# Patient Record
Sex: Female | Born: 1940 | Race: White | Hispanic: No | Smoking: Never smoker
Health system: Southern US, Community
[De-identification: ages and names within clinical notes are randomized; demographics above are authoritative.]

## PROBLEM LIST (undated history)

## (undated) DIAGNOSIS — E039 Hypothyroidism, unspecified: Secondary | ICD-10-CM

## (undated) DIAGNOSIS — J189 Pneumonia, unspecified organism: Secondary | ICD-10-CM

## (undated) DIAGNOSIS — F329 Major depressive disorder, single episode, unspecified: Secondary | ICD-10-CM

## (undated) DIAGNOSIS — F32A Depression, unspecified: Secondary | ICD-10-CM

## (undated) DIAGNOSIS — E7800 Pure hypercholesterolemia, unspecified: Secondary | ICD-10-CM

## (undated) DIAGNOSIS — Z8719 Personal history of other diseases of the digestive system: Secondary | ICD-10-CM

## (undated) DIAGNOSIS — R011 Cardiac murmur, unspecified: Secondary | ICD-10-CM

## (undated) DIAGNOSIS — R0600 Dyspnea, unspecified: Secondary | ICD-10-CM

## (undated) DIAGNOSIS — M797 Fibromyalgia: Secondary | ICD-10-CM

## (undated) DIAGNOSIS — K589 Irritable bowel syndrome without diarrhea: Secondary | ICD-10-CM

## (undated) DIAGNOSIS — H9190 Unspecified hearing loss, unspecified ear: Secondary | ICD-10-CM

## (undated) DIAGNOSIS — K219 Gastro-esophageal reflux disease without esophagitis: Secondary | ICD-10-CM

## (undated) DIAGNOSIS — I10 Essential (primary) hypertension: Secondary | ICD-10-CM

## (undated) DIAGNOSIS — M810 Age-related osteoporosis without current pathological fracture: Secondary | ICD-10-CM

## (undated) DIAGNOSIS — M1990 Unspecified osteoarthritis, unspecified site: Secondary | ICD-10-CM

## (undated) DIAGNOSIS — D649 Anemia, unspecified: Secondary | ICD-10-CM

## (undated) HISTORY — PX: CHOLECYSTECTOMY: SHX55

## (undated) HISTORY — PX: ABDOMINAL HYSTERECTOMY: SHX81

---

## 2005-02-15 ENCOUNTER — Ambulatory Visit: Payer: Self-pay | Admitting: Nurse Practitioner

## 2005-11-29 ENCOUNTER — Ambulatory Visit: Payer: Self-pay

## 2006-03-30 ENCOUNTER — Ambulatory Visit: Payer: Self-pay | Admitting: Nurse Practitioner

## 2006-07-01 ENCOUNTER — Emergency Department: Payer: Self-pay | Admitting: Emergency Medicine

## 2006-07-02 ENCOUNTER — Inpatient Hospital Stay: Payer: Self-pay

## 2006-07-02 ENCOUNTER — Other Ambulatory Visit: Payer: Self-pay

## 2006-07-20 ENCOUNTER — Ambulatory Visit: Payer: Self-pay

## 2006-08-23 ENCOUNTER — Ambulatory Visit: Payer: Self-pay | Admitting: Internal Medicine

## 2006-08-28 ENCOUNTER — Ambulatory Visit: Payer: Self-pay | Admitting: Internal Medicine

## 2006-10-03 ENCOUNTER — Ambulatory Visit: Payer: Self-pay | Admitting: Gastroenterology

## 2007-01-10 ENCOUNTER — Ambulatory Visit: Payer: Self-pay | Admitting: Nurse Practitioner

## 2008-01-17 ENCOUNTER — Ambulatory Visit: Payer: Self-pay | Admitting: Unknown Physician Specialty

## 2008-03-13 ENCOUNTER — Other Ambulatory Visit: Payer: Self-pay

## 2008-03-13 ENCOUNTER — Ambulatory Visit: Payer: Self-pay | Admitting: Surgery

## 2008-03-23 ENCOUNTER — Ambulatory Visit: Payer: Self-pay | Admitting: Surgery

## 2008-07-07 ENCOUNTER — Ambulatory Visit: Payer: Self-pay | Admitting: Internal Medicine

## 2008-07-13 ENCOUNTER — Ambulatory Visit: Payer: Self-pay | Admitting: Internal Medicine

## 2008-08-10 ENCOUNTER — Inpatient Hospital Stay: Payer: Self-pay | Admitting: Internal Medicine

## 2008-09-06 ENCOUNTER — Inpatient Hospital Stay: Payer: Self-pay | Admitting: Internal Medicine

## 2008-09-07 ENCOUNTER — Ambulatory Visit: Payer: Self-pay | Admitting: Cardiology

## 2009-01-18 ENCOUNTER — Ambulatory Visit: Payer: Self-pay | Admitting: Internal Medicine

## 2009-02-01 ENCOUNTER — Inpatient Hospital Stay: Payer: Self-pay | Admitting: Internal Medicine

## 2009-02-24 ENCOUNTER — Ambulatory Visit: Payer: Self-pay | Admitting: Oncology

## 2009-03-18 LAB — CMP (CANCER CENTER ONLY)
ALT(SGPT): 17 U/L (ref 10–47)
Albumin: 3.8 g/dL (ref 3.3–5.5)
Alkaline Phosphatase: 110 U/L — ABNORMAL HIGH (ref 26–84)
CO2: 27 mEq/L (ref 18–33)
Potassium: 4.3 mEq/L (ref 3.3–4.7)
Sodium: 139 mEq/L (ref 128–145)
Total Bilirubin: 0.5 mg/dl (ref 0.20–1.60)
Total Protein: 7.1 g/dL (ref 6.4–8.1)

## 2009-03-18 LAB — CBC WITH DIFFERENTIAL (CANCER CENTER ONLY)
BASO%: 0.6 % (ref 0.0–2.0)
Eosinophils Absolute: 0 10*3/uL (ref 0.0–0.5)
HCT: 35.2 % (ref 34.8–46.6)
HGB: 12.1 g/dL (ref 11.6–15.9)
LYMPH#: 2.2 10*3/uL (ref 0.9–3.3)
MONO#: 0.4 10*3/uL (ref 0.1–0.9)
NEUT%: 51.1 % (ref 39.6–80.0)
RBC: 4.61 10*6/uL (ref 3.70–5.32)
RDW: 22.1 % — ABNORMAL HIGH (ref 10.5–14.6)
WBC: 5.4 10*3/uL (ref 3.9–10.0)

## 2009-03-18 LAB — MORPHOLOGY - CHCC SATELLITE: Platelet Morphology: NORMAL

## 2009-03-19 LAB — RETICULOCYTES (CHCC)
ABS Retic: 39.2 10*3/uL (ref 19.0–186.0)
RBC.: 4.36 MIL/uL (ref 3.87–5.11)
Retic Ct Pct: 0.9 % (ref 0.4–3.1)

## 2009-03-19 LAB — IRON AND TIBC: %SAT: 15 % — ABNORMAL LOW (ref 20–55)

## 2009-04-26 ENCOUNTER — Ambulatory Visit: Payer: Self-pay | Admitting: Oncology

## 2009-04-28 LAB — CBC WITH DIFFERENTIAL (CANCER CENTER ONLY)
BASO%: 0.6 % (ref 0.0–2.0)
LYMPH#: 2.3 10*3/uL (ref 0.9–3.3)
LYMPH%: 36.9 % (ref 14.0–48.0)
MONO#: 0.3 10*3/uL (ref 0.1–0.9)
NEUT#: 3.6 10*3/uL (ref 1.5–6.5)
Platelets: 254 10*3/uL (ref 145–400)
RBC: 4.08 10*6/uL (ref 3.70–5.32)
RDW: 15.8 % — ABNORMAL HIGH (ref 10.5–14.6)
WBC: 6.3 10*3/uL (ref 3.9–10.0)

## 2009-04-30 LAB — PROTEIN ELECTROPHORESIS, SERUM
Alpha-2-Globulin: 13.7 % — ABNORMAL HIGH (ref 7.1–11.8)
Beta 2: 3.4 % (ref 3.2–6.5)
Beta Globulin: 6.6 % (ref 4.7–7.2)
Total Protein, Serum Electrophoresis: 7.3 g/dL (ref 6.0–8.3)

## 2009-06-21 ENCOUNTER — Ambulatory Visit: Payer: Self-pay | Admitting: Gastroenterology

## 2009-08-30 ENCOUNTER — Ambulatory Visit: Payer: Self-pay | Admitting: Gastroenterology

## 2009-08-31 ENCOUNTER — Ambulatory Visit: Payer: Self-pay | Admitting: Gastroenterology

## 2009-09-02 ENCOUNTER — Ambulatory Visit: Payer: Self-pay | Admitting: Gastroenterology

## 2009-09-07 ENCOUNTER — Ambulatory Visit: Payer: Self-pay | Admitting: Gastroenterology

## 2010-01-08 IMAGING — CR DG CHEST 1V PORT
1 series · 1 of 1 positions shown · non-contrast
Comparison: none

REASON FOR EXAM: Chest Pain
COMMENTS:

[view not recorded]
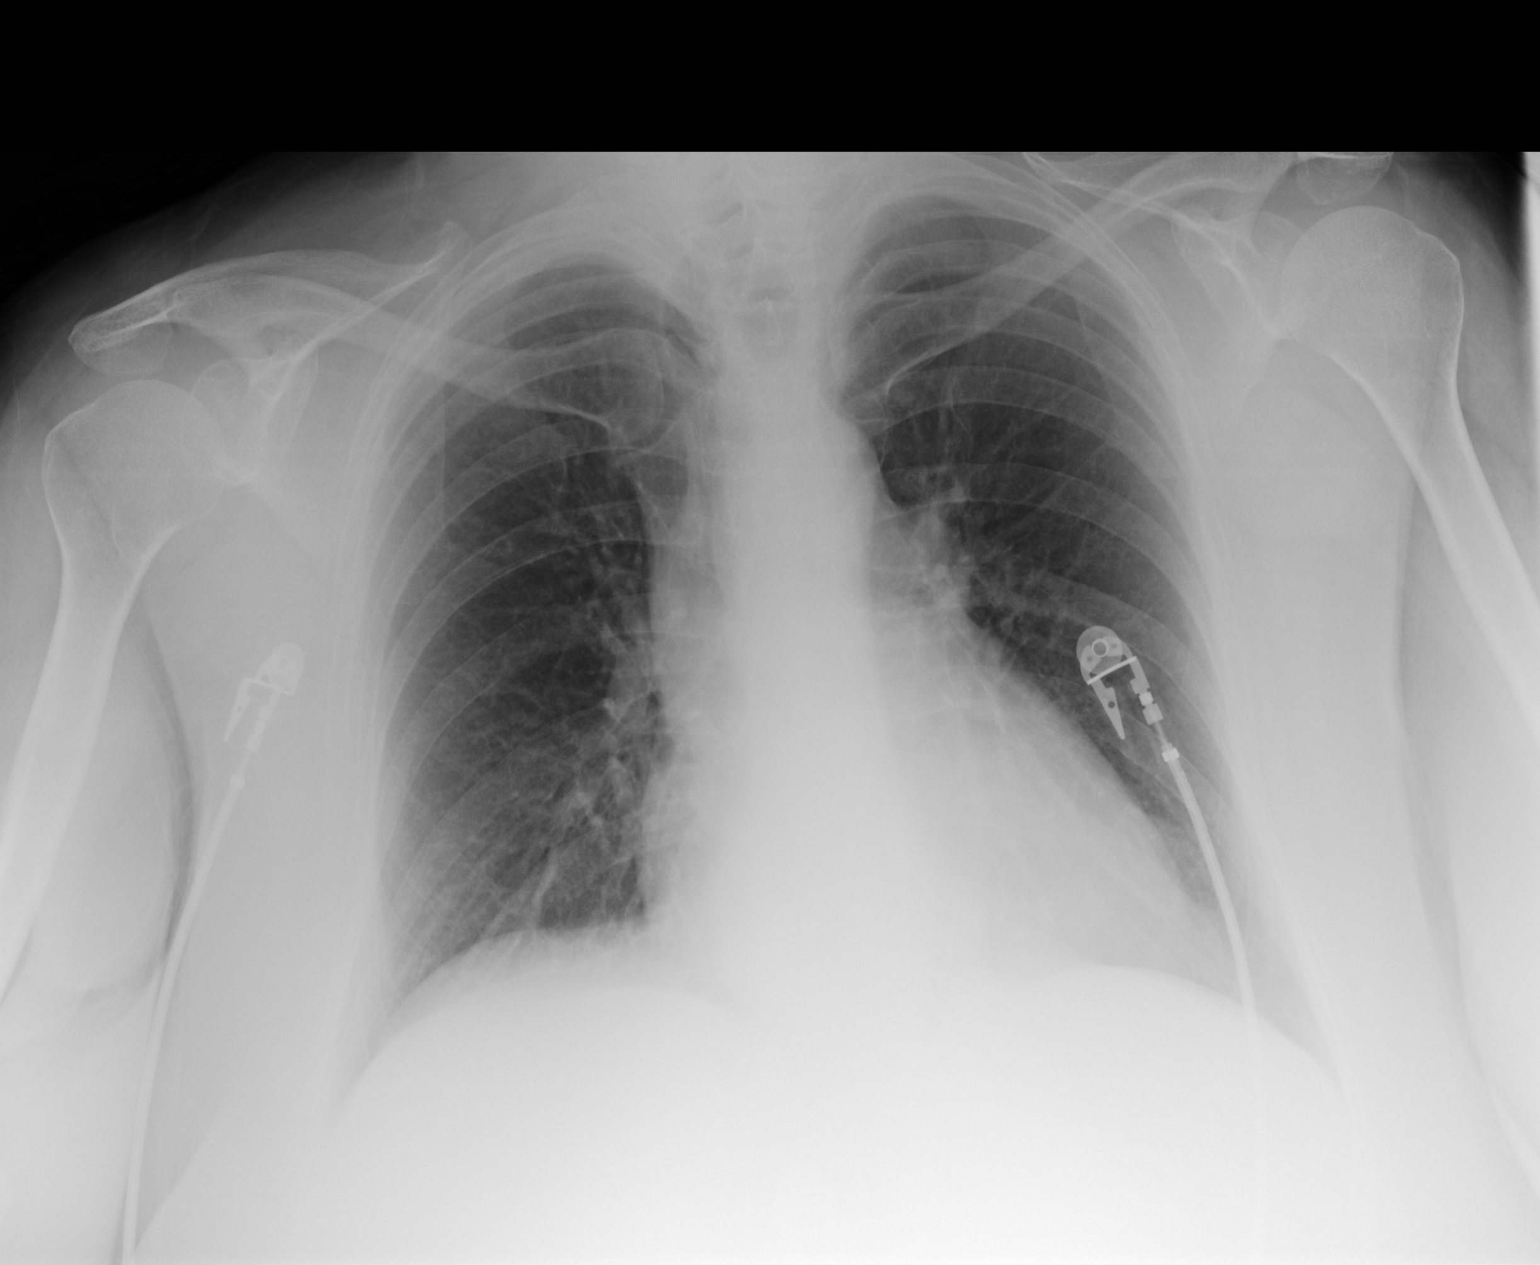

[1 of 1 positions shown; findings below may reference images not displayed]

PROCEDURE:     DXR - DXR PORTABLE CHEST SINGLE VIEW  - January 31, 2009  [DATE]

RESULT:     Comparison is made to the exam of the [DATE].

Cardiac monitoring electrodes are present. The lungs are clear. The heart
and pulmonary vessels are normal. The bony and mediastinal structures are
unremarkable. There is no effusion. There is no pneumothorax or evidence of
congestive failure.
IMPRESSION: No acute cardiopulmonary disease.

## 2010-02-04 ENCOUNTER — Ambulatory Visit: Payer: Self-pay | Admitting: Internal Medicine

## 2010-04-18 ENCOUNTER — Ambulatory Visit: Payer: Self-pay | Admitting: Oncology

## 2010-07-20 ENCOUNTER — Ambulatory Visit: Payer: Self-pay | Admitting: Ophthalmology

## 2010-07-29 ENCOUNTER — Ambulatory Visit: Payer: Self-pay | Admitting: Cardiovascular Disease

## 2010-08-01 ENCOUNTER — Ambulatory Visit: Payer: Self-pay | Admitting: Ophthalmology

## 2011-02-07 ENCOUNTER — Ambulatory Visit: Payer: Self-pay | Admitting: Internal Medicine

## 2012-01-01 ENCOUNTER — Emergency Department: Payer: Self-pay | Admitting: *Deleted

## 2012-01-01 LAB — COMPREHENSIVE METABOLIC PANEL
Anion Gap: 13 (ref 7–16)
Calcium, Total: 8.8 mg/dL (ref 8.5–10.1)
Co2: 18 mmol/L — ABNORMAL LOW (ref 21–32)
EGFR (African American): 55 — ABNORMAL LOW
EGFR (Non-African Amer.): 45 — ABNORMAL LOW
Glucose: 96 mg/dL (ref 65–99)
Osmolality: 293 (ref 275–301)
Potassium: 4.5 mmol/L (ref 3.5–5.1)
SGOT(AST): 17 U/L (ref 15–37)
Sodium: 146 mmol/L — ABNORMAL HIGH (ref 136–145)

## 2012-01-01 LAB — URINALYSIS, COMPLETE
Blood: NEGATIVE
Ketone: NEGATIVE
Ph: 5 (ref 4.5–8.0)
Protein: 30
RBC,UR: 3 /HPF (ref 0–5)

## 2012-01-01 LAB — CBC
MCHC: 33.1 g/dL (ref 32.0–36.0)
RDW: 12.7 % (ref 11.5–14.5)
WBC: 6.3 10*3/uL (ref 3.6–11.0)

## 2012-01-18 ENCOUNTER — Inpatient Hospital Stay: Payer: Self-pay | Admitting: Internal Medicine

## 2012-01-18 LAB — CBC
HCT: 28.7 % — ABNORMAL LOW (ref 35.0–47.0)
HGB: 9.7 g/dL — ABNORMAL LOW (ref 12.0–16.0)
MCH: 31.5 pg (ref 26.0–34.0)
MCHC: 33.8 g/dL (ref 32.0–36.0)
RBC: 3.07 10*6/uL — ABNORMAL LOW (ref 3.80–5.20)
WBC: 7.6 10*3/uL (ref 3.6–11.0)

## 2012-01-18 LAB — COMPREHENSIVE METABOLIC PANEL
Albumin: 3 g/dL — ABNORMAL LOW (ref 3.4–5.0)
Alkaline Phosphatase: 86 U/L (ref 50–136)
Bilirubin,Total: 0.4 mg/dL (ref 0.2–1.0)
Calcium, Total: 7.5 mg/dL — ABNORMAL LOW (ref 8.5–10.1)
Creatinine: 4.65 mg/dL — ABNORMAL HIGH (ref 0.60–1.30)
EGFR (African American): 12 — ABNORMAL LOW
SGOT(AST): 11 U/L — ABNORMAL LOW (ref 15–37)
SGPT (ALT): 10 U/L — ABNORMAL LOW
Sodium: 138 mmol/L (ref 136–145)
Total Protein: 6.2 g/dL — ABNORMAL LOW (ref 6.4–8.2)

## 2012-01-18 LAB — TROPONIN I: Troponin-I: <0.02 ng/mL

## 2012-01-18 LAB — URINALYSIS, COMPLETE
Bilirubin,UR: NEGATIVE
Blood: NEGATIVE
Hyaline Cast: 11
Ketone: NEGATIVE
Nitrite: NEGATIVE
Ph: 5 (ref 4.5–8.0)
Protein: NEGATIVE
RBC,UR: 1 /HPF (ref 0–5)
Specific Gravity: 1.012 (ref 1.003–1.030)
Squamous Epithelial: NONE SEEN

## 2012-01-18 LAB — CK TOTAL AND CKMB (NOT AT ARMC)
CK, Total: 25 U/L (ref 21–215)
CK-MB: 0.7 ng/mL (ref 0.5–3.6)

## 2012-01-18 LAB — LIPASE, BLOOD: Lipase: 51 U/L — ABNORMAL LOW (ref 73–393)

## 2012-01-19 LAB — CBC WITH DIFFERENTIAL/PLATELET
Basophil #: 0 10*3/uL (ref 0.0–0.1)
Basophil %: 0.5 %
Eosinophil #: 0 10*3/uL (ref 0.0–0.7)
HGB: 8.9 g/dL — ABNORMAL LOW (ref 12.0–16.0)
Lymphocyte %: 18.2 %
MCH: 31.2 pg (ref 26.0–34.0)
MCHC: 33.1 g/dL (ref 32.0–36.0)
Monocyte #: 0.4 10*3/uL (ref 0.0–0.7)
Neutrophil %: 76.8 %
Platelet: 153 10*3/uL (ref 150–440)
RBC: 2.86 10*6/uL — ABNORMAL LOW (ref 3.80–5.20)
WBC: 9.7 10*3/uL (ref 3.6–11.0)

## 2012-01-19 LAB — BASIC METABOLIC PANEL
Anion Gap: 19 — ABNORMAL HIGH (ref 7–16)
Anion Gap: 10 (ref 7–16)
Calcium, Total: 7 mg/dL — CL (ref 8.5–10.1)
Calcium, Total: 6.9 mg/dL — CL (ref 8.5–10.1)
Chloride: 118 mmol/L — ABNORMAL HIGH (ref 98–107)
Chloride: 113 mmol/L — ABNORMAL HIGH (ref 98–107)
Co2: 11 mmol/L — ABNORMAL LOW (ref 21–32)
Co2: 20 mmol/L — ABNORMAL LOW (ref 21–32)
EGFR (African American): 25 — ABNORMAL LOW
EGFR (Non-African Amer.): 21 — ABNORMAL LOW
Glucose: 90 mg/dL (ref 65–99)
Osmolality: 301 (ref 275–301)
Osmolality: 292 (ref 275–301)
Potassium: 3.6 mmol/L (ref 3.5–5.1)

## 2012-01-19 LAB — CLOSTRIDIUM DIFFICILE BY PCR

## 2012-01-20 LAB — CBC WITH DIFFERENTIAL/PLATELET
Basophil #: 0 10*3/uL (ref 0.0–0.1)
Basophil %: 0.4 %
Eosinophil #: 0 10*3/uL (ref 0.0–0.7)
Eosinophil %: 0 %
HCT: 24.8 % — ABNORMAL LOW (ref 35.0–47.0)
HGB: 8.4 g/dL — ABNORMAL LOW (ref 12.0–16.0)
Lymphocyte #: 1.7 10*3/uL (ref 1.0–3.6)
Lymphocyte %: 19.6 %
MCH: 31 pg (ref 26.0–34.0)
MCHC: 33.7 g/dL (ref 32.0–36.0)
MCV: 92 fL (ref 80–100)
Monocyte #: 0.5 10*3/uL (ref 0.0–0.7)
Monocyte %: 5.1 %
Neutrophil #: 6.7 10*3/uL — ABNORMAL HIGH (ref 1.4–6.5)
Neutrophil %: 74.9 %
Platelet: 123 10*3/uL — ABNORMAL LOW (ref 150–440)
RBC: 2.7 10*6/uL — ABNORMAL LOW (ref 3.80–5.20)
RDW: 11.7 % (ref 11.5–14.5)
WBC: 8.9 10*3/uL (ref 3.6–11.0)

## 2012-01-20 LAB — MAGNESIUM
Magnesium: 0.7 mg/dL — ABNORMAL LOW
Magnesium: 1.4 mg/dL — ABNORMAL LOW

## 2012-01-20 LAB — BASIC METABOLIC PANEL
Anion Gap: 11 (ref 7–16)
BUN: 12 mg/dL (ref 7–18)
Calcium, Total: 6.6 mg/dL — CL (ref 8.5–10.1)
Chloride: 108 mmol/L — ABNORMAL HIGH (ref 98–107)
Co2: 26 mmol/L (ref 21–32)
Creatinine: 1.03 mg/dL (ref 0.60–1.30)
EGFR (African American): >60
EGFR (Non-African Amer.): 56 — ABNORMAL LOW
Glucose: 117 mg/dL — ABNORMAL HIGH (ref 65–99)
Osmolality: 289 (ref 275–301)
Potassium: 3.4 mmol/L — ABNORMAL LOW (ref 3.5–5.1)
Sodium: 145 mmol/L (ref 136–145)

## 2012-01-20 LAB — PROTIME-INR
INR: 1.4
Prothrombin Time: 17.9 secs — ABNORMAL HIGH (ref 11.5–14.7)

## 2012-01-20 LAB — APTT: Activated PTT: 43.3 secs — ABNORMAL HIGH (ref 23.6–35.9)

## 2012-01-20 LAB — PHOSPHORUS
Phosphorus: 1 mg/dL — CL (ref 2.5–4.9)
Phosphorus: 2.6 mg/dL (ref 2.5–4.9)

## 2012-01-20 LAB — POTASSIUM: Potassium: 2.9 mmol/L — ABNORMAL LOW (ref 3.5–5.1)

## 2012-01-20 LAB — CALCIUM: Calcium, Total: 6.8 mg/dL — CL (ref 8.5–10.1)

## 2012-01-20 LAB — WBCS, STOOL

## 2012-01-21 LAB — CBC WITH DIFFERENTIAL/PLATELET
Basophil #: 0 10*3/uL (ref 0.0–0.1)
Basophil %: 0.3 %
Eosinophil #: 0 10*3/uL (ref 0.0–0.7)
Eosinophil %: 0.1 %
HCT: 25.5 % — ABNORMAL LOW (ref 35.0–47.0)
HGB: 8.7 g/dL — ABNORMAL LOW (ref 12.0–16.0)
Lymphocyte #: 1.2 10*3/uL (ref 1.0–3.6)
Lymphocyte %: 14.7 %
MCH: 31.6 pg (ref 26.0–34.0)
MCHC: 34 g/dL (ref 32.0–36.0)
MCV: 93 fL (ref 80–100)
Monocyte #: 0.4 10*3/uL (ref 0.0–0.7)
Monocyte %: 5.2 %
Neutrophil #: 6.8 10*3/uL — ABNORMAL HIGH (ref 1.4–6.5)
Neutrophil %: 79.7 %
Platelet: 119 10*3/uL — ABNORMAL LOW (ref 150–440)
RBC: 2.74 10*6/uL — ABNORMAL LOW (ref 3.80–5.20)
RDW: 12.5 % (ref 11.5–14.5)
WBC: 8.5 10*3/uL (ref 3.6–11.0)

## 2012-01-21 LAB — COMPREHENSIVE METABOLIC PANEL
Albumin: 2.3 g/dL — ABNORMAL LOW (ref 3.4–5.0)
Alkaline Phosphatase: 76 U/L (ref 50–136)
Anion Gap: 10 (ref 7–16)
BUN: 9 mg/dL (ref 7–18)
Bilirubin,Total: 0.3 mg/dL (ref 0.2–1.0)
Calcium, Total: 6.8 mg/dL — CL (ref 8.5–10.1)
Chloride: 106 mmol/L (ref 98–107)
Co2: 28 mmol/L (ref 21–32)
Creatinine: 0.98 mg/dL (ref 0.60–1.30)
EGFR (African American): >60
EGFR (Non-African Amer.): 60 — ABNORMAL LOW
Glucose: 122 mg/dL — ABNORMAL HIGH (ref 65–99)
Osmolality: 287 (ref 275–301)
Potassium: 3.4 mmol/L — ABNORMAL LOW (ref 3.5–5.1)
SGOT(AST): 17 U/L (ref 15–37)
SGPT (ALT): 7 U/L — ABNORMAL LOW
Sodium: 144 mmol/L (ref 136–145)
Total Protein: 5.6 g/dL — ABNORMAL LOW (ref 6.4–8.2)

## 2012-01-21 LAB — MAGNESIUM: Magnesium: 1.7 mg/dL — ABNORMAL LOW

## 2012-01-21 LAB — PHOSPHORUS: Phosphorus: 1.5 mg/dL — ABNORMAL LOW (ref 2.5–4.9)

## 2012-01-23 LAB — CBC WITH DIFFERENTIAL/PLATELET
Basophil %: 0.4 %
Eosinophil #: 0 10*3/uL (ref 0.0–0.7)
Eosinophil %: 0.1 %
HCT: 25.6 % — ABNORMAL LOW (ref 35.0–47.0)
Lymphocyte #: 1.5 10*3/uL (ref 1.0–3.6)
MCH: 31.3 pg (ref 26.0–34.0)
MCHC: 33.3 g/dL (ref 32.0–36.0)
Monocyte #: 0.6 10*3/uL (ref 0.0–0.7)
Monocyte %: 7.1 %
Neutrophil #: 6.2 10*3/uL (ref 1.4–6.5)
Platelet: 131 10*3/uL — ABNORMAL LOW (ref 150–440)
RBC: 2.72 10*6/uL — ABNORMAL LOW (ref 3.80–5.20)

## 2012-01-23 LAB — PATHOLOGY REPORT

## 2012-01-23 LAB — BASIC METABOLIC PANEL
Anion Gap: 9 (ref 7–16)
BUN: 9 mg/dL (ref 7–18)
Calcium, Total: 7.8 mg/dL — ABNORMAL LOW (ref 8.5–10.1)
EGFR (African American): >60
Osmolality: 282 (ref 275–301)
Potassium: 4 mmol/L (ref 3.5–5.1)

## 2012-01-23 LAB — MAGNESIUM: Magnesium: 1.4 mg/dL — ABNORMAL LOW

## 2012-01-24 LAB — BASIC METABOLIC PANEL
Anion Gap: 8 (ref 7–16)
Calcium, Total: 8.1 mg/dL — ABNORMAL LOW (ref 8.5–10.1)
Chloride: 102 mmol/L (ref 98–107)
Creatinine: 0.84 mg/dL (ref 0.60–1.30)
EGFR (Non-African Amer.): >60
Glucose: 121 mg/dL — ABNORMAL HIGH (ref 65–99)
Osmolality: 284 (ref 275–301)

## 2012-01-24 LAB — CULTURE, BLOOD (SINGLE)

## 2012-01-25 LAB — BASIC METABOLIC PANEL
Anion Gap: 10 (ref 7–16)
Co2: 33 mmol/L — ABNORMAL HIGH (ref 21–32)
EGFR (Non-African Amer.): >60
Osmolality: 286 (ref 275–301)
Potassium: 3.7 mmol/L (ref 3.5–5.1)
Sodium: 143 mmol/L (ref 136–145)

## 2012-04-12 ENCOUNTER — Ambulatory Visit: Payer: Self-pay | Admitting: Internal Medicine

## 2012-12-25 ENCOUNTER — Emergency Department: Payer: Self-pay | Admitting: Emergency Medicine

## 2013-02-23 ENCOUNTER — Emergency Department: Payer: Self-pay | Admitting: Emergency Medicine

## 2013-02-23 LAB — URINALYSIS, COMPLETE
Bacteria: NONE SEEN
Blood: NEGATIVE
Glucose,UR: NEGATIVE mg/dL (ref 0–75)
Hyaline Cast: 5
Ketone: NEGATIVE
Leukocyte Esterase: NEGATIVE
Nitrite: NEGATIVE
Ph: 5 (ref 4.5–8.0)
Protein: NEGATIVE
RBC,UR: 1 /HPF (ref 0–5)
Squamous Epithelial: NONE SEEN

## 2013-02-23 LAB — BASIC METABOLIC PANEL
Anion Gap: 7 (ref 7–16)
Calcium, Total: 8.8 mg/dL (ref 8.5–10.1)
Co2: 25 mmol/L (ref 21–32)
Creatinine: 1.55 mg/dL — ABNORMAL HIGH (ref 0.60–1.30)
Glucose: 94 mg/dL (ref 65–99)
Osmolality: 272 (ref 275–301)
Potassium: 4.4 mmol/L (ref 3.5–5.1)
Sodium: 132 mmol/L — ABNORMAL LOW (ref 136–145)

## 2013-02-23 LAB — CBC
HCT: 37.3 % (ref 35.0–47.0)
HGB: 12.7 g/dL (ref 12.0–16.0)
MCH: 31.2 pg (ref 26.0–34.0)
MCHC: 34.1 g/dL (ref 32.0–36.0)
MCV: 92 fL (ref 80–100)
Platelet: 216 10*3/uL (ref 150–440)
RBC: 4.07 10*6/uL (ref 3.80–5.20)
WBC: 16 10*3/uL — ABNORMAL HIGH (ref 3.6–11.0)

## 2013-02-23 LAB — PRO B NATRIURETIC PEPTIDE: B-Type Natriuretic Peptide: 338 pg/mL — ABNORMAL HIGH (ref 0–125)

## 2013-04-24 ENCOUNTER — Ambulatory Visit: Payer: Self-pay | Admitting: Physical Medicine and Rehabilitation

## 2013-04-30 ENCOUNTER — Inpatient Hospital Stay: Payer: Self-pay | Admitting: Internal Medicine

## 2013-04-30 LAB — COMPREHENSIVE METABOLIC PANEL
Albumin: 3 g/dL — ABNORMAL LOW (ref 3.4–5.0)
Alkaline Phosphatase: 91 U/L (ref 50–136)
Anion Gap: 8 (ref 7–16)
Calcium, Total: 8.8 mg/dL (ref 8.5–10.1)
Chloride: 114 mmol/L — ABNORMAL HIGH (ref 98–107)
Creatinine: 1.21 mg/dL (ref 0.60–1.30)
EGFR (Non-African Amer.): 45 — ABNORMAL LOW
Glucose: 87 mg/dL (ref 65–99)
Osmolality: 302 (ref 275–301)
Potassium: 4.7 mmol/L (ref 3.5–5.1)
SGOT(AST): 18 U/L (ref 15–37)
SGPT (ALT): 16 U/L (ref 12–78)
Sodium: 148 mmol/L — ABNORMAL HIGH (ref 136–145)

## 2013-04-30 LAB — CBC WITH DIFFERENTIAL/PLATELET
Basophil #: 0.1 10*3/uL (ref 0.0–0.1)
Basophil %: 1.2 %
Eosinophil #: 0.4 10*3/uL (ref 0.0–0.7)
HGB: 11.1 g/dL — ABNORMAL LOW (ref 12.0–16.0)
Lymphocyte #: 3.3 10*3/uL (ref 1.0–3.6)
Lymphocyte %: 28.2 %
MCHC: 32 g/dL (ref 32.0–36.0)
MCV: 93 fL (ref 80–100)
Monocyte #: 0.8 x10 3/mm (ref 0.2–0.9)
Monocyte %: 6.9 %
Neutrophil %: 60 %

## 2013-04-30 LAB — TROPONIN I: Troponin-I: <0.02 ng/mL

## 2013-04-30 LAB — URINALYSIS, COMPLETE
Bilirubin,UR: NEGATIVE
Blood: NEGATIVE
Leukocyte Esterase: NEGATIVE
Nitrite: NEGATIVE
Ph: 5 (ref 4.5–8.0)
Protein: NEGATIVE
Specific Gravity: 1.019 (ref 1.003–1.030)
Squamous Epithelial: <1

## 2013-04-30 LAB — CK TOTAL AND CKMB (NOT AT ARMC)
CK, Total: 45 U/L (ref 21–215)
CK-MB: 1.7 ng/mL (ref 0.5–3.6)

## 2013-05-02 LAB — CBC WITH DIFFERENTIAL/PLATELET
Basophil #: 0.1 10*3/uL (ref 0.0–0.1)
Basophil %: 1.1 %
Eosinophil #: 0.6 10*3/uL (ref 0.0–0.7)
Eosinophil %: 7.6 %
HCT: 26.6 % — ABNORMAL LOW (ref 35.0–47.0)
Lymphocyte #: 2 10*3/uL (ref 1.0–3.6)
Lymphocyte %: 24.5 %
MCH: 32.1 pg (ref 26.0–34.0)
MCV: 93 fL (ref 80–100)
Monocyte #: 0.5 x10 3/mm (ref 0.2–0.9)
Monocyte %: 5.7 %
Neutrophil #: 5 10*3/uL (ref 1.4–6.5)
Platelet: 157 10*3/uL (ref 150–440)

## 2013-05-02 LAB — BASIC METABOLIC PANEL
Anion Gap: 7 (ref 7–16)
Calcium, Total: 8.3 mg/dL — ABNORMAL LOW (ref 8.5–10.1)
Chloride: 113 mmol/L — ABNORMAL HIGH (ref 98–107)
Co2: 25 mmol/L (ref 21–32)
Potassium: 4 mmol/L (ref 3.5–5.1)
Sodium: 145 mmol/L (ref 136–145)

## 2013-05-03 LAB — CBC WITH DIFFERENTIAL/PLATELET
Basophil #: 0.1 10*3/uL (ref 0.0–0.1)
Eosinophil #: 0.7 10*3/uL (ref 0.0–0.7)
Eosinophil %: 7 %
HGB: 9.2 g/dL — ABNORMAL LOW (ref 12.0–16.0)
Lymphocyte #: 2.3 10*3/uL (ref 1.0–3.6)
Lymphocyte %: 24.8 %
MCHC: 34.5 g/dL (ref 32.0–36.0)
MCV: 91 fL (ref 80–100)
Monocyte #: 0.6 x10 3/mm (ref 0.2–0.9)
Monocyte %: 6.5 %
Neutrophil #: 5.7 10*3/uL (ref 1.4–6.5)
RBC: 2.93 10*6/uL — ABNORMAL LOW (ref 3.80–5.20)
RDW: 13.5 % (ref 11.5–14.5)
WBC: 9.4 10*3/uL (ref 3.6–11.0)

## 2013-05-03 LAB — BASIC METABOLIC PANEL
Anion Gap: 10 (ref 7–16)
BUN: 7 mg/dL (ref 7–18)
Creatinine: 1.03 mg/dL (ref 0.60–1.30)
Glucose: 111 mg/dL — ABNORMAL HIGH (ref 65–99)
Osmolality: 272 (ref 275–301)
Sodium: 137 mmol/L (ref 136–145)

## 2013-05-03 LAB — OCCULT BLOOD X 1 CARD TO LAB, STOOL: Occult Blood, Feces: NEGATIVE

## 2013-05-04 LAB — BASIC METABOLIC PANEL
Anion Gap: 7 (ref 7–16)
Calcium, Total: 8.4 mg/dL — ABNORMAL LOW (ref 8.5–10.1)
Chloride: 105 mmol/L (ref 98–107)
Co2: 27 mmol/L (ref 21–32)
Creatinine: 1.12 mg/dL (ref 0.60–1.30)
EGFR (Non-African Amer.): 49 — ABNORMAL LOW
Glucose: 92 mg/dL (ref 65–99)
Osmolality: 274 (ref 275–301)
Potassium: 3.4 mmol/L — ABNORMAL LOW (ref 3.5–5.1)
Sodium: 139 mmol/L (ref 136–145)

## 2013-05-04 LAB — CBC WITH DIFFERENTIAL/PLATELET
Basophil %: 0.9 %
Lymphocyte #: 2.6 10*3/uL (ref 1.0–3.6)
Lymphocyte %: 29 %
MCH: 32 pg (ref 26.0–34.0)
Monocyte #: 0.6 x10 3/mm (ref 0.2–0.9)
Monocyte %: 6.7 %
Neutrophil %: 55.1 %
RBC: 3.11 10*6/uL — ABNORMAL LOW (ref 3.80–5.20)
WBC: 8.8 10*3/uL (ref 3.6–11.0)

## 2013-05-05 LAB — PATHOLOGY REPORT

## 2013-05-27 ENCOUNTER — Inpatient Hospital Stay: Payer: Self-pay | Admitting: Internal Medicine

## 2013-05-27 LAB — COMPREHENSIVE METABOLIC PANEL
Albumin: 3.7 g/dL (ref 3.4–5.0)
Alkaline Phosphatase: 110 U/L (ref 50–136)
BUN: 15 mg/dL (ref 7–18)
Bilirubin,Total: 0.5 mg/dL (ref 0.2–1.0)
Chloride: 110 mmol/L — ABNORMAL HIGH (ref 98–107)
Co2: 19 mmol/L — ABNORMAL LOW (ref 21–32)
Creatinine: 1.21 mg/dL (ref 0.60–1.30)
Glucose: 92 mg/dL (ref 65–99)
Osmolality: 284 (ref 275–301)
SGOT(AST): 26 U/L (ref 15–37)
SGPT (ALT): 17 U/L (ref 12–78)
Sodium: 142 mmol/L (ref 136–145)

## 2013-05-27 LAB — CBC
HCT: 39.5 % (ref 35.0–47.0)
HGB: 12.7 g/dL (ref 12.0–16.0)
MCH: 28.9 pg (ref 26.0–34.0)
MCV: 90 fL (ref 80–100)
RBC: 4.38 10*6/uL (ref 3.80–5.20)
RDW: 13.3 % (ref 11.5–14.5)

## 2013-05-27 LAB — URINALYSIS, COMPLETE
Bacteria: NONE SEEN
Glucose,UR: NEGATIVE mg/dL (ref 0–75)
Leukocyte Esterase: NEGATIVE
Nitrite: NEGATIVE
Ph: 5 (ref 4.5–8.0)
Squamous Epithelial: <1
WBC UR: NONE SEEN /HPF (ref 0–5)

## 2013-05-28 LAB — LIPID PANEL
Cholesterol: 182 mg/dL (ref 0–200)
HDL Cholesterol: 37 mg/dL — ABNORMAL LOW (ref 40–60)
Ldl Cholesterol, Calc: 120 mg/dL — ABNORMAL HIGH (ref 0–100)
Triglycerides: 124 mg/dL (ref 0–200)

## 2013-05-28 LAB — HEMOGLOBIN A1C: Hemoglobin A1C: 4.5 % (ref 4.2–6.3)

## 2013-05-29 LAB — MAGNESIUM: Magnesium: 1.9 mg/dL

## 2013-05-29 LAB — AMMONIA: Ammonia, Plasma: <25 mcmol/L (ref 11–32)

## 2013-05-29 LAB — FOLATE: Folic Acid: 12.4 ng/mL (ref 3.1–100.0)

## 2013-05-29 LAB — BASIC METABOLIC PANEL
BUN: 13 mg/dL (ref 7–18)
Co2: 18 mmol/L — ABNORMAL LOW (ref 21–32)
Creatinine: 1.04 mg/dL (ref 0.60–1.30)
EGFR (African American): >60
EGFR (Non-African Amer.): 54 — ABNORMAL LOW
Potassium: 3.9 mmol/L (ref 3.5–5.1)

## 2013-05-29 LAB — TSH: Thyroid Stimulating Horm: 2.18 u[IU]/mL

## 2013-05-31 LAB — BASIC METABOLIC PANEL
BUN: 6 mg/dL — ABNORMAL LOW (ref 7–18)
Calcium, Total: 8.5 mg/dL (ref 8.5–10.1)
Chloride: 116 mmol/L — ABNORMAL HIGH (ref 98–107)
Co2: 20 mmol/L — ABNORMAL LOW (ref 21–32)
Creatinine: 0.86 mg/dL (ref 0.60–1.30)
Osmolality: 286 (ref 275–301)
Potassium: 3.3 mmol/L — ABNORMAL LOW (ref 3.5–5.1)
Sodium: 145 mmol/L (ref 136–145)

## 2013-06-01 LAB — CBC WITH DIFFERENTIAL/PLATELET
Basophil %: 1 %
Eosinophil #: 0.3 10*3/uL (ref 0.0–0.7)
Eosinophil %: 4.3 %
HGB: 10.2 g/dL — ABNORMAL LOW (ref 12.0–16.0)
MCHC: 34.5 g/dL (ref 32.0–36.0)
MCV: 89 fL (ref 80–100)
Monocyte %: 5.6 %
Neutrophil %: 56.9 %
Platelet: 212 10*3/uL (ref 150–440)
RBC: 3.32 10*6/uL — ABNORMAL LOW (ref 3.80–5.20)
WBC: 6.2 10*3/uL (ref 3.6–11.0)

## 2013-06-01 LAB — BASIC METABOLIC PANEL
Anion Gap: 8 (ref 7–16)
BUN: 4 mg/dL — ABNORMAL LOW (ref 7–18)
Chloride: 116 mmol/L — ABNORMAL HIGH (ref 98–107)
Co2: 23 mmol/L (ref 21–32)
Creatinine: 0.8 mg/dL (ref 0.60–1.30)
Glucose: 112 mg/dL — ABNORMAL HIGH (ref 65–99)
Potassium: 3.4 mmol/L — ABNORMAL LOW (ref 3.5–5.1)
Sodium: 147 mmol/L — ABNORMAL HIGH (ref 136–145)

## 2013-06-01 LAB — MAGNESIUM: Magnesium: 1.3 mg/dL — ABNORMAL LOW

## 2013-06-02 LAB — CBC WITH DIFFERENTIAL/PLATELET
Basophil #: 0.1 10*3/uL (ref 0.0–0.1)
Basophil %: 1.3 %
HGB: 10.8 g/dL — ABNORMAL LOW (ref 12.0–16.0)
Lymphocyte #: 1.6 10*3/uL (ref 1.0–3.6)
MCH: 31.4 pg (ref 26.0–34.0)
MCV: 90 fL (ref 80–100)
Monocyte #: 0.4 x10 3/mm (ref 0.2–0.9)
Monocyte %: 6.5 %
Neutrophil #: 4.3 10*3/uL (ref 1.4–6.5)
Neutrophil %: 63.5 %
Platelet: 220 10*3/uL (ref 150–440)
RBC: 3.44 10*6/uL — ABNORMAL LOW (ref 3.80–5.20)
RDW: 13.2 % (ref 11.5–14.5)
WBC: 6.8 10*3/uL (ref 3.6–11.0)

## 2013-06-02 LAB — BASIC METABOLIC PANEL
Anion Gap: 6 — ABNORMAL LOW (ref 7–16)
BUN: 4 mg/dL — ABNORMAL LOW (ref 7–18)
Glucose: 113 mg/dL — ABNORMAL HIGH (ref 65–99)
Osmolality: 283 (ref 275–301)
Potassium: 3.2 mmol/L — ABNORMAL LOW (ref 3.5–5.1)
Sodium: 143 mmol/L (ref 136–145)

## 2013-06-03 LAB — BASIC METABOLIC PANEL
Chloride: 112 mmol/L — ABNORMAL HIGH (ref 98–107)
EGFR (African American): >60
EGFR (Non-African Amer.): >60
Osmolality: 279 (ref 275–301)
Potassium: 3.5 mmol/L (ref 3.5–5.1)

## 2013-06-05 ENCOUNTER — Telehealth: Payer: Self-pay

## 2013-06-05 NOTE — Telephone Encounter (Signed)
[facility] nurse [facility]  Said question if pt has shingles; red raise area on pt rt knee, nurse thinks shingles. Pt has had shingles before. Advised if pt in pain have evaluated at [facility] tonight otherwise [facility] will have day shift call early AM for appt.

## 2013-06-06 NOTE — Telephone Encounter (Signed)
I called [facility] at [facility] this AM, spoke with Gregor Hams and she had already spoken with Dr Alphonsus Sias this morning and he prescribed Valtrex.

## 2013-06-06 NOTE — Telephone Encounter (Signed)
I do recommend in office evaluation if able.

## 2013-06-10 DIAGNOSIS — G9340 Encephalopathy, unspecified: Secondary | ICD-10-CM

## 2013-06-10 DIAGNOSIS — I10 Essential (primary) hypertension: Secondary | ICD-10-CM

## 2013-06-10 DIAGNOSIS — F329 Major depressive disorder, single episode, unspecified: Secondary | ICD-10-CM

## 2013-06-25 LAB — COMPREHENSIVE METABOLIC PANEL
Alkaline Phosphatase: 117 U/L (ref 50–136)
Anion Gap: 10 (ref 7–16)
Calcium, Total: 8.5 mg/dL (ref 8.5–10.1)
Chloride: 108 mmol/L — ABNORMAL HIGH (ref 98–107)
Co2: 26 mmol/L (ref 21–32)
EGFR (African American): 54 — ABNORMAL LOW
Glucose: 107 mg/dL — ABNORMAL HIGH (ref 65–99)
Osmolality: 287 (ref 275–301)
Potassium: 3.3 mmol/L — ABNORMAL LOW (ref 3.5–5.1)
SGOT(AST): 46 U/L — ABNORMAL HIGH (ref 15–37)

## 2013-06-25 LAB — CBC
MCHC: 33.7 g/dL (ref 32.0–36.0)
Platelet: 216 10*3/uL (ref 150–440)
RBC: 3.57 10*6/uL — ABNORMAL LOW (ref 3.80–5.20)
RDW: 13.9 % (ref 11.5–14.5)
WBC: 7.5 10*3/uL (ref 3.6–11.0)

## 2013-06-26 ENCOUNTER — Inpatient Hospital Stay: Payer: Self-pay | Admitting: Internal Medicine

## 2013-06-26 LAB — URINALYSIS, COMPLETE
Bilirubin,UR: NEGATIVE
Glucose,UR: 50 mg/dL (ref 0–75)
Nitrite: POSITIVE
Protein: 100
Specific Gravity: 1.018 (ref 1.003–1.030)
WBC UR: 775 /HPF (ref 0–5)

## 2013-06-26 LAB — SALICYLATE LEVEL: Salicylates, Serum: <1.7 mg/dL

## 2013-06-26 LAB — DRUG SCREEN, URINE
Amphetamines, Ur Screen: NEGATIVE (ref ?–1000)
Benzodiazepine, Ur Scrn: POSITIVE (ref ?–200)
Cannabinoid 50 Ng, Ur ~~LOC~~: NEGATIVE (ref ?–50)
MDMA (Ecstasy)Ur Screen: NEGATIVE (ref ?–500)
Opiate, Ur Screen: POSITIVE (ref ?–300)
Tricyclic, Ur Screen: NEGATIVE (ref ?–1000)

## 2013-06-27 LAB — CBC WITH DIFFERENTIAL/PLATELET
Basophil #: 0.1 10*3/uL (ref 0.0–0.1)
Basophil %: 0.8 %
Eosinophil #: 0 10*3/uL (ref 0.0–0.7)
HGB: 10.5 g/dL — ABNORMAL LOW (ref 12.0–16.0)
Lymphocyte #: 2.1 10*3/uL (ref 1.0–3.6)
Lymphocyte %: 21.7 %
MCH: 30.6 pg (ref 26.0–34.0)
MCV: 91 fL (ref 80–100)
Neutrophil %: 71 %
RBC: 3.42 10*6/uL — ABNORMAL LOW (ref 3.80–5.20)
RDW: 14.3 % (ref 11.5–14.5)
WBC: 9.6 10*3/uL (ref 3.6–11.0)

## 2013-06-27 LAB — BASIC METABOLIC PANEL
BUN: 11 mg/dL (ref 7–18)
Calcium, Total: 8.1 mg/dL — ABNORMAL LOW (ref 8.5–10.1)
Chloride: 108 mmol/L — ABNORMAL HIGH (ref 98–107)
Creatinine: 1.06 mg/dL (ref 0.60–1.30)
EGFR (African American): >60
EGFR (Non-African Amer.): 52 — ABNORMAL LOW
Glucose: 111 mg/dL — ABNORMAL HIGH (ref 65–99)
Osmolality: 287 (ref 275–301)
Potassium: 3.1 mmol/L — ABNORMAL LOW (ref 3.5–5.1)
Sodium: 144 mmol/L (ref 136–145)

## 2013-06-27 LAB — TSH: Thyroid Stimulating Horm: 2.39 u[IU]/mL

## 2013-06-29 LAB — BASIC METABOLIC PANEL
Anion Gap: 7 (ref 7–16)
BUN: 9 mg/dL (ref 7–18)
Calcium, Total: 7.6 mg/dL — ABNORMAL LOW (ref 8.5–10.1)
Chloride: 107 mmol/L (ref 98–107)
Creatinine: 1.01 mg/dL (ref 0.60–1.30)
EGFR (African American): >60
Glucose: 111 mg/dL — ABNORMAL HIGH (ref 65–99)
Osmolality: 281 (ref 275–301)
Potassium: 2.9 mmol/L — ABNORMAL LOW (ref 3.5–5.1)

## 2013-06-29 LAB — MAGNESIUM: Magnesium: 1.2 mg/dL — ABNORMAL LOW

## 2013-06-30 ENCOUNTER — Ambulatory Visit: Payer: Self-pay | Admitting: Hospice and Palliative Medicine

## 2013-06-30 LAB — CBC WITH DIFFERENTIAL/PLATELET
Basophil #: 0.1 10*3/uL (ref 0.0–0.1)
Basophil %: 0.8 %
Eosinophil %: 0.2 %
HGB: 12 g/dL (ref 12.0–16.0)
Lymphocyte %: 21.3 %
MCH: 30.4 pg (ref 26.0–34.0)
MCV: 88 fL (ref 80–100)
Monocyte %: 7.9 %
Platelet: 245 10*3/uL (ref 150–440)
RBC: 3.93 10*6/uL (ref 3.80–5.20)
RDW: 13.7 % (ref 11.5–14.5)
WBC: 8.8 10*3/uL (ref 3.6–11.0)

## 2013-06-30 LAB — BASIC METABOLIC PANEL
Anion Gap: 7 (ref 7–16)
Calcium, Total: 7.8 mg/dL — ABNORMAL LOW (ref 8.5–10.1)
Chloride: 104 mmol/L (ref 98–107)
Creatinine: 0.96 mg/dL (ref 0.60–1.30)
EGFR (African American): >60
EGFR (Non-African Amer.): 59 — ABNORMAL LOW
Potassium: 3.3 mmol/L — ABNORMAL LOW (ref 3.5–5.1)
Sodium: 135 mmol/L — ABNORMAL LOW (ref 136–145)

## 2013-06-30 LAB — CLOSTRIDIUM DIFFICILE BY PCR

## 2013-07-01 ENCOUNTER — Ambulatory Visit: Payer: Self-pay | Admitting: Neurology

## 2013-07-01 LAB — BASIC METABOLIC PANEL
Anion Gap: 9 (ref 7–16)
Calcium, Total: 8.2 mg/dL — ABNORMAL LOW (ref 8.5–10.1)
Chloride: 103 mmol/L (ref 98–107)
Creatinine: 0.98 mg/dL (ref 0.60–1.30)
EGFR (African American): >60
Glucose: 126 mg/dL — ABNORMAL HIGH (ref 65–99)
Osmolality: 266 (ref 275–301)
Sodium: 133 mmol/L — ABNORMAL LOW (ref 136–145)

## 2013-07-01 LAB — CBC WITH DIFFERENTIAL/PLATELET
Basophil #: 0.2 10*3/uL — ABNORMAL HIGH (ref 0.0–0.1)
Eosinophil %: 0.2 %
Lymphocyte #: 2.3 10*3/uL (ref 1.0–3.6)
MCHC: 34.3 g/dL (ref 32.0–36.0)
MCV: 88 fL (ref 80–100)
Monocyte #: 1 x10 3/mm — ABNORMAL HIGH (ref 0.2–0.9)
Monocyte %: 8.2 %
Neutrophil %: 72 %
RBC: 4.17 10*6/uL (ref 3.80–5.20)
RDW: 13.9 % (ref 11.5–14.5)
WBC: 12.3 10*3/uL — ABNORMAL HIGH (ref 3.6–11.0)

## 2013-07-06 LAB — PHOSPHORUS: Phosphorus: 3 mg/dL (ref 2.5–4.9)

## 2013-07-06 LAB — BASIC METABOLIC PANEL
Anion Gap: 8 (ref 7–16)
Creatinine: 0.99 mg/dL (ref 0.60–1.30)
EGFR (African American): >60
EGFR (Non-African Amer.): 57 — ABNORMAL LOW
Glucose: 121 mg/dL — ABNORMAL HIGH (ref 65–99)
Osmolality: 247 (ref 275–301)
Potassium: 3.9 mmol/L (ref 3.5–5.1)
Sodium: 124 mmol/L — ABNORMAL LOW (ref 136–145)

## 2013-07-06 LAB — MAGNESIUM
Magnesium: 0.9 mg/dL — ABNORMAL LOW
Magnesium: 2.1 mg/dL

## 2013-07-07 LAB — BASIC METABOLIC PANEL
Anion Gap: 7 (ref 7–16)
BUN: 4 mg/dL — ABNORMAL LOW (ref 7–18)
Chloride: 97 mmol/L — ABNORMAL LOW (ref 98–107)
Creatinine: 1.12 mg/dL (ref 0.60–1.30)
EGFR (African American): 57 — ABNORMAL LOW
Glucose: 108 mg/dL — ABNORMAL HIGH (ref 65–99)
Osmolality: 256 (ref 275–301)
Potassium: 3.6 mmol/L (ref 3.5–5.1)
Sodium: 129 mmol/L — ABNORMAL LOW (ref 136–145)

## 2013-07-08 LAB — BASIC METABOLIC PANEL
Anion Gap: 8 (ref 7–16)
BUN: 6 mg/dL — ABNORMAL LOW (ref 7–18)
Calcium, Total: 8.5 mg/dL (ref 8.5–10.1)
Chloride: 96 mmol/L — ABNORMAL LOW (ref 98–107)
Co2: 25 mmol/L (ref 21–32)
Creatinine: 1.12 mg/dL (ref 0.60–1.30)
EGFR (African American): 57 — ABNORMAL LOW
EGFR (Non-African Amer.): 49 — ABNORMAL LOW
Sodium: 129 mmol/L — ABNORMAL LOW (ref 136–145)

## 2013-07-08 LAB — PHOSPHORUS: Phosphorus: 3.9 mg/dL (ref 2.5–4.9)

## 2013-07-30 ENCOUNTER — Ambulatory Visit: Payer: Self-pay | Admitting: Internal Medicine

## 2013-07-30 ENCOUNTER — Ambulatory Visit: Payer: Self-pay | Admitting: Hospice and Palliative Medicine

## 2013-12-01 ENCOUNTER — Inpatient Hospital Stay: Payer: Self-pay | Admitting: Internal Medicine

## 2013-12-01 LAB — BASIC METABOLIC PANEL
ANION GAP: 2 — AB (ref 7–16)
BUN: 30 mg/dL — ABNORMAL HIGH (ref 7–18)
CALCIUM: 8.5 mg/dL (ref 8.5–10.1)
CHLORIDE: 105 mmol/L (ref 98–107)
CREATININE: 1.5 mg/dL — AB (ref 0.60–1.30)
Co2: 26 mmol/L (ref 21–32)
EGFR (African American): 40 — ABNORMAL LOW
EGFR (Non-African Amer.): 34 — ABNORMAL LOW
Glucose: 147 mg/dL — ABNORMAL HIGH (ref 65–99)
Osmolality: 275 (ref 275–301)
Potassium: 4.8 mmol/L (ref 3.5–5.1)
Sodium: 133 mmol/L — ABNORMAL LOW (ref 136–145)

## 2013-12-01 LAB — CBC
HCT: 29 % — AB (ref 35.0–47.0)
HGB: 9.2 g/dL — AB (ref 12.0–16.0)
MCH: 30 pg (ref 26.0–34.0)
MCHC: 31.9 g/dL — ABNORMAL LOW (ref 32.0–36.0)
MCV: 94 fL (ref 80–100)
Platelet: 174 10*3/uL (ref 150–440)
RBC: 3.09 10*6/uL — ABNORMAL LOW (ref 3.80–5.20)
RDW: 14.3 % (ref 11.5–14.5)
WBC: 20.1 10*3/uL — AB (ref 3.6–11.0)

## 2013-12-01 LAB — TROPONIN I

## 2013-12-02 LAB — CBC WITH DIFFERENTIAL/PLATELET
BASOS PCT: 0.5 %
Basophil #: 0.1 10*3/uL (ref 0.0–0.1)
Eosinophil #: 0.1 10*3/uL (ref 0.0–0.7)
Eosinophil %: 0.8 %
HCT: 24.7 % — AB (ref 35.0–47.0)
HGB: 8.3 g/dL — AB (ref 12.0–16.0)
Lymphocyte #: 1.7 10*3/uL (ref 1.0–3.6)
Lymphocyte %: 12.4 %
MCH: 31.6 pg (ref 26.0–34.0)
MCHC: 33.5 g/dL (ref 32.0–36.0)
MCV: 95 fL (ref 80–100)
MONO ABS: 0.4 x10 3/mm (ref 0.2–0.9)
MONOS PCT: 2.9 %
NEUTROS PCT: 83.4 %
Neutrophil #: 11.1 10*3/uL — ABNORMAL HIGH (ref 1.4–6.5)
Platelet: 137 10*3/uL — ABNORMAL LOW (ref 150–440)
RBC: 2.61 10*6/uL — AB (ref 3.80–5.20)
RDW: 14.6 % — AB (ref 11.5–14.5)
WBC: 13.3 10*3/uL — ABNORMAL HIGH (ref 3.6–11.0)

## 2013-12-02 LAB — BASIC METABOLIC PANEL
ANION GAP: 2 — AB (ref 7–16)
BUN: 24 mg/dL — ABNORMAL HIGH (ref 7–18)
CHLORIDE: 109 mmol/L — AB (ref 98–107)
CO2: 31 mmol/L (ref 21–32)
CREATININE: 1.17 mg/dL (ref 0.60–1.30)
Calcium, Total: 8.3 mg/dL — ABNORMAL LOW (ref 8.5–10.1)
EGFR (African American): 54 — ABNORMAL LOW
GFR CALC NON AF AMER: 47 — AB
Glucose: 105 mg/dL — ABNORMAL HIGH (ref 65–99)
Osmolality: 288 (ref 275–301)
Potassium: 4.8 mmol/L (ref 3.5–5.1)
SODIUM: 142 mmol/L (ref 136–145)

## 2013-12-02 LAB — MAGNESIUM: Magnesium: 2 mg/dL

## 2013-12-02 LAB — TSH: Thyroid Stimulating Horm: 1.74 u[IU]/mL

## 2013-12-04 LAB — BASIC METABOLIC PANEL
ANION GAP: 3 — AB (ref 7–16)
BUN: 13 mg/dL (ref 7–18)
CO2: 26 mmol/L (ref 21–32)
CREATININE: 0.95 mg/dL (ref 0.60–1.30)
Calcium, Total: 8.7 mg/dL (ref 8.5–10.1)
Chloride: 108 mmol/L — ABNORMAL HIGH (ref 98–107)
EGFR (African American): >60
GFR CALC NON AF AMER: 60 — AB
Glucose: 81 mg/dL (ref 65–99)
Osmolality: 273 (ref 275–301)
POTASSIUM: 4.6 mmol/L (ref 3.5–5.1)
Sodium: 137 mmol/L (ref 136–145)

## 2013-12-05 LAB — CBC WITH DIFFERENTIAL/PLATELET
Basophil #: 0.1 10*3/uL (ref 0.0–0.1)
Basophil %: 0.7 %
EOS PCT: 1.5 %
Eosinophil #: 0.1 10*3/uL (ref 0.0–0.7)
HCT: 28.5 % — ABNORMAL LOW (ref 35.0–47.0)
HGB: 9.6 g/dL — ABNORMAL LOW (ref 12.0–16.0)
LYMPHS ABS: 2.3 10*3/uL (ref 1.0–3.6)
Lymphocyte %: 24.9 %
MCH: 31.3 pg (ref 26.0–34.0)
MCHC: 33.6 g/dL (ref 32.0–36.0)
MCV: 93 fL (ref 80–100)
MONO ABS: 0.5 x10 3/mm (ref 0.2–0.9)
Monocyte %: 5.7 %
NEUTROS ABS: 6.1 10*3/uL (ref 1.4–6.5)
Neutrophil %: 67.2 %
PLATELETS: 177 10*3/uL (ref 150–440)
RBC: 3.06 10*6/uL — ABNORMAL LOW (ref 3.80–5.20)
RDW: 13.9 % (ref 11.5–14.5)
WBC: 9.4 10*3/uL (ref 3.6–11.0)

## 2013-12-06 LAB — CULTURE, BLOOD (SINGLE)

## 2014-04-01 ENCOUNTER — Ambulatory Visit: Payer: Self-pay | Admitting: Internal Medicine

## 2014-04-09 ENCOUNTER — Inpatient Hospital Stay: Payer: Self-pay | Admitting: Internal Medicine

## 2014-04-09 LAB — COMPREHENSIVE METABOLIC PANEL
Albumin: 2.9 g/dL — ABNORMAL LOW (ref 3.4–5.0)
Alkaline Phosphatase: 56 U/L
Anion Gap: 4 — ABNORMAL LOW (ref 7–16)
BUN: 42 mg/dL — AB (ref 7–18)
Bilirubin,Total: 0.7 mg/dL (ref 0.2–1.0)
CREATININE: 2.38 mg/dL — AB (ref 0.60–1.30)
Calcium, Total: 7.8 mg/dL — ABNORMAL LOW (ref 8.5–10.1)
Chloride: 112 mmol/L — ABNORMAL HIGH (ref 98–107)
Co2: 25 mmol/L (ref 21–32)
EGFR (African American): 23 — ABNORMAL LOW
EGFR (Non-African Amer.): 20 — ABNORMAL LOW
Glucose: 100 mg/dL — ABNORMAL HIGH (ref 65–99)
Osmolality: 292 (ref 275–301)
POTASSIUM: 4.8 mmol/L (ref 3.5–5.1)
SGOT(AST): 32 U/L (ref 15–37)
SGPT (ALT): 13 U/L (ref 12–78)
SODIUM: 141 mmol/L (ref 136–145)
Total Protein: 5.6 g/dL — ABNORMAL LOW (ref 6.4–8.2)

## 2014-04-09 LAB — CBC
HCT: 28.8 % — AB (ref 35.0–47.0)
HGB: 9.2 g/dL — ABNORMAL LOW (ref 12.0–16.0)
MCH: 30.3 pg (ref 26.0–34.0)
MCHC: 32 g/dL (ref 32.0–36.0)
MCV: 95 fL (ref 80–100)
Platelet: 128 10*3/uL — ABNORMAL LOW (ref 150–440)
RBC: 3.04 10*6/uL — ABNORMAL LOW (ref 3.80–5.20)
RDW: 14 % (ref 11.5–14.5)
WBC: 8.3 10*3/uL (ref 3.6–11.0)

## 2014-04-09 LAB — DIFFERENTIAL
Basophil #: 0.1 10*3/uL (ref 0.0–0.1)
Basophil %: 1.2 %
Eosinophil #: 0 10*3/uL (ref 0.0–0.7)
Eosinophil %: 0.4 %
Lymphocyte #: 1.2 10*3/uL (ref 1.0–3.6)
Lymphocyte %: 14.8 %
MONO ABS: 0.2 x10 3/mm (ref 0.2–0.9)
MONOS PCT: 2.7 %
NEUTROS PCT: 80.9 %
Neutrophil #: 6.7 10*3/uL — ABNORMAL HIGH (ref 1.4–6.5)

## 2014-04-09 LAB — TROPONIN I: Troponin-I: <0.02 ng/mL

## 2014-04-09 LAB — CK TOTAL AND CKMB (NOT AT ARMC)
CK, Total: 129 U/L
CK-MB: 2.9 ng/mL (ref 0.5–3.6)

## 2014-04-09 LAB — PRO B NATRIURETIC PEPTIDE: B-Type Natriuretic Peptide: 974 pg/mL — ABNORMAL HIGH (ref 0–125)

## 2014-04-09 IMAGING — CR DG CHEST 2V
1 series · 2 of 2 positions shown · non-contrast
Comparison: none

REASON FOR EXAM: infiltrate
COMMENTS:

PROCEDURE:     DXR - DXR CHEST PA (OR AP) AND LATERAL  - May 02, 2013  [DATE]
RESULT:     Comparison: 02/23/2013

[Series 1: pa · 0.17mm/px · 2 of 2 slices shown]
[im 1/2]
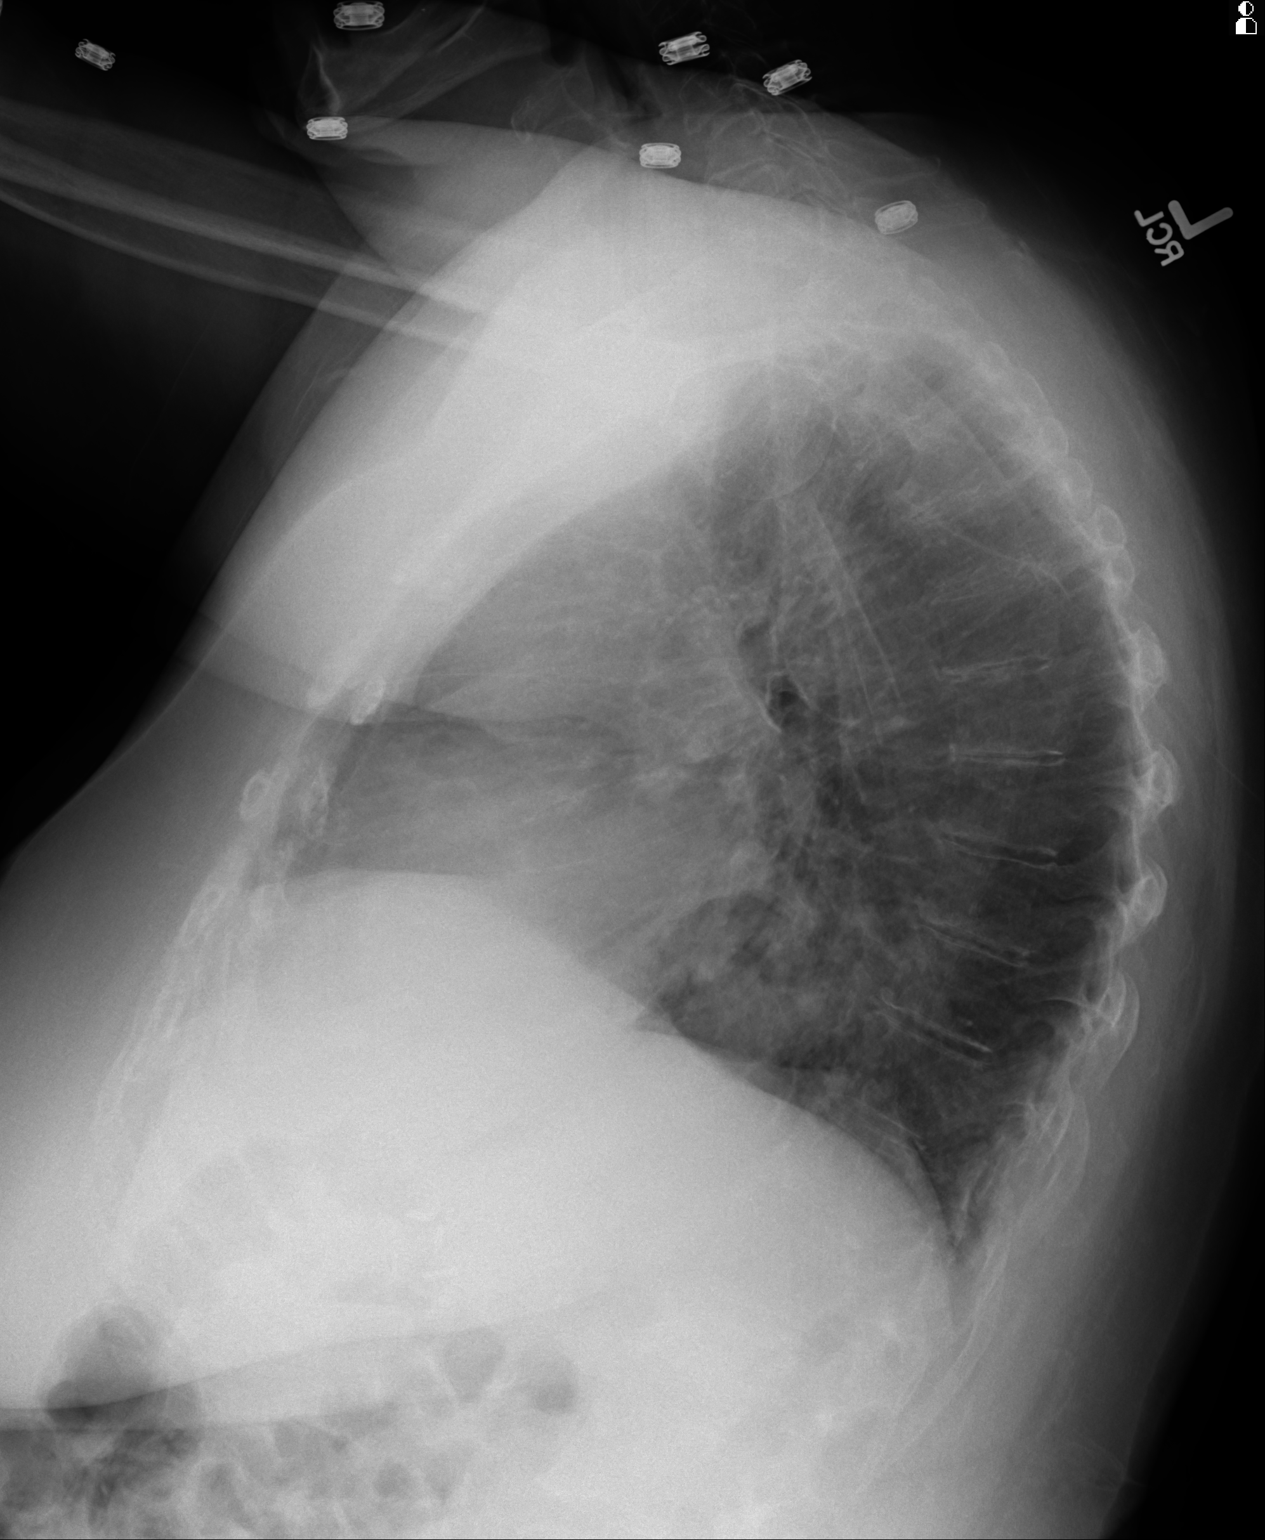
[im 2/2]
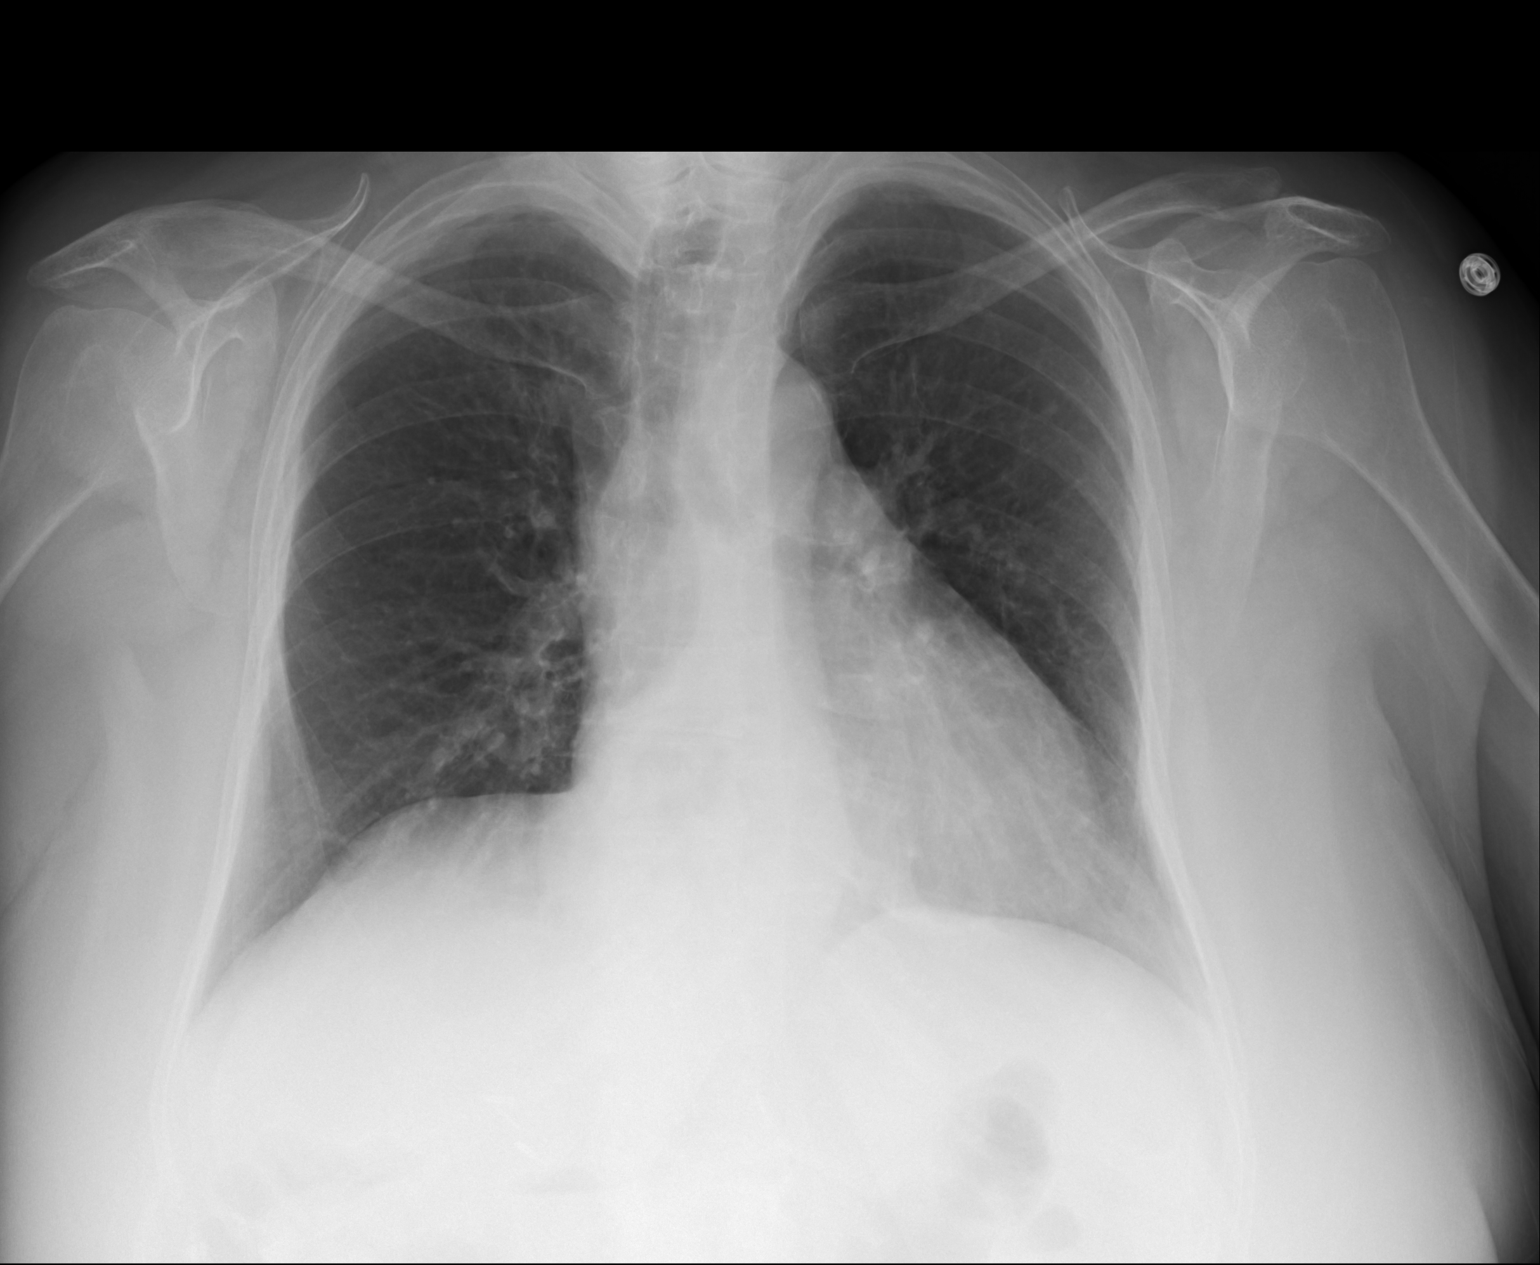

[2 of 2 positions shown; findings below may reference images not displayed]

FINDINGS: PA and lateral chest radiographs are provided.  There is no focal
parenchymal opacity, pleural effusion, or pneumothorax. The heart and
mediastinum are unremarkable.  The osseous structures are unremarkable.
IMPRESSION: No acute disease of the che[REDACTED]

## 2014-04-10 LAB — BASIC METABOLIC PANEL
ANION GAP: 8 (ref 7–16)
BUN: 32 mg/dL — ABNORMAL HIGH (ref 7–18)
Calcium, Total: 8.2 mg/dL — ABNORMAL LOW (ref 8.5–10.1)
Chloride: 113 mmol/L — ABNORMAL HIGH (ref 98–107)
Co2: 20 mmol/L — ABNORMAL LOW (ref 21–32)
Creatinine: 1.74 mg/dL — ABNORMAL HIGH (ref 0.60–1.30)
EGFR (African American): 33 — ABNORMAL LOW
GFR CALC NON AF AMER: 29 — AB
Glucose: 173 mg/dL — ABNORMAL HIGH (ref 65–99)
OSMOLALITY: 292 (ref 275–301)
POTASSIUM: 4.3 mmol/L (ref 3.5–5.1)
SODIUM: 141 mmol/L (ref 136–145)

## 2014-04-10 LAB — CBC WITH DIFFERENTIAL/PLATELET
Basophil #: 0 10*3/uL (ref 0.0–0.1)
Basophil %: 0.1 %
EOS PCT: 0.5 %
Eosinophil #: 0.1 10*3/uL (ref 0.0–0.7)
HCT: 28.6 % — AB (ref 35.0–47.0)
HGB: 9.3 g/dL — AB (ref 12.0–16.0)
LYMPHS ABS: 0.9 10*3/uL — AB (ref 1.0–3.6)
Lymphocyte %: 6.5 %
MCH: 30.9 pg (ref 26.0–34.0)
MCHC: 32.6 g/dL (ref 32.0–36.0)
MCV: 95 fL (ref 80–100)
MONO ABS: 0.1 x10 3/mm — AB (ref 0.2–0.9)
MONOS PCT: 1 %
NEUTROS ABS: 13.3 10*3/uL — AB (ref 1.4–6.5)
Neutrophil %: 91.9 %
Platelet: 132 10*3/uL — ABNORMAL LOW (ref 150–440)
RBC: 3.02 10*6/uL — AB (ref 3.80–5.20)
RDW: 14 % (ref 11.5–14.5)
WBC: 14.5 10*3/uL — ABNORMAL HIGH (ref 3.6–11.0)

## 2014-04-10 IMAGING — CR DG ABDOMEN 2V
1 series · 3 of 3 positions shown · non-contrast
Comparison: none

REASON FOR EXAM: abnormal CT of small bowel.
COMMENTS:   LMP: Post-Menopausal

PROCEDURE:     DXR - DXR ABDOMEN 2 V FLAT AND ERECT  - May 03, 2013 [DATE]
RESULT:     Comparison: Abdominal radiograph 01/01/2012 with, CT of the
abdomen and pelvis 04/30/2013

[Series 1: w abdomen upright · 0.14mm/px · 3 of 3 slices shown]
[im 1/3]
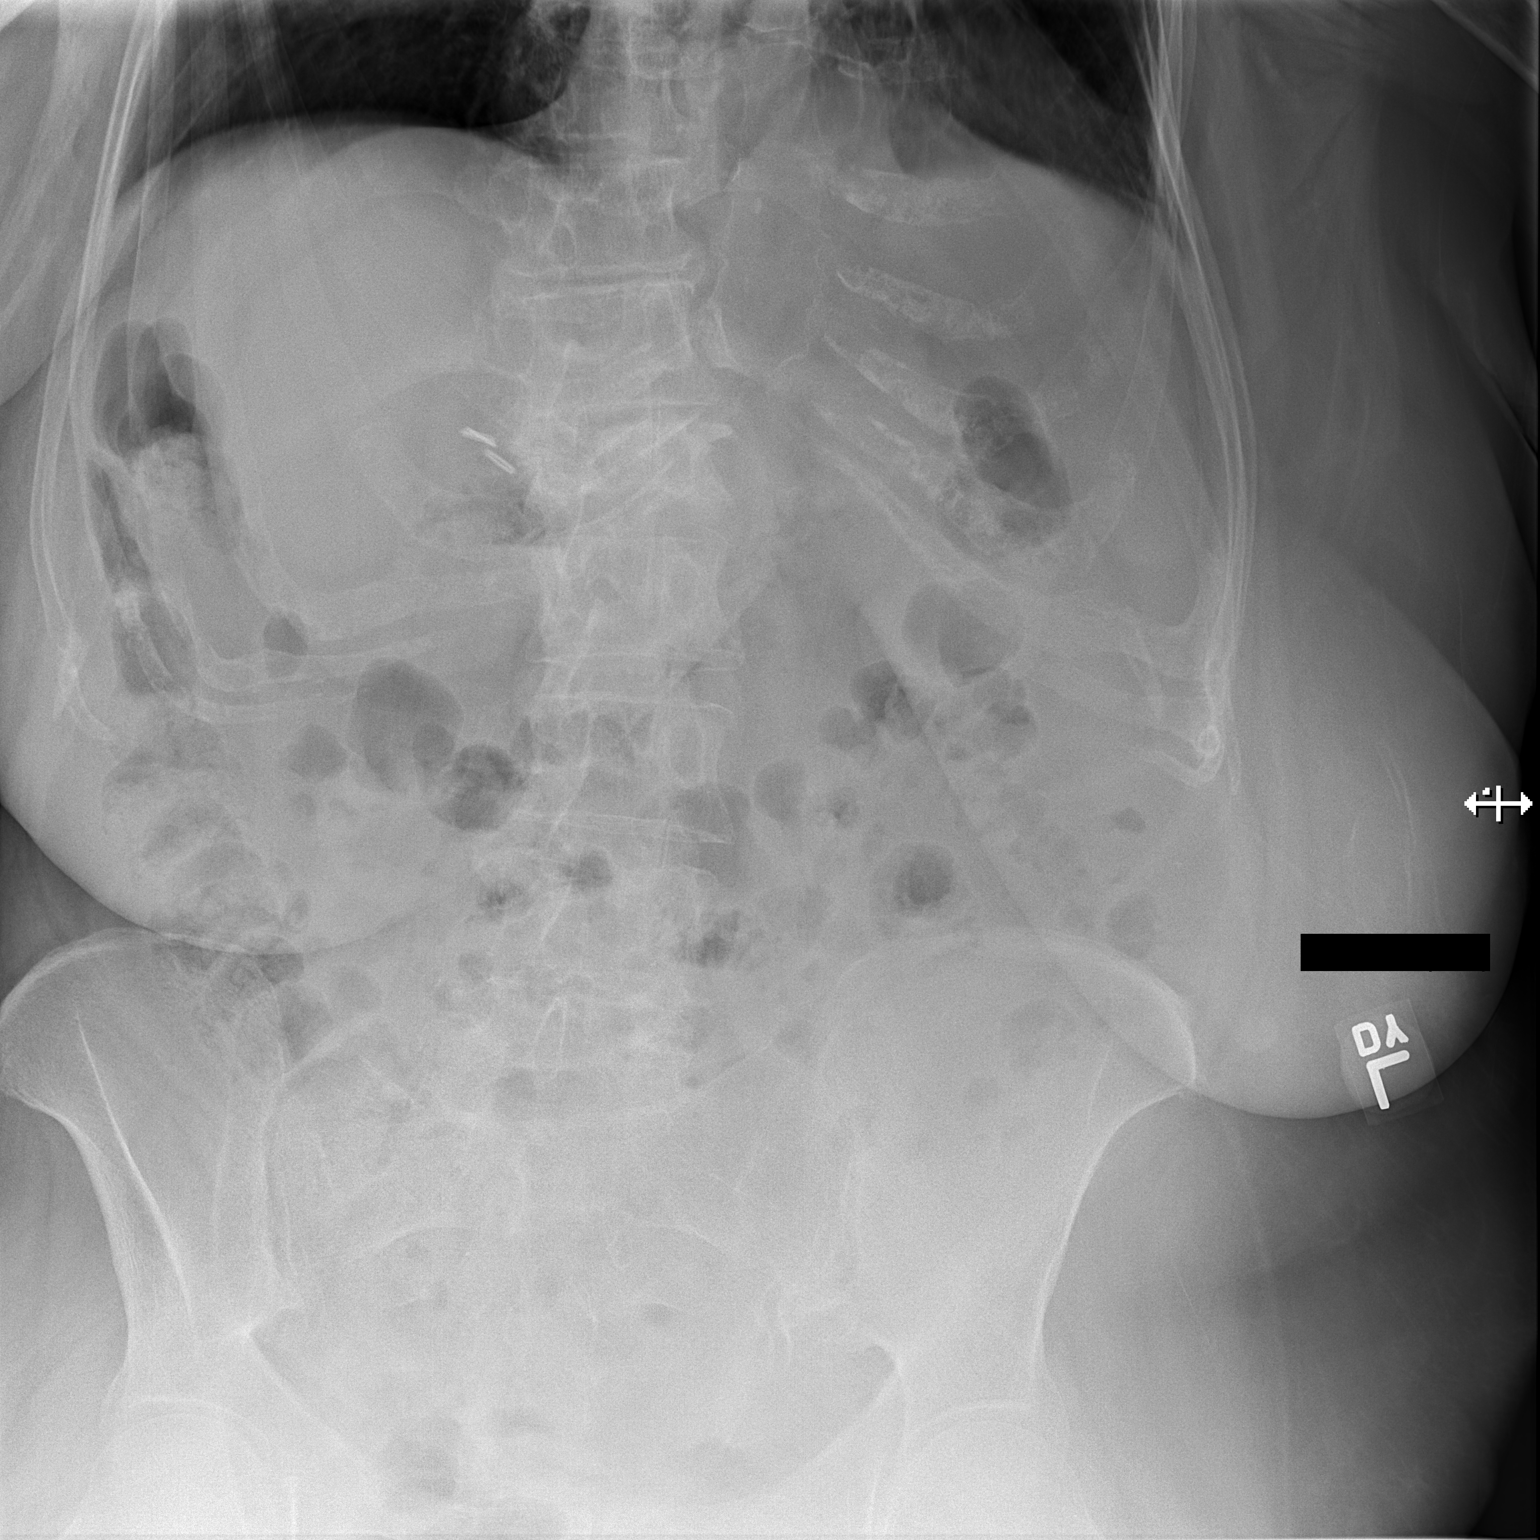
[im 2/3]
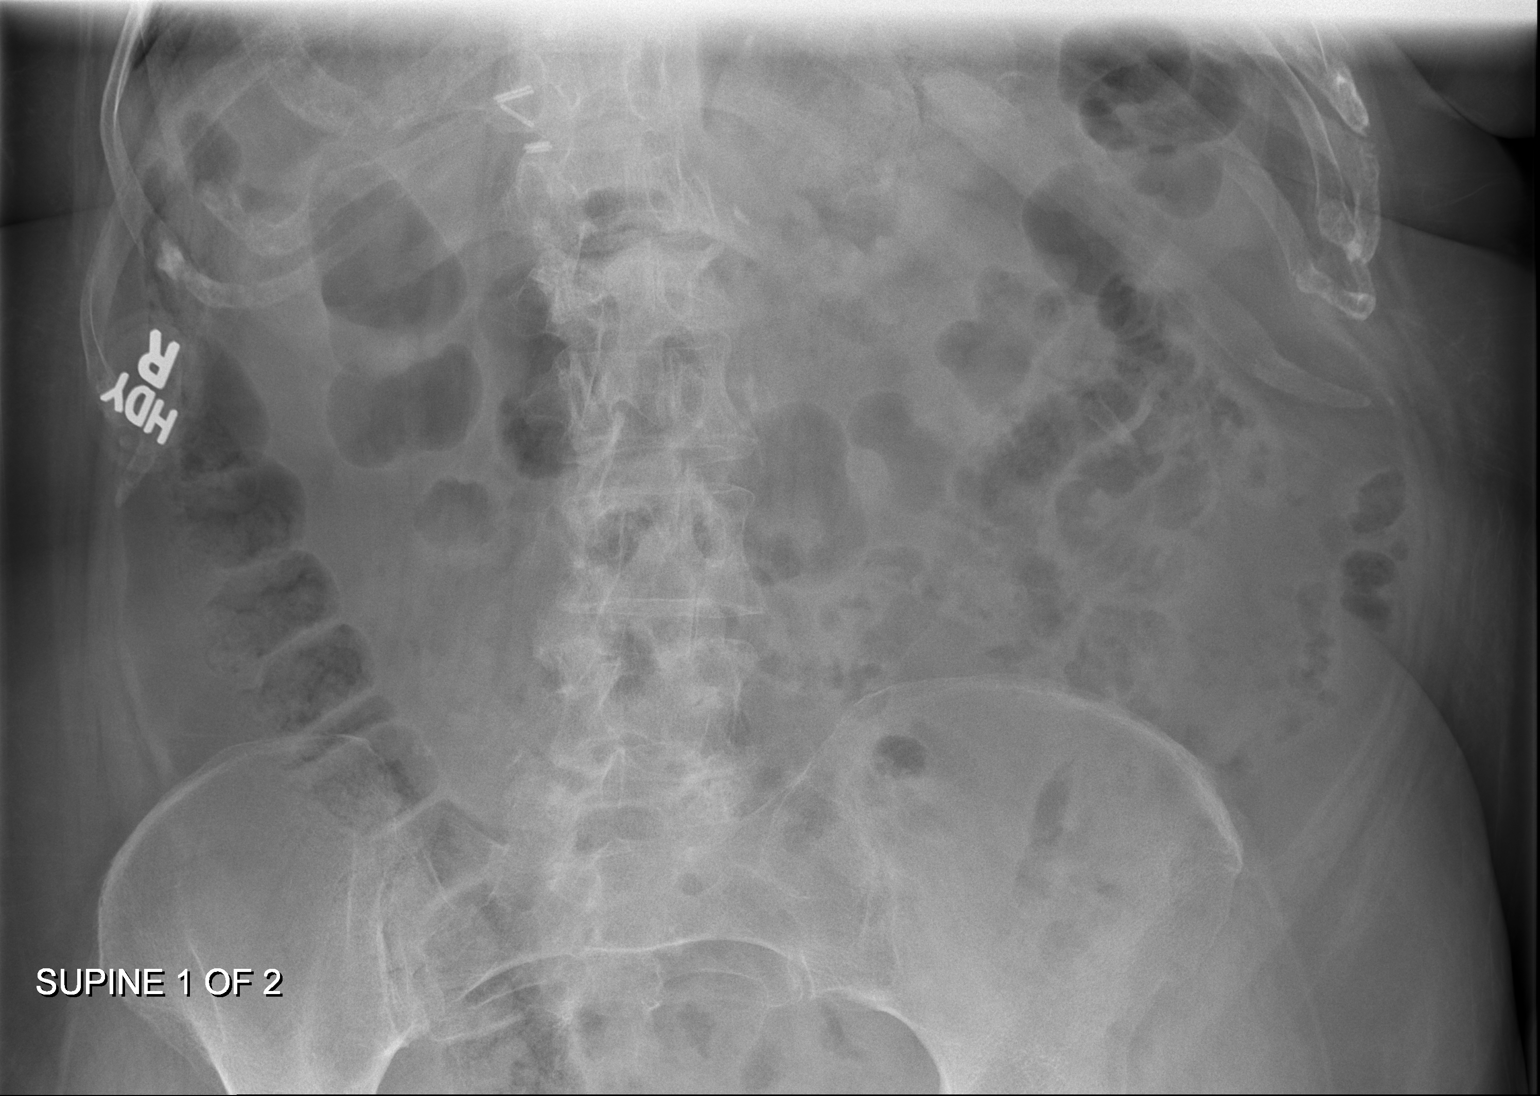
[im 3/3]
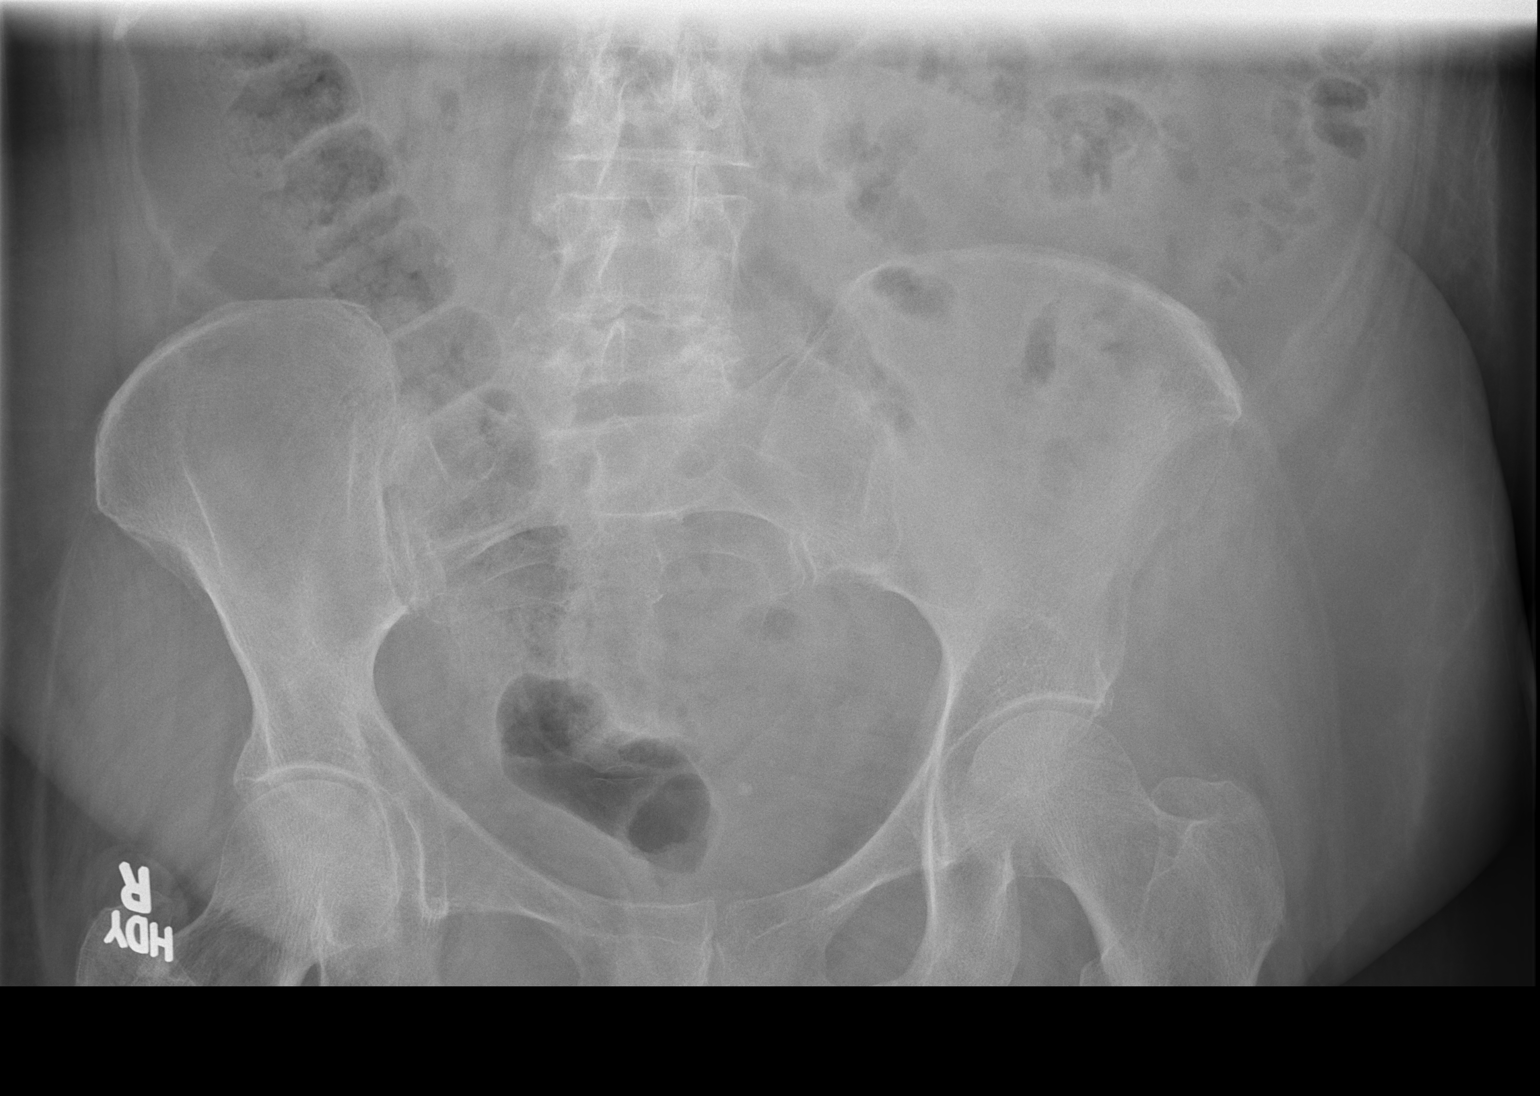

[3 of 3 positions shown; findings below may reference images not displayed]

FINDINGS: No free intraperitoneal air. Air is seen within nondilated small and large
bowel. Surgical clips seen from prior cholecystectomy. Calcification in the
pelvis likely represents a phlebolith.
IMPRESSION: Nonobstructed bowel gas pattern.

[REDACTED]

## 2014-04-11 LAB — CBC WITH DIFFERENTIAL/PLATELET
BASOS ABS: 0 10*3/uL (ref 0.0–0.1)
Basophil %: 0.1 %
Eosinophil #: 0 10*3/uL (ref 0.0–0.7)
Eosinophil %: 0 %
HCT: 26.4 % — AB (ref 35.0–47.0)
HGB: 8.8 g/dL — AB (ref 12.0–16.0)
Lymphocyte #: 0.9 10*3/uL — ABNORMAL LOW (ref 1.0–3.6)
Lymphocyte %: 7.7 %
MCH: 30.8 pg (ref 26.0–34.0)
MCHC: 33.3 g/dL (ref 32.0–36.0)
MCV: 93 fL (ref 80–100)
Monocyte #: 0.3 x10 3/mm (ref 0.2–0.9)
Monocyte %: 2.7 %
NEUTROS ABS: 10.2 10*3/uL — AB (ref 1.4–6.5)
Neutrophil %: 89.5 %
Platelet: 142 10*3/uL — ABNORMAL LOW (ref 150–440)
RBC: 2.86 10*6/uL — ABNORMAL LOW (ref 3.80–5.20)
RDW: 14.3 % (ref 11.5–14.5)
WBC: 11.4 10*3/uL — ABNORMAL HIGH (ref 3.6–11.0)

## 2014-04-11 LAB — BASIC METABOLIC PANEL
Anion Gap: 8 (ref 7–16)
BUN: 32 mg/dL — AB (ref 7–18)
CHLORIDE: 110 mmol/L — AB (ref 98–107)
CO2: 22 mmol/L (ref 21–32)
Calcium, Total: 8.3 mg/dL — ABNORMAL LOW (ref 8.5–10.1)
Creatinine: 1.38 mg/dL — ABNORMAL HIGH (ref 0.60–1.30)
EGFR (Non-African Amer.): 38 — ABNORMAL LOW
GFR CALC AF AMER: 44 — AB
GLUCOSE: 157 mg/dL — AB (ref 65–99)
OSMOLALITY: 290 (ref 275–301)
Potassium: 3.9 mmol/L (ref 3.5–5.1)
Sodium: 140 mmol/L (ref 136–145)

## 2014-04-12 LAB — CBC WITH DIFFERENTIAL/PLATELET
Basophil #: 0 10*3/uL (ref 0.0–0.1)
Basophil %: 0 %
Eosinophil #: 0 10*3/uL (ref 0.0–0.7)
Eosinophil %: 0 %
HCT: 27.7 % — ABNORMAL LOW (ref 35.0–47.0)
HGB: 9 g/dL — ABNORMAL LOW (ref 12.0–16.0)
Lymphocyte #: 0.9 10*3/uL — ABNORMAL LOW (ref 1.0–3.6)
Lymphocyte %: 8.4 %
MCH: 30 pg (ref 26.0–34.0)
MCHC: 32.5 g/dL (ref 32.0–36.0)
MCV: 92 fL (ref 80–100)
Monocyte #: 0.4 x10 3/mm (ref 0.2–0.9)
Monocyte %: 3.3 %
Neutrophil #: 9.6 10*3/uL — ABNORMAL HIGH (ref 1.4–6.5)
Neutrophil %: 88.3 %
Platelet: 145 10*3/uL — ABNORMAL LOW (ref 150–440)
RBC: 2.99 10*6/uL — ABNORMAL LOW (ref 3.80–5.20)
RDW: 13.9 % (ref 11.5–14.5)
WBC: 10.9 10*3/uL (ref 3.6–11.0)

## 2014-04-12 LAB — BASIC METABOLIC PANEL
Anion Gap: 7 (ref 7–16)
BUN: 28 mg/dL — AB (ref 7–18)
CO2: 23 mmol/L (ref 21–32)
CREATININE: 1.33 mg/dL — AB (ref 0.60–1.30)
Calcium, Total: 8.4 mg/dL — ABNORMAL LOW (ref 8.5–10.1)
Chloride: 112 mmol/L — ABNORMAL HIGH (ref 98–107)
EGFR (African American): 46 — ABNORMAL LOW
GFR CALC NON AF AMER: 40 — AB
Glucose: 125 mg/dL — ABNORMAL HIGH (ref 65–99)
OSMOLALITY: 290 (ref 275–301)
Potassium: 3.9 mmol/L (ref 3.5–5.1)
SODIUM: 142 mmol/L (ref 136–145)

## 2014-04-13 LAB — CBC WITH DIFFERENTIAL/PLATELET
BASOS ABS: 0 10*3/uL (ref 0.0–0.1)
BASOS PCT: 0.1 %
EOS ABS: 0 10*3/uL (ref 0.0–0.7)
EOS PCT: 0.1 %
HCT: 28.1 % — AB (ref 35.0–47.0)
HGB: 9.5 g/dL — ABNORMAL LOW (ref 12.0–16.0)
LYMPHS PCT: 9.9 %
Lymphocyte #: 1.1 10*3/uL (ref 1.0–3.6)
MCH: 30.5 pg (ref 26.0–34.0)
MCHC: 33.6 g/dL (ref 32.0–36.0)
MCV: 91 fL (ref 80–100)
Monocyte #: 0.5 x10 3/mm (ref 0.2–0.9)
Monocyte %: 4.2 %
Neutrophil #: 9.4 10*3/uL — ABNORMAL HIGH (ref 1.4–6.5)
Neutrophil %: 85.7 %
Platelet: 148 10*3/uL — ABNORMAL LOW (ref 150–440)
RBC: 3.1 10*6/uL — ABNORMAL LOW (ref 3.80–5.20)
RDW: 13.3 % (ref 11.5–14.5)
WBC: 11 10*3/uL (ref 3.6–11.0)

## 2014-04-13 LAB — BASIC METABOLIC PANEL
Anion Gap: 7 (ref 7–16)
BUN: 32 mg/dL — AB (ref 7–18)
CO2: 27 mmol/L (ref 21–32)
Calcium, Total: 8.8 mg/dL (ref 8.5–10.1)
Chloride: 109 mmol/L — ABNORMAL HIGH (ref 98–107)
Creatinine: 1.38 mg/dL — ABNORMAL HIGH (ref 0.60–1.30)
EGFR (African American): 44 — ABNORMAL LOW
EGFR (Non-African Amer.): 38 — ABNORMAL LOW
GLUCOSE: 141 mg/dL — AB (ref 65–99)
OSMOLALITY: 294 (ref 275–301)
Potassium: 3.4 mmol/L — ABNORMAL LOW (ref 3.5–5.1)
SODIUM: 143 mmol/L (ref 136–145)

## 2014-04-14 LAB — BASIC METABOLIC PANEL
Anion Gap: 10 (ref 7–16)
BUN: 35 mg/dL — ABNORMAL HIGH (ref 7–18)
CHLORIDE: 105 mmol/L (ref 98–107)
Calcium, Total: 8.5 mg/dL (ref 8.5–10.1)
Co2: 27 mmol/L (ref 21–32)
Creatinine: 1.19 mg/dL (ref 0.60–1.30)
EGFR (African American): 53 — ABNORMAL LOW
EGFR (Non-African Amer.): 46 — ABNORMAL LOW
Glucose: 147 mg/dL — ABNORMAL HIGH (ref 65–99)
OSMOLALITY: 294 (ref 275–301)
Potassium: 3.4 mmol/L — ABNORMAL LOW (ref 3.5–5.1)
Sodium: 142 mmol/L (ref 136–145)

## 2014-04-14 LAB — CULTURE, BLOOD (SINGLE)

## 2014-04-15 LAB — BASIC METABOLIC PANEL
Anion Gap: 7 (ref 7–16)
BUN: 36 mg/dL — ABNORMAL HIGH (ref 7–18)
Calcium, Total: 8.1 mg/dL — ABNORMAL LOW (ref 8.5–10.1)
Chloride: 106 mmol/L (ref 98–107)
Co2: 30 mmol/L (ref 21–32)
Creatinine: 1.18 mg/dL (ref 0.60–1.30)
EGFR (African American): 53 — ABNORMAL LOW
EGFR (Non-African Amer.): 46 — ABNORMAL LOW
Glucose: 141 mg/dL — ABNORMAL HIGH (ref 65–99)
Osmolality: 296 (ref 275–301)
Potassium: 3.5 mmol/L (ref 3.5–5.1)
Sodium: 143 mmol/L (ref 136–145)

## 2014-04-15 LAB — CBC WITH DIFFERENTIAL/PLATELET
Basophil #: 0 10*3/uL (ref 0.0–0.1)
Basophil %: 0.2 %
Eosinophil #: 0 10*3/uL (ref 0.0–0.7)
Eosinophil %: 0 %
HCT: 27.8 % — ABNORMAL LOW (ref 35.0–47.0)
HGB: 9.4 g/dL — ABNORMAL LOW (ref 12.0–16.0)
Lymphocyte #: 0.8 10*3/uL — ABNORMAL LOW (ref 1.0–3.6)
Lymphocyte %: 8.5 %
MCH: 30.7 pg (ref 26.0–34.0)
MCHC: 33.9 g/dL (ref 32.0–36.0)
MCV: 91 fL (ref 80–100)
Monocyte #: 0.3 x10 3/mm (ref 0.2–0.9)
Monocyte %: 3.4 %
Neutrophil #: 8.8 10*3/uL — ABNORMAL HIGH (ref 1.4–6.5)
Neutrophil %: 87.9 %
Platelet: 142 10*3/uL — ABNORMAL LOW (ref 150–440)
RBC: 3.07 10*6/uL — ABNORMAL LOW (ref 3.80–5.20)
RDW: 13.4 % (ref 11.5–14.5)
WBC: 10.2 10*3/uL (ref 3.6–11.0)

## 2014-04-15 LAB — CULTURE, BLOOD (SINGLE)

## 2014-04-15 LAB — CLOSTRIDIUM DIFFICILE(ARMC)

## 2014-04-15 LAB — MAGNESIUM: Magnesium: 1.7 mg/dL — ABNORMAL LOW

## 2014-04-16 LAB — BASIC METABOLIC PANEL
ANION GAP: 6 — AB (ref 7–16)
ANION GAP: 9 (ref 7–16)
BUN: 35 mg/dL — AB (ref 7–18)
BUN: 36 mg/dL — AB (ref 7–18)
CHLORIDE: 105 mmol/L (ref 98–107)
CO2: 29 mmol/L (ref 21–32)
Calcium, Total: 8.1 mg/dL — ABNORMAL LOW (ref 8.5–10.1)
Calcium, Total: 7.9 mg/dL — ABNORMAL LOW (ref 8.5–10.1)
Chloride: 106 mmol/L (ref 98–107)
Co2: 25 mmol/L (ref 21–32)
Creatinine: 1.12 mg/dL (ref 0.60–1.30)
Creatinine: 1.25 mg/dL (ref 0.60–1.30)
EGFR (African American): 57 — ABNORMAL LOW
EGFR (Non-African Amer.): 49 — ABNORMAL LOW
GFR CALC AF AMER: 50 — AB
GFR CALC NON AF AMER: 43 — AB
GLUCOSE: 151 mg/dL — AB (ref 65–99)
Glucose: 131 mg/dL — ABNORMAL HIGH (ref 65–99)
OSMOLALITY: 291 (ref 275–301)
Osmolality: 289 (ref 275–301)
POTASSIUM: 3.5 mmol/L (ref 3.5–5.1)
POTASSIUM: 3.7 mmol/L (ref 3.5–5.1)
SODIUM: 140 mmol/L (ref 136–145)
Sodium: 140 mmol/L (ref 136–145)

## 2014-04-18 ENCOUNTER — Encounter: Payer: Self-pay | Admitting: Internal Medicine

## 2014-04-29 ENCOUNTER — Encounter: Payer: Self-pay | Admitting: Internal Medicine

## 2014-05-05 LAB — CLOSTRIDIUM DIFFICILE(ARMC)

## 2015-02-19 NOTE — Consult Note (Signed)
Pt complains of some epigastric abd pain and thinks a tramadol might help.  CC abd pain and partial SBO.  X-ray from today does not show any evidence of SBO.  I think she can go home if she has support at home or family who will look after her.  Pt said she has not had a bowel movement since admission so will order Dulcolax.  Electronic Signatures: Scot JunElliott, Destenie Ingber T (MD)  (Signed on 05-Jul-14 11:41)  Authored  Last Updated: 05-Jul-14 11:41 by Scot JunElliott, Keeshia Sanderlin T (MD)

## 2015-02-19 NOTE — Consult Note (Signed)
PATIENT NAME:  Misty Medina, Misty Medina#:  [id number] DATE OF BIRTH:  [birth year]  DATE OF CONSULTATION:  05/28/2013  REFERRING PHYSICIAN:  Dr. Ellsworth Lennoxejan-Sie.  CONSULTING PHYSICIAN:  Misty FujitaZachary E. Malvin JohnsPotter, MD  CHIEF COMPLAINT:  Confusion and aphasia.   HISTORY OF PRESENT ILLNESS: A 74 year old woman with a history of hypertension and anemia who lives in independent living, presents because of abrupt onset confusion and aphasia. During my encounter with the patient, the patient is able to follow some simple commands but otherwise continues mumbling the name Misty Medina which apparently is the name of her cat.  There is no family in the room to provide additional history. It seems that this patient's daughter this morning noted that the patient was also having some aphasia as well as the confusion. This patient has had a brain MRI that was found to be normal. As mentioned above, this patient is typically living in independent living at her facility and is normally even able to drive. As such, this is a marked change from her baseline level of function. The patient denies being in any pain or distress. The symptoms are constant and do not come and go. They are moderate in severity.  Nothing seems to make them better or worse. They are very persistent. There are no other focal neurologic deficits identified.   PAST MEDICAL HISTORY: GERD, hypertension, hyperlipidemia, depression, anemia, colitis, IBS, hiatal hernia, insomnia.   PAST SURGICAL HISTORY: Total abdominal hysterectomy, cholecystectomy.   FAMILY HISTORY: Mother: Heart failure. Father: Heart failure.   SOCIAL HISTORY: As above, this patient lives in independent living at her facility. The patient does not use drugs, alcohol or tobacco.   REVIEW OF SYSTEMS: Unable to obtain due to altered mental status.   MEDICATIONS: Hydrocodone, alprazolam, atropine, Coreg, escitalopram, Dexilant, levocetirizine, levothyroxine, Lyrica, methocarbamol, Zocor, Tandem, tizanidine,  tramadol, Zolpidem.    ALLERGIES: CODEINE, PENICILLINS.    PHYSICAL EXAMINATION:  VITALS: Temperature 97 degrees Fahrenheit, blood pressure 177/74, pulse 90.  GENERAL: This patient is in no acute distress. The patient is sitting upright. She is in no pain. She is interactive.  HEAD: Normocephalic and atraumatic.   EYES: Funduscopic exam is within normal limits. There is no evidence of papilledema.  CARDIOVASCULAR: S1 and S2 heart sounds are within normal limits.  There are no murmurs, gallops or rubs.  MUSCULOSKELETAL: Muscle bulk and tone are within normal limits.  NEUROLOGIC: I am unable to test for pronator drift because she does not follow these commands due to altered mental status. AMBULATION: I am unable to test her ambulation at present because she does not follow this command. Strength testing is at least 4+ out of 5 in the bilateral upper and lower extremities. The patient does not follow individual muscle testing commands but is able to independently move each extremity without any clear focal difficulties. MENTAL STATUS: The patient does not answer mental status questions. She does not follow mental status commands due to altered mental status. The patient occasionally follows some very simple commands. She shows me her thumb one time when asked. The patient continues repeating the name Misty Medina. I asked her if Misty Medina is a person or an animal and she says animal which is correct. She does not identify her name, the year or where she is located. CRANIAL NERVES: Visual acuity and visual fields are grossly intact. Extraocular muscles are grossly intact. Facial sensation and strength are grossly intact. Hearing appears intact. She does not follow commands to test her palate elevation or tongue  protrusion. She does not follow commands for testing shoulder shrug strength due to altered mental status. SENSATION: Grossly intact to pain, temperature, position and vibration throughout. REFLEXES: In the  bilateral biceps, brachioradialis, patellar and Achilles 2+. COORDINATION AND CEREBELLAR: Finger-to-nose testing is unable to be performed because the patient does not follow this command.   IMAGING: Head CT is personally reviewed and is unremarkable. Brain MRI unremarkable.    TESTS: EKG shows sinus rhythm.   LABS: Glucose 92. Electrolytes are within normal limits. CBC is within normal limits. Creatinine is 1.2. Urinalysis is negative for UTI.   ASSESSMENT AND RECOMMENDATIONS: A 74 year old woman who was previously healthy, found to have abrupt onset and persistent confusion with aphasia. This patient's neurological examination is limited but is grossly intact. There is no clear focal abnormality identified. I have reviewed her imaging tests and labs, and they are also within normal limits. I would recommend checking a serum ammonia level. I would recommend checking a routine EEG to evaluate for possible complex partial status epilepticus. I would recommend trying to limit any medications that could potentially interfere with her cognition. The patient does not have fevers to suggest encephalitis. She does not have neck tenderness to suggest meningitis. If she continues to be altered without any findings on the workup as mentioned above, then I would consider a spinal tap.   ____________________________ Misty Fujita. Malvin Johns, MD zep:gb D: 05/29/2013 01:49:00 ET T: 05/29/2013 02:37:51 ET JOB#: [id number]  cc: Misty Fujita. Malvin Johns, MD, <Dictator> Morene Crocker MD ELECTRONICALLY SIGNED 06/10/2013 12:16

## 2015-02-19 NOTE — Consult Note (Signed)
PATIENT NAME:  Misty Medina, [name] MR#:  [id number] DATE OF BIRTH:  20-Aug-1941  DATE OF CONSULTATION:  05/01/2013  REFERRING PHYSICIAN:  Marlaine HindAmir Darwish, MD CONSULTING PHYSICIAN:  Lutricia FeilPaul Oh, MD / Hardie ShackletonKaryn M. Dessirae Scarola, PA-C  REASON FOR CONSULTATION: Coffee-ground emesis.   HISTORY OF PRESENT ILLNESS: This is a pleasant 74 year old female who presents with a 2 day history of nausea and vomiting with her emesis appearing like coffee grounds. There is also some epigastric discomfort and dyspepsia-like symptoms. She denies any bright red blood per rectum or melena. No hematochezia. No obvious red blood in the vomit. No history of fevers and she denies recent NSAID use. In the Emergency Department a CT scan of the abdomen was obtained showing a questionable partial bowel obstruction in the proximal jejunum and also a large sliding hiatal hernia. She is admitted for further evaluation and work-up. Per medical record, it does show a history of collagenous colitis, but the patient is a poor historian and is unable to give me details on this. Her medication list does not show that she takes any medication for this type of colitis. She denies any history of peptic ulcer disease. No unintentional weight changes. No dysphagia. No shortness of breath.   ALLERGIES: CODEINE AND PENICILLIN.   PAST MEDICAL HISTORY: Collagenous colitis, hypertension, dyslipidemia, insomnia, depression, GERD and hiatal hernia.   PAST SURGICAL HISTORY: Total hysterectomy and cholecystectomy.   SOCIAL HISTORY: The patient denies any alcohol, tobacco or illicit drug use.   FAMILY HISTORY: The patient has no known family history of GI malignancy, colon polyps or IBD.   REVIEW OF SYSTEMS:  A 10 system review of systems was obtained on the patient. Pertinent positives are mentioned above and otherwise negative.   OBJECTIVE: VITAL SIGNS: Blood pressure and heart rate are stable.  GENERAL: This is a pleasant 74 year old female resting quietly and  comfortably in the exam room, in no acute distress, alert and oriented x 3.  HEAD: Atraumatic, normocephalic.  NECK: Supple. No lymphadenopathy noted.  HEENT: Sclerae anicteric. Mucous membranes moist.  LUNGS: Respirations are even and unlabored, clear to auscultation in bilateral anterior lung fields.  CARDIAC: Regular rate and rhythm. S1 and S2 noted.  ABDOMEN: Soft, nontender and nondistended. Normoactive bowel sounds noted in all 4 quadrants. Exam is limited secondary to obese habitus. The patient is sitting on the side of her bed feeling significantly nauseated currently.  EXTREMITIES: Negative for lower extremity edema.  PSYCHIATRIC: Appropriate mood and affect.   LABORATORY DATA: White blood cells 11.8, hemoglobin 10.4, hematocrit 34.6, platelets 226, sodium 148, potassium 4.7, BUN 37, creatinine 1.21, glucose 87, lipase 212. Cardiac enzymes are negative. Urinalysis negative.   IMAGING: A CT scan of the abdomen and pelvis was obtained on the patient showing a large sliding hiatal hernia. Also a questionable proximal jejunal bowel obstruction.   ASSESSMENT: 1.  Nausea and vomiting with coffee-ground emesis.  2.  Mild normocytic anemia.  3.  Large sliding hiatal hernia noted on CT scan with questionable proximal jejunal partial bowel obstruction. Of note, the patient does continue to have regular bowel movements and is passing gas.   PLAN: I have discussed this patient's case in detail with Dr. Lutricia FeilPaul Oh who is involved in the development of the patient's plan of care. At this time, I do recommend proceeding with a diagnostic EGD for the indication of coffee-ground emesis. Differential diagnosis was discussed in detail with the patient. Alternatives, risks and benefits of the EGD were discussed including risk  of bleeding, perforation, infection and anesthesia, and she verbalized understanding and is in agreement to proceed. We do recommend closely monitoring her hemoglobin and hematocrit due to  a questionable upper gastrointestinal bleed as well as starting PPI therapy and antiemetics as needed. We recommend that she maintain n.p.o. until the EGD can be completed. All questions were answered. We will continue to follow this patient and make further recommendations per clinical course.   Thank you so much for this consultation and for allowing [place] to participate in the patient's plan of care.   This services provided by Hardie Shackleton. Sharanya Templin, PA-C under collaborative agreement with Lutricia Feil, MD.   ____________________________ Hardie Shackleton. Wendelin Bradt, PA-C kme:sb D: 05/01/2013 14:15:14 ET T: 05/01/2013 14:33:28 ET JOB#: [id number]  cc: Hardie Shackleton. Jnai Snellgrove, PA-C, <Dictator> Hardie Shackleton Cheston Coury PA ELECTRONICALLY SIGNED 05/05/2013 16:22

## 2015-02-19 NOTE — H&P (Signed)
PATIENT NAME:  Misty Medina, Misty Medina MR#:  [id number] DATE OF BIRTH:  [birth year]  DATE OF ADMISSION:  05/27/2013  REFERRING PHYSICIAN: Glennie Isle, MD  PRIMARY CARE PHYSICIAN: Bluford Main, MD   CHIEF COMPLAINT: Confusion and aphasia today.   HISTORY OF PRESENT ILLNESS: The patient is a 74 year old Caucasian female with a history of hypertension, hyperlipidemia, chronic anemia was sent from home due to confusion and aphasia today. The patient is confused, only saying Huntley Dec, her cat name.  The patient could not provide any information. Her daughter just left to home. According to Dr. Mindi Junker, the patient was sent by her daughter due to confusion and aphasia today.  The patient continues saying Huntley Dec.  She was noted to have aphasia, and she is unable to move extremities. She was suspected to have a CVA though CT of head is negative. The patient is treated with aspirin in the ED.  PAST MEDICAL HISTORY: Hiatal hernia, GERD, colitis, IBS, hypertension, hyperlipidemia, chronic anemia, insomnia and depression.   PAST SURGICAL HISTORY: Cholecystectomy, total abdominal hysterectomy.   FAMILY HISTORY: According to previous documents, father died of at the age of 82 from heart attack. Mother died at age 71 from complications of heart failure.   SOCIAL HISTORY: No smoking or drinking or illicit drugs. Lives at home alone, care by her daughter who visits her from time to time.   REVIEW OF SYSTEMS: Unable to obtain at this time.   ALLERGIES: CODEINE, PENICILLIN.   HOME MEDICATIONS:  1.  Acetaminophen/hydrocodone 325 mg/5 mg p.o. t.i.d. p.r.n.  2.  Alprazolam 0.25 mg p.o. b.i.d.  3.  Atropine/diphenoxylate 0.025 mg/2.5 mg p.o. tablets 1 tablet 1 to 2 times a day p.r.n. for diarrhea. 4.  Coreg 25 mg p.o. b.i.d.  5.  Escitalopram 25 mg p.o. once a day. 6.  Dexilant 60 mg p.o. once a day 30 minutes before breakfast.  7.  Levocetirizine 5 mg p.o. once a day.  8.  Levothyroxine 50 mcg p.o. daily.  9.   Lyrica 150 mg p.o. b.i.d.  10.  Methocarbamol 750 mg 0.5 to 1 tablet t.i.d. p.r.n. for muscle spasms.  11.  Zocor 40 mg p.o. in the evening.  12.  Tandem 160 mg/115.2 mg cap 1 cap once a day.  13.  Tizanidine 2 mg p.o. 1 tablet t.i.d.  14.  Tramadol 50 mg p.o. q. 6 hours p.r.n.  15.  Zolpidem 10 mg p.o. at bedtime.   PHYSICAL EXAMINATION: VITAL SIGNS: Temperature 97, blood pressure 177/74, pulse 90, respirations 18, O2 saturation 95% on room air.  GENERAL: The patient is awake, confused, in no acute distress.  HEENT: Pupils are round, equal and reactive to light. Moist oral mucosa. Clear oropharynx.  NECK: Supple. No JVD or carotid bruit.  No lymphadenopathy,  no thyromegaly.  CARDIOVASCULAR: S1, S2 regular rate and rhythm. No murmurs, gallops.  PULMONARY: Bilateral air entry. No wheezing or rales. No use of accessory muscles to breathe.  ABDOMEN: Soft. No distention or tenderness. No organomegaly. Bowel sounds are present.  EXTREMITIES: No edema, clubbing or cyanosis. No calf tenderness.  SKIN: No rash or jaundice.  NEUROLOGIC: The patient is confused, unable to examine at this time.   LABORATORY AND RADIOLOGICAL DATA:  CAT scan of head shows no acute abnormality.  CBC is normal. Glucose 92, BUN 15, creatinine 1.21. The electrolytes are normal. Lipase 52. Urinalysis negative. EKG showed sinus tachycardia with fusion complexes at 107 beats per minute.   IMPRESSION: 1.  Altered mental status,  need to rule out CVA.  2.  Encephalopathy, possibly due to medication or CVA.  3.  Hypertension.  4.  Hyperlipidemia.   PLAN OF TREATMENT: 1.  The patient will be admitted to a tele floor. We will get an MRI of brain, carotid duplex and echocardiograph. We will continue aspirin 325 mg p.o. daily. Continue Zocor.  2.  We will keep n.p.o., IV fluid support. We will get a PT and a swallowing study.  3.  For altered mental status, we will hold tramadol, Lyrica, methocarbamol, tizanidine and Zolpidem.   4.  For hypertension, we will continue Coreg.  5.  GI and DVT prophylaxis   I discussed with the patient's daughter about the patient's condition and plan of treatment.   CODE STATUS:  DNR.     TIME SPENT: About 55 minutes.    ____________________________ Amirr Achord, MD qc:cb D: 05/27/2013 Shaune Pollack15:25:33 ET T: 05/27/2013 16:13:05 ET JOB#: [id number]  cc: Shaune PollackQing Reon Hunley, MD, <Dictator> Shaune PollackQING Kimmy Totten MD ELECTRONICALLY SIGNED 05/28/2013 13:00

## 2015-02-19 NOTE — Consult Note (Signed)
Pt CC is abd pain.  She has a hx of collagenous colitits that caused severe dehydration in the past and was successfully treated with Entocort.  She had a CT scah suggesting a partial SBO of proximal jejunum.  Today she denies vomiting or abd pain and says she can eat pudding.  Abd mild tender.  Will get repeat abd film in morning.  [facility] CT with contrast [facility]   Electronic Signatures: Scot JunElliott, Konnor Vondrasek T (MD)  (Signed on 04-Jul-14 15:02)  Authored  Last Updated: 04-Jul-14 15:02 by Scot JunElliott, Tobey Lippard T (MD)

## 2015-02-19 NOTE — Discharge Summary (Signed)
   PATIENT NAME:  Misty Medina, Misty Medina MR#:  [id number] DATE OF BIRTH:  1940/12/02  DATE OF ADMISSION:  05/27/2013 DATE OF DISCHARGE:  06/03/2013  ADDENDUM  DISCHARGE MEDICATIONS:  1.  Dexilant 60 mg daily. 2.  Levothyroxine 50 mcg once a day.  3.  Tandem 162 mg -112 mg once daily. 4.  Citalopram 20 mg daily.  5.  Carvedilol 25 mg b.i.d.  6.  Atropine diphenoxylate 1 tab one to 2 times a day as needed for diarrhea.  7.  Levocetirizine 5 mg daily.  8.  Simvastatin 40 mg daily.  9.  Vicodin 5/325, one tab t.i.d. p.r.n.  10.  Tramadol 50 mg p.r.n. as needed for pain.  11.  Alprazolam 0.25 mg b.i.d.  12.  Zolpidem 10 mg at bedtime.   HOSPITAL COURSE: This lady was admitted through the Emergency Room after presenting with marked confusion and perseveration characterized by continually repeating the name of her cat, [name]. She initially underwent CT scan of the brain in the ED. Initial electrolytes were normal. There was no obvious focus of infection and she was admitted for further evaluation and management. Please refer to history and physical for details.   On the following day, the patient is still perseverating. I obtained a neurology consultation with Dr.  Theora MasterZachary Potter and psychiatric consult with Dr. Jaclynn MajorGreason, who recommended adding haloperidol for suspected delirium.   Dr. Malvin JohnsPotter of of neurology also evaluated her and attributed her symptoms to encephalopathy and ordered electroencephalogram and serum ammonia which was normal.   On the following day, the patient became quite acutely confused,and delirious after receiving dose of haloperidol. This was discontinued. She underwent a repeat CT scan which was negative. Her EEG revealed slow brain wave tracing consistent with an encephalopathic process of undetermined etiology.   A lumbar puncture was ordered; however, this could not be performed as the patient was currently on aspirin and low molecular-weight heparin. The patient's mental  status, however, started improving on the third day of admission and continued to improve until she was fully oriented x3 and also extubated, increased p.o. intakes with ability to tolerate a mechanical diet unaided.   The patient is still somewhat debilitated, so physical therapy is required and she will be discharged   to a skilled nursing facility. The patient had electrolyte abnormalities, namely hypokalemia, that was corrected with supplementation.   The patient is discharged to skilled nursing facility in satisfactory condition.   DISCHARGE INSTRUCTIONS:   DIET:  Mechanical soft, low sodium, low cholesterol.   ACTIVITY: As tolerated. The patient to continue physical therapy.   FOLLOWUP: Follow up with Dr. Ellsworth Lennoxejan-Sie in 1-2 weeks following discharge from skilled  nursing facility.   DISCHARGE PROCESS TIME SPENT: 36 minutes.    ____________________________ Silas FloodSheikh A. Ellsworth Lennoxejan-Sie, MD sat:np D: 06/03/2013 13:26:00 ET T: 06/03/2013 15:06:06 ET JOB#: [id number]  cc: Sheikh A. Ellsworth Lennoxejan-Sie, MD, <Dictator> Charlesetta GaribaldiSHEIKH A TEJAN-SIE MD ELECTRONICALLY SIGNED 06/04/2013 13:08

## 2015-02-19 NOTE — Discharge Summary (Signed)
PATIENT NAME:  Misty Medina, Misty Medina MR#:  [id number] DATE OF BIRTH:  [birth year]  DATE OF ADMISSION:  05/27/2013 DATE OF DISCHARGE:  06/03/2013  ADMITTING PHYSICIAN: Dr. Shaune PollackQing Chen  PRIMARY CARE PHYSICIAN: Dr. Ellsworth Lennoxejan-Sie  DISCHARGE DIAGNOSES:  1.  Encephalopathy. 2.  Electrolyte abnormalities, resolved.   PROCEDURES: CT scan of the head x 2. Electroencephalogram.   CONSULTATIONS:  1.  Neurology, Dr. Theora MasterZachary Potter.  2.  Psychiatry, Dr. Jaclynn MajorGreason.   DISCHARGE MEDICATIONS:  1.  Lyrica 150 mg twice a day. 2.  Dexilant 60 mg once a day.  3.  Levothyroxine 50 mcg daily.  4.  Tandem 1 capsules daily.  5.  Citalopram 20 mg daily.  6.  Methocarbamol 750 mg one half to 1 tab t.i.d. 7.  Tizanidine 2 mg t.i.d.  8.  Carvedilol 25 mg b.i.d.   Dictation ends here.  Please see addendum for continuation of dictation.   ____________________________ Silas FloodSheikh A. Ellsworth Lennoxejan-Sie, MD sat:aw D: 06/03/2013 13:19:00 ET T: 06/03/2013 14:40:42 ET JOB#: [id number] Marland McalpineSHEIKH A TEJAN-SIE MD ELECTRONICALLY SIGNED 06/17/2013 13:46

## 2015-02-19 NOTE — Discharge Summary (Signed)
PATIENT NAME:  Misty PartridgeHUGHES, Sherelle G MR#:  [id number] DATE OF BIRTH:  December 11, 1940  DATE OF ADMISSION:  04/30/2013 DATE OF DISCHARGE:  05/04/2013  ADMITTING PHYSICIAN: Marlaine HindAmir Darwish, MD  DISCHARGING PHYSICIAN:  Bluford MainSheikh Tejan-Sie, MD assisted by nurse practitioner Imelda Pillowhelsa Holland.  DISCHARGE DIAGNOSES: 1.  Partial small bowel obstruction.  2.  Hypernatremia.   SECONDARY DIAGNOSES: 1.  History of collagenous colitis.  2.  Hypertension.  3.  Anemia.   CONSULTATIONS: Gastroenterology.   PROCEDURE: Upper GI endoscopy by Dr. Bluford Kaufmannh.  HOSPITAL COURSE: This lady was admitted through the Emergency Room complaining of abdominal pain, intractable nausea and vomiting with an episode of coffee ground emesis. Please refer to the history and physical for details. A CT scan in the ED revealed findings consistent with possible partial small bowel obstruction. She was admitted to the medical floor, kept n.p.o., and underwent an EGD the following day by Dr. Bluford Kaufmannh. 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 This was dilated by Dr. Bluford Kaufmannh without any complications. The patient'Icela Glymph abdominal pain gradually improved. Her diet was advanced from clear liquids to full diet without any complications. The patient was hypernatremic at presentation. I corrected this with hypotonic fluid, namely D5W, with  normalization of her sodium. The patient'Dasani Thurlow hemoglobin remained fairly stable. She did not require any blood transfusions. The patient'Kentavious Michele remaining hospital course was otherwise uncomplicated. She was discharged to home in satisfactory condition.   DISCHARGE MEDICATIONS: Please refer to discharge medical reconciliation.   DISCHARGE ACTIVITY: As tolerated.   DISCHARGE FOLLOWUP:  In 1 to 2 weeks with Dr. Ellsworth Lennoxejan-Sie.    DISCHARGE DIET: Low sodium.   DISCHARGE DISPOSITION: Satisfactory.   DISCHARGE PROCESS TIME SPENT: 33 minutes.  ____________________________ Silas FloodSheikh A.  Ellsworth Lennoxejan-Sie, MD sat:sb D: 05/20/2013 13:26:38 ET T: 05/20/2013 13:55:22 ET JOB#: [id number]  cc: Sheikh A. Ellsworth Lennoxejan-Sie, MD, <Dictator> Charlesetta GaribaldiSHEIKH A TEJAN-SIE MD ELECTRONICALLY SIGNED 05/22/2013 9:09

## 2015-02-19 NOTE — Discharge Summary (Signed)
PATIENT NAME:  [name], Misty G MR#:  [id number] DATE OF BIRTH:  11-May-1941  DATE OF ADMISSION:   DATE OF DISCHARGE:  06/03/2013  ADMITTING PHYSICIAN: Dr. Shaune PollackQing Chen  PRIMARY CARE PHYSICIAN: Dr. Ellsworth Lennoxejan-Sie DISCHARGE DIAGNOSES:  1.  Encephalopathy. 2.  Electrolyte abnormalities, resolved.   PROCEDURES: CT scan of the head x 2. Electroencephalogram.   CONSULTATIONS:  1.  Neurology, Dr. Theora MasterZachary Potter.  2.  Psychiatry, Dr. Jaclynn MajorGreason.   DISCHARGE MEDICATIONS:  1.  Lyrica 150 mg twice a day. 2.  Dexilant 60 mg once a day.  3.  Levothyroxine 50 mcg daily.  4.  Tandem 1 capsules daily.  5.  Citalopram 20 mg daily.  6.  Methocarbamol 750 mg one half to 1 tab t.i.d. 7.  Tizanidine 2 mg t.i.d.  8.  Carvedilol 25 mg b.i.d.   Dictation ends here.  Please see addendum for continuation of dictation. ____________________________ Silas FloodSheikh A. Ellsworth Lennoxejan-Sie, MD sat:aw D: 06/03/2013 13:19:00 ET T: 06/03/2013 14:40:42 ET JOB#: [id number]  cc: Marland McalpineSheikh A. Ellsworth Lennoxejan-Sie, MD, <Dictator>

## 2015-02-19 NOTE — Consult Note (Signed)
EGD showed mild GE junction stricture, which dilated to 54 Fr, and mild gastritis. Gastric bx's taken. No active bleeding. Since patient passing flatus. Will start clear liquid diet and advance to full liquid if tolerated. Dr. Mechele CollinElliott will cover over the weekend. Thanks.  Electronic Signatures: Lutricia Feilh, Gage Treiber (MD)  (Signed on 03-Jul-14 13:28)  Authored  Last Updated: 03-Jul-14 13:28 by Lutricia Feilh, Ady Heimann (MD)

## 2015-02-19 NOTE — Consult Note (Signed)
PATIENT NAME:  Misty Medina, Misty Medina MR#:  [id number] DATE OF BIRTH:  10/05/1941  DATE OF CONSULTATION:  05/28/2013  REFERRING PHYSICIAN:  Marland Mcalpine A. Ellsworth Lennox, MD CONSULTING PHYSICIAN:  Izola Price. Jaclynn Major, MD  REASON FOR CONSULTATION: Delirium.  CHIEF COMPLAINT: "Sarah."   HISTORY OF PRESENT ILLNESS: Misty Medina is a 74 year old female who was admitted to medicine with a history of hypertension, hyperlipidemia, chronic anemia.  Misty Medina was brought in to the hospital by Misty Medina, Olegario Messier, who reports that Misty Medina called Misty and said, "I don't feel good, I don't know, I don't know." This was at 10:00 a.m. on the day of admission, 05/27/2013.  Misty Medina Medina reports that Misty Medina lives by herself and has a Research scientist (physical sciences) that comes in for about 2 hours a day to help Misty. Misty Medina reports that Misty Medina has been putting Misty Medina's medicines in a medicine box for the last year because Misty Medina would get confused about them otherwise.  Misty Medina reports that Misty Medina talked to Misty Medina on the day before Misty Medina called saying Misty Medina did not feel good and that Misty Medina was fine and Misty usual self.  Misty Medina goes on to say that Misty Medina takes Ambien, which is not on Misty medication list, and that they started doing Misty medicine box because they were worried that Misty Medina was taking too much Ambien.  Misty Medina also reports that Misty Medina does not eat right, that Misty Medina stays in bed all day and sleeps and gets up at night. Misty Medina reports that Misty Medina was in the hospital about a year ago with colitis and dehydration and did very well after discharge, but within 2 months had reverted back to Misty usual lifestyle.   Misty Medina also reports that Misty Medina comes to the hospital every few weeks and has one medical complaint or another. Misty Medina says that at those times Misty Medina will appear to be "a little off," but nothing like before this admission.   Misty Medina could not talk with me. When I asked Misty a question Misty Medina appeared to answer it, but the answers made no  sense, and most of the time Misty Medina just said Misty cat's name, "Maralyn Sago" as an answer.   PAST MEDICAL HISTORY: Insomnia, chronic anemia, hyperlipidemia, hypertension, IBS, colitis, GERD, hiatal hernia and a history of depression.   SOCIAL HISTORY: Misty Medina lives alone, with Misty Medina checking on Misty often and a healthcare professional coming in for about 2 hours a day.   HOME MEDICATIONS: Acetaminophen/hydrocodone 325/5 mg p.o. t.i.d. p.r.n., alprazolam 0.25 mg p.o. b.i.d., atropine diphenoxylate 0.025/2.5 mg tabs 1 tab 1 to 2 times a day p.r.n. for diarrhea, Coreg 25 mg p.o. b.i.d., citalopram 25 mg p.o. daily, Dexilant 60 mg p.o. 30 minutes before breakfast, levocetirizine 5 mg p.o. daily, levothyroxine 50 mcg p.o. daily, Lyrica 150 mg p.o. b.i.d., methocarbamol 750 mg 1/2 to 1 tablet t.i.d.  p.r.n. for muscle spasms, Zocor 40 mg p.o. at bedtime, Tandem 162/115.2 mg cap daily, tizanidine 2 mg p.o. t.i.d., tramadol 50 mg p.o. q.6 hours p.r.n., zolpidem 10 mg p.o. at bedtime. It should be noted that Misty Medina also reports that Misty Medina was on fluoxetine, is not sure if Misty Medina is taking it now, but knows that Misty Medina takes Ambien now also.   PLAN:  1.  Agree with stopping tramadol, Lyrica, methocarbamol, tizanidine and zolpidem.  2.  Please reconsult [place] if necessary.    ____________________________ Izola Price Jaclynn Major, MD fcg:jm D: 05/28/2013 15:19:19 ET T: 05/28/2013 16:24:21 ET  JOB#: [id number]  cc: Izola PriceFrances C. Jaclynn MajorGreason, MD, <Dictator> Maryan PulsFRANCES C Dequarius Jeffries MD ELECTRONICALLY SIGNED 06/02/2013 8:42

## 2015-02-19 NOTE — Discharge Summary (Signed)
PATIENT NAME:  Misty Medina, Misty Medina MR#:  [id number] DATE OF BIRTH:  10/18/1941  DATE OF ADMISSION:  06/26/2013 DATE OF DISCHARGE:    DISCHARGE DIAGNOSES:   1.  Metabolic encephalopathy.  2.  Urinary tract infection.  3.  Hypothyroidism.  4.  Electrolyte abnormalities.  5.  Hypertension.   DISCHARGE MEDICATIONS:  1.  Dexilant 60 mg daily.  2.  Levothyroxine 50 mcg daily.  3.  Tandem 162, one capsule daily.  4.  Carvedilol 25 mg b.i.d.  5.  Simvastatin 40 mg at bedtime. 6.  Zyrtec 10 mg daily.  7.  Cholestyramine Light once a day at bedtime with 8 ounces of water.  8.  Trazodone 50 mg at bedtime p.r.n. for insomnia.  9.  Citalopram 10 mg at bedtime  10. see med rec 1 tab every 12 hours x 3 days.   DIET: Low-sodium. Consistency: Dysphagia 1 pureed diet with nectar-thick liquids.   HOSPITAL COURSE: The patient was admitted through the Emergency Room, presenting with acute delirium while she was staying with her daughter. She was brought to the Emergency Room, where urinalysis revealed findings consistent with a urinary tract infection and her delirium was attributed to this. The patient was admitted to the medical floor, obtained a psychiatric consult who recommended discontinuation of all psychotropic medications, sedatives, analgesics narcotic-based, and use only trazodone for insomnia. The patient'Solymar Grace mental status failed to improve over the next 7 days. Obtained a neurology consult with Dr. Malvin JohnsPotter who performed an EEG that was notable only for metabolic encephalopathy without any wave pattern suggestive of epilepsy. The patient also exhibited hyponatremia, for which fluids were adjusted. She gradually became more awake, alert and oriented. By the time of discharge, she was oriented x 3 with normal speech and engaging personality. The patient is being discharged to a skilled nursing facility for further rehab.   IMAGING: CT scan of the head x 2, chest x-ray.   CONSULTATIONS: Neurology with Dr.  Malvin JohnsPotter, psychiatry with Dr. Garnetta BuddyFaheem.   DISCHARGE PROCESS TIME SPENT: 35 minutes.   ____________________________ Silas FloodSheikh A. Ellsworth Lennoxejan-Sie, MD sat:jm D: 07/08/2013 14:01:57 ET T: 07/08/2013 15:37:54 ET JOB#: [id number]  cc: Marland McalpineSheikh A. Ellsworth Lennoxejan-Sie, MD, <Dictator> Charlesetta GaribaldiSHEIKH A TEJAN-SIE MD ELECTRONICALLY SIGNED 07/23/2013 14:12

## 2015-02-19 NOTE — Consult Note (Signed)
PATIENT NAME:  Misty Medina, Misty Medina  DATE OF CONSULTATION:  06/26/2013  REFERRING PHYSICIAN:  Bluford MainSheikh Tejan-Sie, MD CONSULTING PHYSICIAN:  Ardeen FillersUzma S. Garnetta BuddyFaheem, MD  REASON FOR CONSULTATION: Delirium.  HISTORY OF PRESENT ILLNESS: The patient is a 74 year old female who was recently discharged from the hospital, readmitted again due to episodes of delirium and encephalopathy of undetermined etiology. She was discharged to a skilled nursing facility. She again presented for symptoms of confusion which were different from her baseline.   During my interview, the patient was noted to be sitting in her bed and was sticking her tongue out. When I asked her why she was here, she reported that she does not know. Her family members were not present for further questioning.   According to her previous evaluation which was done by Dr. Jaclynn MajorGreason on 05/28/2013, she presented in similar circumstances and was taking an excessive amount of psychotropic medications which were stopped at that time.   However, during her current admission, it was noted that she was restarted back on her psychotropic medications including Ambien, Lyrica, as well as alprazolam, Zanaflex, tramadol and Norco, which might be playing a role in the patient becoming delirious again. All these medications were stopped at her previous admission and advised that patient should not be given those medications as they are causing the patient to becoming more delirious and not able to maintain herself.   The patient was discharged to a skilled nursing facility at that time after she improved on her symptoms. The patient was able to move her arms and legs during this interview; however, she was unable to provide any significant history. Her nursing staff reported that she is most of the time cooperative and is able to answer the questions. Her daughter is also involved in her care and her daughter has mentioned to the  staff that the patient is able to answer simple questions.   PAST PSYCHIATRIC HISTORY: The patient has history of depression and anxiety and she is currently on the combination of citalopram and alprazolam, but those medications will be changed. I will decrease the dose of the medications.   PAST MEDICAL HISTORY: Hiatal hernia with GERD, IBS, hypertension, hyperlipidemia, chronic anemia, insomnia and depression.   PAST SURGICAL HISTORY: Cholecystectomy and total abdominal hysterectomy.   FAMILY HISTORY: Father passed away at the age of 74 due to MI and mother passed away from complications of heart failure.   SOCIAL HISTORY: The patient currently lives at assisted living facility and her daughter is very supportive of her. She is awake, alert and is fully ambulatory.   ALLERGIES: CODEINE AND PENICILLIN.   CURRENT HOME MEDICATIONS: Zyrtec 10 mg daily, Zanaflex 2 mg b.i.d.,  tramadol 50 mg every 6 hours, Tandem 162 mg 1 capsule once a day, Lyrica 150 mg b.i.d., levothyroxine 50 mcg once daily, Dexilant 60 mg one daily,  cholestyramine once a day at bedtime, carvedilol 25 mg b.i.d., Ambien 5 mg at bedtime, alprazolam 0.25 mg b.i.d., acetaminophen 1 tablet 3 times per day.   PHYSICAL EXAMINATION:  VITAL SIGNS: Temperature 99.5, pulse 89, respirations 26. Blood pressure 158/70.   LABORATORIES: Include glucose 107, BUN 11, creatinine 1.16, sodium 144, potassium 3.3, chloride 108, bicarbonate 26, GFR 54, anion gap 10, osmolality 287, calcium 8.5, protein 5.8, albumin 2.9, bilirubin 0.6. AST is 46. ALT 20.   UDS positive for benzodiazepines and opioids.   WBC 7.5, RBC 3.57, hemoglobin 10.8, hematocrit 32, RDW 13.9.  MENTAL STATUS EXAMINATION: The patient is an older-looking female who appeared her stated age. She was calm and cooperative. She maintained fair eye contact. Her speech was low and difficult to understand. She did not answer many of the questions. Her mood was flat and depressed.  Affect was congruent, but she demonstrated poor insight regarding her issues. She does not have any perceptual disturbances. She denied having any suicidal ideations or plans.   DIAGNOSTIC IMPRESSION:  AXIS I: Mood disorder, not otherwise specified.  AXIS II: None.  AXIS III: Please review the medical history.   TREATMENT PLAN: I will again advise to stop giving patient all the medications which are causing her to become delirious, as the combination of medications is affecting her adversely.   1.  I will decrease the dose of citalopram to 10 mg at bedtime.  2.  I will also give her trazodone 25 mg p.o. at bedtime p.o. nightly p.r.n. for insomnia.  3.  PLEASE DO NOT RESTART HER BACK ON AMBIEN, LYRICA, ALPRAZOLAM, AS THESE MEDICATIONS ARE CAUSING ACUTE DELIRIUM IN THIS PATIENT AND ARE CAUSING HER TO BE READMITTED TO THE HOSPITAL WITHIN TWO WEEKS.     Thank you for allowing me to participate in the care of this patient.    ____________________________ Ardeen Fillers. Garnetta Buddy, MD usf:np D: 06/26/2013 15:57:00 ET T: 06/26/2013 16:33:39 ET JOB#: [id number]  cc: Ardeen Fillers. Garnetta Buddy, MD, <Dictator> Rhunette Croft MD ELECTRONICALLY SIGNED 07/03/2013 13:56

## 2015-02-19 NOTE — Consult Note (Signed)
Pt seen and examined. Please see Misty Medina's notes. Pt with signif nausea/vomiting/coffee ground emesis. Poor historian. Hx of collagenous colitis (3/13) but not on any meds for colitis. No diarrhea currently but having flatus/bowel movements. Hx of gastritis in the past (8/10). Hungry and nauseous. Plan EGD this afternoon. Thanks.   Electronic Signatures: Lutricia Feilh, Joleigh Mineau (MD) (Signed on 03-Jul-14 13:00)  Authored   Last Updated: 03-Jul-14 13:04 by Lutricia Feilh, Rasul Decola (MD)

## 2015-02-19 NOTE — H&P (Signed)
PATIENT NAME:  Misty Medina, Misty Medina MR#:  [id number] DATE OF BIRTH:  1941/10/20  DATE OF ADMISSION:  06/26/2013  PRIMARY CARE PHYSICIAN:  Dr. Bluford MainSheikh Tejan-Sie.  REFERRING PHYSICIAN:  Dr. Suella BroadLinda Taylor, ER.  HISTORY OF PRESENT ILLNESS:  Ms. Misty Medina is a 74 year old Caucasian female with a past medical history of hypertension, hyperlipidemia and gastroesophageal reflux disease.  She was recently at our hospital for an episode of delirium/encephalopathy of undetermined etiology.  She was originally admitted from 05/27/2013 until 06/03/[facility] for altered mental status during which she was evaluated by both psychiatry as well as neurology.  She was initially treated with Haldol for delirium which actually worsened her symptoms.  She had an EEG which showed generalized slowing consistent with encephalopathy.  Without any active therapeutic intervention she made some improvement and was able to be transferred to a skilled nursing facility.  She is presenting today for similar symptoms from her skilled nursing facility where she became more confused from her baseline, which is awake, alert, oriented x 3, and self-ambulatory according to her family.  They were not present for further questioning.    REVIEW OF SYSTEMS:   Unable to obtain secondary to the patient's mental status.    PAST MEDICAL HISTORY:  Hiatal hernia, gastroesophageal reflux disease, IBS, hypertension, hyperlipidemia, chronic anemia, insomnia and depression.   PAST SURGICAL HISTORY:  Cholecystectomy and total abdominal hysterectomy.   FAMILY HISTORY:  Father died at age 74 from a heart attack.  Mother died at 1788 from complications of heart failure.    SOCIAL HISTORY:  Denies any alcohol or tobacco or drug usage.  Before her previous hospitalization she lived at home with her daughter visiting at times for care.  Currently she is residing at a skilled nursing facility though her baseline is awake, alert, and oriented x 3 and fully  ambulatory.    ALLERGIES:  CODEINE AS WELL AS PENICILLIN.  HOME MEDICATIONS:  Include Lyrica 150 mg by mouth twice daily, Zyrtec 10 mg by mouth daily, Dexilant 60 mg by mouth daily, Synthroid 50 mcg by mouth daily, Tandem 162 mg/115.2 mg once daily, citalopram 20 mg by mouth daily, Coreg 25 mg by mouth twice daily, simvastatin 40 mg by mouth at bedtime, alprazolam 0.25 mg by mouth twice daily, cholestyramine mix one pack in 8 ounces of water daily at bedtime, Ambien 5 mg by mouth at bedtime for sleep as needed, Zanaflex 2 mg by mouth three times daily as needed for muscle spasm, tramadol 50 mg by mouth every six hours as needed for pain, Norco 325/5 mg by mouth three times daily as needed for pain.    PHYSICAL EXAMINATION:  VITAL SIGNS:  Temperature 99.5, pulse 85, respirations 26, blood pressure 185/83, O2 saturation 99% on room air.  GENERAL:  She is awake as well as alert, oriented x 0.  She is unable to repeat her name or any further identifying information.  She is unable to state the date or really any other facets of orientation.   HEENT:  Normocephalic, atraumatic.  Unable to assess extraocular muscles.  The patient is uncooperative.  Pupils are equal, round, reactive to light as well as accommodation.  She has dry mucosal membranes. CARDIOVASCULAR:  S1, S2, regular rate and rhythm.  No murmurs, rubs or gallops. PULMONARY:  Clear to auscultation bilaterally without wheezes, rubs or rhonchi.  ABDOMEN:  Soft, mild tenderness over the epigastric region without rebound, guarding or motion tenderness.  She has hypoactive bowel sounds.  EXTREMITIES:  Reveal no cyanosis, edema or clubbing.  NEUROLOGIC:  Unable to fully assess though Ms. Ibe is able to follow basic commands when prompted repeatedly.    LABORATORY DATA:  Sodium 144, potassium 3.3, chloride 108, bicarbonate 26, BUN 11, creatinine 1.16, glucose 107, calcium 8.5, total protein 5.8, albumin 2.9, alkaline phosphatase 117, AST 46, ALT  20.  WBC 7.5, hemoglobin 10.8, hematocrit of 32, platelets 216.  Acetaminophen 3, salicylates less than 1.7.    ASSESSMENT AND PLAN:  A 74 year old Caucasian female with history of hypertension, hyperlipidemia, gastroesophageal reflux disease, recent episode of delirium/encephalopathy of undetermined etiology with a recent admission to [facility] from 05/27/2013 to 06/03/2013, for altered mental status.  She was evaluated by psychology and neurology.  She had an EEG, which showed generalized slowing and encephalopathy.  No cause was found.  Without therapeutic intervention she did make improvements and was able to be transferred to a skilled nursing facility.  Further documentation the patient has progressed with skilled rehab services and will benefit from home PT upon discharge.  She was apparently doing quite well per the documentation at the skilled nursing facility, however, today she is presenting with similar symptoms from the nursing facility which became more confused from her baseline, which was apparently awake, alert, oriented x 3 according to the family who are not here for further questioning.   1.  Delirium of unclear etiology.  We will rule out any infectious etiologies.  We will check a urinalysis at this time, as well as check a flat plate to rule out constipation, which may also be a cause given she does have hypoactive bowel sounds and mild abdominal tenderness on palpation.  We will also check a TSH for completeness.  She may once again require neurology and psychology consults.  We will adjust her medication list including holding of her sedatives, antihistamine and pain medications as tolerated.  We will particularly hold her Lyrica, Zyrtec, Tandem, alprazolam, Ambien, Zanaflex, tramadol and Norco as polypharmacy may be playing a role.   2.  Hypertension.  We will continue with Coreg. 3.  Hyperlipidemia.  Continue simvastatin 40 mg by mouth at bedtime. 4.  Hypothyroidism.  We will check a TSH  and continue her home dosage of Synthroid.  5.  Depression.  Continue with her home dosage of Celexa.   6.  Protein malnutrition with serum albumin of 2.9.  We will encourage by mouth intake.   7.  Hypokalemia of 3.3.  Replace potassium. 8.  Deep vein thrombosis prophylactic, SCDs.   Unable to assess code status TOTAL TIME SPENT:  34 minutes.  ____________________________ Cletis Athens. Lyrah Bradt, MD dkh:ea D: 06/26/2013 00:51:38 ET T: 06/26/2013 01:49:17 ET JOB#: [id number]  cc: Cletis Athens. Aundrea Higginbotham, MD, <Dictator> Arris Meyn Synetta Shadow MD ELECTRONICALLY SIGNED 06/26/2013 5:45

## 2015-02-19 NOTE — Consult Note (Signed)
PATIENT NAME:  Misty Medina, Misty Medina MR#:  [id number] DATE OF BIRTH:  26-Feb-1941  DATE OF CONSULTATION:  07/01/2013  CONSULTING PHYSICIAN:  Pauletta BrownsYuriy Mattye Verdone, MD  REASON FOR CONSULTATION: Altered mental status.   HISTORY OF PRESENTING ILLNESS: This 74 year old Caucasian female with past medical history of hypertension, hyperlipidemia, GERD, status post discharge [facility] a [facility] on [facility].  Presentation was altered mental status at that time and similar presentation to the hospital on this evaluation.  During the past visit, she was evaluated with a CAT scan the head.  It did not show any acute intracranial abnormality. She also received EEG that showed diffuse slowing. She was on p.r.n. Haldol and she made an improvement and she was able to be transferred back to the nursing facility. At this time, she presents with similar symptoms, difficult to wake up, confusion, disorientation. She was found to have a urinary tract infection and started on Cipro. She has been here for 4 days now without any significant improvement.   REVIEW OF SYSTEMS:  Unable to obtain due to patient's condition.   PAST MEDICAL HISTORY: Hiatal hernia, GERD, IBS, hypertension, hyperlipidemia, chronic anemia, insomnia and depression.   PAST SURGICAL HISTORY: Cholecystectomy.   FAMILY HISTORY: Father died at age of 74 from a heart attack.   SOCIAL HISTORY: Denies any alcohol, tobacco, or drug use. She is currently a resident of a nursing home facility.   HOME MEDICATIONS: Include Lyrica, Zyrtec, Dexilant, Synthroid, Coreg, Ambien, Zanaflex, tramadol and Norco.   RADIOLOGY: CAT scan of the head did not show any acute intracranial abnormalities.    LABORATORY WORKUP:  BUN 8, creatinine is 0.89, sodium 133, potassium is 3.5, chloride 103, white blood cell count is slightly elevated at 12.3 with hemoglobin 12.6, hematocrit is 36.7.   PHYSICAL EXAMINATION: VITAL SIGNS: 156/89, pulse 111, temperature is 37.7.  PHYSICAL EXAMINATION:  The patient does not respond to verbal commands, does not follow any commands. Sometimes moans from painful stimuli. Does respond to visual threats. Appears to be moving all her extremities to painful stimuli. Reflexes are diminished. There does not appear to be any neck stiffness.  Current Brudzinski sign appeared to be negative.  IMPRESSION: A 74 year old Caucasian female with past medical history of hypertension, hyperlipidemia, gastroesophageal reflux disease, [facility] with a similar picture of delirium encephalopathy, discharge on 06/03/2013 with improvement of altered mental status, status post EEG at that time on 05/30/2013 that showed generalized slowing. She was transferred back to the nursing facility and came back with again altered mental status. On current evaluation, the patient does not follow any commands and grimaces and moans to pain. No focal neurological findings found.  At baseline, apparently the patient is awake, alert, oriented x 3. At this point, we will continue treating for urinary tract infection, as it is very common for patients with a urinary tract infections to have prolonged confusion and disorientation despite treatment. Check her ammonia level. At this point I agree with the EEG. I do not think she needs a lumbar puncture unless she spikes a fever or has meningeal signs which she currently does not have, but she does have slight elevated white count of 12.7.  Will follow up after EEG is performed.     ____________________________ Pauletta BrownsYuriy Sadi Arave, MD yz:dp D: 07/01/2013 12:58:19 ET T: 07/01/2013 13:08:02 ET JOB#: [id number]  cc: Pauletta BrownsYuriy Avana Kreiser, MD, <Dictator> Pauletta BrownsYURIY Kamelia Lampkins MD ELECTRONICALLY SIGNED 07/14/2013 12:12

## 2015-02-19 NOTE — Consult Note (Signed)
PATIENT NAME:  Misty Medina, DRAKEFORD MR#:  [id number] DATE OF BIRTH:  08-Mar-1941  DATE OF CONSULTATION:  04/30/2013  REFERRING PHYSICIAN:   CONSULTING PHYSICIAN:  Cristal Deer A. Bobie Caris, MD  REASON FOR CONSULTATION:  Nausea, vomiting, diarrhea, abdominal pain x 2 days.   HISTORY OF PRESENT ILLNESS:  Mrs. Rarick is a pleasant 74 year old female with history of chronic pain in her back and a history of colitis and a small bowel obstruction and iron deficient anemia who presents with two days of nausea, vomiting, diarrhea.  She says that it began suddenly two days ago.  She describes her emesis as constant and unable to keep anything down.  Last meal was yesterday.  Last emesis was the same.  Appetite is poor.  Has also been having watery diarrhea throughout this whole episode.  Last bowel movement was today.  Also has subjective chills, complains of diffuse abdominal pain without a specific focal area.  No obvious fevers, night sweats, shortness of breath, cough, chest pain, dysuria or hematuria.  Does have chronic back pain.   PAST MEDICAL HISTORY: 1.  History of collagenous colitis on a colonoscopy in 2013.  2.  History of small bowel obstruction.  3.  History of anemia.  4.  History of insomnia.  5.  History of GERD.  6.  Depression.  7.  Hypercholesterolemia.  8.  Hypertension.  9.  Status post cholecystectomy.  10.  Status post hysterectomy.  11.  Back pain and fracture.  Had an MRI on the 26th which showed a subacute chronic L1 compression fracture and lumbar spine spondylosis.   HOME MEDICINES:  Are as follows:   1.  Tramadol.  2.  Tizanidine.  3.  Tandem. 4.  Methocarbamol.  5.  Lyrica.  6.  Lisinopril.  7.  L-thyroxine.  8.  Dexilant.  9.  Citalopram.  10.  Carvedilol. 11.  Budesonide. 12.  Atropine diphenoxylate.  13.  Norco.   ALLERGIES:  CODEINE AND PENICILLIN.   SOCIAL HISTORY:  Denies tobacco or alcohol use.  Lives at the [facility] which is an apartment complex, not  assisted living.    REVIEW OF SYSTEMS:  A 12 point review of systems was obtained.  Pertinent positives and negatives as above.   PHYSICAL EXAMINATION: VITAL SIGNS:  Temperature 97.9, pulse 99, blood pressure 125/66, respirations 10, 95% on room air.  GENERAL:  No acute distress.  Alert and oriented x 3.  HEAD:  Normocephalic, atraumatic.  EYES:  No scleral icterus.  No conjunctivitis.  FACE:  No obvious facial trauma.  Normal external nose.  Normal external ears.  Edentulous.  CHEST:  Lungs clear to auscultation, moving air well.  HEART:  Regular rate and rhythm.  No murmurs, rubs or gallops.  ABDOMEN:  Soft, diffusely tender, but no obvious focal areas of peritonitis, nondistended, hyperactive bowel sounds.  EXTREMITIES:  Moves all extremities well.  Strength 5 out of 5.  NEUROLOGIC:  Cranial nerves II through XII grossly intact.  Sensation intact to all four extremities.   LABORATORY, RADIOLOGICAL AND DIAGNOSTIC DATA:  Personally reviewed Mrs. Raboin' labs.  Pertinent positives and negatives as above.  White cell count of 11.8 with a 60% neutrophil predominance, hemoglobin 11.1, hematocrit 34.6, platelets are 226.  LFTs are normal.  CMP, sodium is significant for 148, chloride is 114, bicarbonate is 26, BUN is 37, creatinine is 1.21.   CT scan shows a mild dilation of the stomach and proximal small bowel without any evidence of transition point,  was not given oral contrast, nonspecific.   ASSESSMENT AND PLAN:  Mrs. Kizzie BaneHughes is a pleasant [age] year old female who presents with nausea, vomiting, diarrhea, appears to be dehydrated due to this, complains of abdominal pain, but it is not focally tender.  CT scan is relatively nonspecific as she is having bowel movements and nausea, vomiting.  I do believe this is more clinically consistent with a gastroenteritis.  Would recommend admission for rehydration.  No surgical issues at this time.    We will continue to follow with you to ensure resolution.     ____________________________ Si Raiderhristopher A. Oseph Imburgia, MD cal:ea D: 04/30/2013 20:22:35 ET T: 04/30/2013 23:02:42 ET JOB#: [id number]  cc: Cristal Deerhristopher A. Waldron Gerry, MD, <Dictator> Jarvis NewcomerHRISTOPHER A Karen Huhta MD ELECTRONICALLY SIGNED 05/01/2013 19:47

## 2015-02-19 NOTE — Consult Note (Signed)
Brief Consult Note: Diagnosis: Coffee ground emesis.   Consult note dictated.   Recommend to proceed with surgery or procedure.   Orders entered.   Discussed with Attending MD.   Comments: Patient seen and evaluated. Persistent nausea/vomiting, appearing like coffee-grounds x2 days. patient denies any known hx of gerd, reflux, PUD. A sliding Hiatal Hernia was noted on CT scan as well as questionable SBO at proximal jejunum. Pt continues to have regular BMs and is passing gas. She is complaining of hunger and thirst. Hgb 10.4.  Recc proceeding with Upper EGD to eval for possible UGIB/ cause of nausea and vomiting. Continue PPI and antiemetics PRN. ctm h&h. Further reccs pending EGD.  Full consult dictated.  Electronic Signatures: Ashok Cordiaarle, Yehia Mcbain M (PA-C)  (Signed 03-Jul-14 13:11)  Authored: Brief Consult Note   Last Updated: 03-Jul-14 13:11 by Ashok CordiaEarle, Sukhraj Esquivias M (PA-C)

## 2015-02-19 NOTE — H&P (Signed)
PATIENT NAME:  Misty Medina, Misty Medina MR#:  [id number] DATE OF BIRTH:  01/25/1941  DATE OF ADMISSION:  04/30/2013  PRIMARY CARE PHYSICIAN: Dr. Ellsworth Lennoxejan-Sie.   REFERRING PHYSICIAN: Dr. Maricela BoLuna Ragsdale.   CHIEF COMPLAINT: A 2-day history of nausea and vomiting.   HISTORY OF PRESENT ILLNESS: Misty Medina is a 74 year old Caucasian female who was in her usual state of health until about 2 days ago when she started to have nausea and vomiting at least twice a day. Reporting also coffee-ground hematemesis and recurrent crampy abdominal pain on the left side moderate to severe. Reports mild diarrhea. No hematochezia or melena. Denies any fever. The patient got dehydrated and was unable to hydrate himself. Finally, she developed generalized weakness. EMS was called, and the patient was transported to the Emergency Department for evaluation. Here, workup with CT scan of the abdomen revealed suspicion for a partial bowel obstruction. Proximal jejunal small bowel obstruction is raised. There is also large sliding hiatal hernia. There is mild left base infiltrate. Mild pneumonia cannot be excluded. The patient was admitted to the hospital for further evaluation and treatment.   REVIEW OF SYSTEMS:   CONSTITUTIONAL: Denies any fever. No chills. She developed fatigue.  EYES: No blurring of vision. No double vision.  ENT: No hearing impairment. No sore throat. No dysphagia.  CARDIOVASCULAR: Denies any chest pain. No shortness of breath. No edema. No syncope.  RESPIRATORY: No cough. No chest pain. No hemoptysis.  GASTROINTESTINAL: Reports abdominal pain, crampy in nature, on the left side of the abdomen, nausea and vomiting as above with coffee-ground emesis.  GENITOURINARY: No dysuria. No frequency of urination.  MUSCULOSKELETAL: No joint pain or swelling. No muscular pain or swelling.  INTEGUMENTARY: No skin rash. No ulcers.  NEUROLOGY: No focal weakness. No seizure activity. No headache.  PSYCHIATRY: No anxiety, but she  has history of depression.  ENDOCRINE: No polyuria or polydipsia. No heat or cold intolerance.   PAST MEDICAL HISTORY: History of hiatal hernia and gastroesophageal reflux disease. History of collagenous colitis. Irritable bowel syndrome. Systemic hypertension, hypercholesterolemia, chronic anemia, insomnia and depression.   PAST SURGICAL HISTORY: Cholecystectomy and total abdominal hysterectomy.   FAMILY HISTORY: Her father died at the age of 74 from a heart attack. Her mother died at the age of 74 from complications of congestive heart failure.   SOCIAL HABITS: Nonsmoker. No history of alcohol or drug abuse.   SOCIAL HISTORY: She is widowed. Lives at home alone. Cared for by her daughter who visits her from time to time.   ADMISSION MEDICATIONS: The patient does not know her list of medications and did not bring any of her medicines. According to the old records from a year ago, her medications at that time included Ambien 10 mg at night, aspirin 81 mg a day, Bupap 50/650 q.6 hours p.r.n., Coreg 25 mg twice a day, Dexilant 60 mg once a day, fluoxetine 20 mg twice a day, Celebrex 200 mg a day, hydrochlorothiazide 12.5 mg a day, levothyroxine 50 mcg once a day, lisinopril 20 mg once a day, Lomotil p.r.n., Lyrica 150 mg twice a day, Zyrtec 5 mg a day, Align 4 mg a day. Again, this list of medications is not updated and hopefully in the morning, Dr. Ellsworth Lennoxejan-Sie might bring her updated list.    ALLERGIES: CODEINE CAUSING NAUSEA AND VOMITING. PENICILLIN CAUSING SKIN RASH.   PHYSICAL EXAMINATION:  VITAL SIGNS: Blood pressure 154/61, respiratory rate 16, pulse 89, temperature 97.9, oxygen saturation 95%.  GENERAL APPEARANCE: Elderly female  lying in bed in no acute distress.  HEAD AND NECK: No pallor. No icterus. No cyanosis. Neck is supple. Trachea at midline. No thyromegaly. No cervical lymphadenopathy. No masses. Ear examination revealed normal hearing, no discharge, no lesions. Examination of the nose  showed no bleeding, no ulcers. No discharge. Examination of the mouth showed normal lips and tongue but dry mucous membranes, no oral thrush, no ulcers. She is edentulous and not wearing any of her dentures. Eye examination revealed normal eyelids and conjunctivae. Both pupils are dilated at 5 to 6 mm, round and equal, reactive to light.  HEART: Normal S1, S2. No S3, S4. No murmur. No gallop. No carotid bruits.  RESPIRATORY: Normal breathing pattern without use of accessory muscles. No rales. No wheezing.  ABDOMEN: Soft. No tenderness at the time of my examination. No rebound. No rigidity. No masses. No hernias.  SKIN: No ulcers. No subcutaneous nodules.  MUSCULOSKELETAL: No joint swelling. No clubbing.  NEUROLOGIC: Cranial nerves II through XII were intact. No focal motor deficit.  PSYCHIATRIC: The patient is alert, oriented x3. Mood and affect were normal.   LABORATORY FINDINGS: CAT scan of the abdomen revealed proximal jejunal small bowel obstruction, may be partial. Large sliding hernia. L1 or lumbar 1 moderate compression fracture. This was noted on a prior MRI in June of 2014. There is mild left base infiltrate. Mild pneumonia cannot be excluded. Her EKG showed normal sinus rhythm at rate of 98 per minute. Q waves in lead III. Otherwise unremarkable EKG. Serum glucose 87, BUN 37, creatinine 1.2, sodium 148, potassium 4.7, bicarbonate 26. Lipase 212. Calcium 8.8. Albumin 3. Normal liver transaminases and normal bilirubin. Total CPK 45. Troponin less than 0.02. CBC showed white count of 11,000, hemoglobin 11, hematocrit 34, platelet count 226. Urinalysis was unremarkable.   ASSESSMENT:  1. Nausea, vomiting and coffee-ground emesis consistent with upper gastrointestinal bleed.  2. Partial small bowel obstruction.  3. Hypernatremia.  4. Mild chronic normocytic, normochromic anemia.  5. Systemic hypertension.  6. Hypothyroidism.  7. Irritable bowel syndrome.  8. History of collagenous colitis.   9. Sliding hiatal hernia and gastroesophageal reflux disease.   PLAN: Will admit the patient to the medical floor. Keep n.p.o. IV hydration with normal saline. IV Protonix 40 mg twice a day. Zofran p.r.n. for nausea and vomiting. Surgical consult was obtained with Dr. Juliann Pulse who saw the patient at the Emergency Department and he felt that there was no significant bowel obstruction. He will follow up on her condition. I will also consult gastroenterology to consider upper endoscopy. Again, I will keep the patient n.p.o. due to her vomiting. Also, we do not have her updated medication list. I will place her on Rocephin 1 Medina daily until further clarification of the left lung base infiltrate, whether she has pneumonia or not. The concern with her vomiting whether she has aspiration pneumonia. The patient does not have a Living Will. Her code status is FULL CODE, but she elaborates on that. She wants CPR and life support only for a couple of days and not to exceed that should she need resuscitation or artificial ventilation. For deep vein thrombosis prophylaxis, will place her on TED stockings and compression since chemical anticoagulation is contraindicated due to her upper GI bleed.   Time spent in evaluating this patient and reviewing medical records took more than 1 hour.   ____________________________ Carney Corners. Rudene Re, MD amd:gb D: 05/01/2013 00:06:48 ET T: 05/01/2013 00:44:26 ET JOB#: [id number]  cc: Carney Corners. Rudene Re, MD, <  Dictator> Zollie Scale MD ELECTRONICALLY SIGNED 05/02/2013 6:20

## 2015-02-19 NOTE — Consult Note (Signed)
PATIENT NAME:  Misty Medina, Misty Medina MR#:  [id number] DATE OF BIRTH:  [birth year]  DATE OF CONSULTATION:  05/28/2013  REFERRING PHYSICIAN:  Dr. Ellsworth Lennoxejan-Sie CONSULTING PHYSICIAN:  Misty FujitaZachary E. Malvin JohnsPotter, Misty Medina  CHIEF COMPLAINT:  Confusion and aphasia.   HISTORY OF PRESENT ILLNESS:  A 74 year old woman with a history of hypertension and anemia who lives in independent living, presents because of abrupt onset confusion and aphasia.  During my encounter with the patient, the patient is able to follow some simple commands, but otherwise continues mumbling the name Huntley DecSara, which apparently is the name of her cat.  There is no family in the room to provide additional history.  It seems that this patient's daughter this morning noted that the patient was also having some aphasia as well as the confusion.  This patient has had a brain MRI that was found to be normal.  As mentioned above this patient is typically living in independent living at her facility, is normally even able to drive.  As such this is a marked change from her baseline level of function.  The patient denies being in any pain or distress.  The symptoms are constant and do not come and go.  They are moderate in severity.  Nothing seems to make them better or worse.  They are very persistent.  There are no other focal neurologic deficits identified.   PAST MEDICAL HISTORY:  GERD, hypertension, hyperlipidemia, depression, anemia, colitis, IBS, hiatal hernia, insomnia.   PAST SURGICAL HISTORY:  Total abdominal hysterectomy, cholecystectomy.   FAMILY HISTORY:  Mother, heart failure.  Father, heart failure.   SOCIAL HISTORY:  As above, this patient lives in independent living at her facility.  The patient does not use drugs, alcohol or tobacco.   REVIEW OF SYSTEMS:  Unable to obtain due to altered mental status.   MEDICATIONS:  Hydrocodone, alprazolam, atropine, Coreg, escitalopram, Dexilant, levocetirizine, levothyroxine, Lyrica, methocarbamol, Zocor, Tandem,  tizanidine, tramadol, Zolpidem.    ALLERGIES:  CODEINE, PENICILLINS.    PHYSICAL EXAMINATION:  VITAL SIGNS:  Temperature 97 degrees Fahrenheit, blood pressure 177/74, pulse 90.  GENERAL:  This patient is in no acute distress.  The patient is sitting upright.  She is in no pain.  She is interactive.  HEENT:  Normocephalic and atraumatic.  Eyes, funduscopic exam is within normal limits.  There is no evidence of papilledema.  CARDIOVASCULAR:  S1 and S2 heart sounds are within normal limits.  There are no murmurs, gallops or rubs.  MUSCULOSKELETAL:  Muscle bulk and tone are within normal limits.  I am unable to test for pronator drift because she does not follow these commands due to altered mental status.  Ambulation, I am unable to test her ambulation at the present because she does not follow these commands.  Strength testing is at least 4+ out of 5 in the bilateral upper and lower extremities.  The patient does not follow individual muscle testing commands, but is able to independently move each extremity without any clear focal difficulties.   MENTAL STATUS:  The patient does not answer mental status questions.  She does not follow mental status commands due to altered mental status.  The patient occasionally follows some very simple commands.  She shows me her thumb one time when asked.  The patient continues repeating the name [name].  I asked her if [name] is a person or an animal and she says animal, which is correct.  She does not identify her name, the year or where she  is located.  CRANIAL NERVES:  Visual acuity and visual fields are grossly intact.  Extraocular muscles are grossly intact.  Facial sensation and strength are grossly intact.  Hearing appears intact.  She does not follow commands to test her palate elevation or tongue protrusion.  She does not follow commands for testing shoulder shrug strength due to altered mental status.   SENSATION:  Grossly intact to pain, temperature, position  and vibration throughout.   REFLEXES:  2+ in the bilateral biceps, brachioradialis, patellar and Achilles.   COORDINATION:  Cerebellar, finger-to-nose testing is unable to be performed because the patient does not follow this command.   IMAGING STUDIES:  Head CT is personally reviewed and is unremarkable.  Brain MRI unremarkable.    TESTS:  EKG shows sinus rhythm.   LABORATORY DATA:  Glucose 92.  Electrolytes are within normal limits.  CBC is within normal limits.  Creatinine is 1.2.  Urinalysis is negative for UTI.   ASSESSMENT AND RECOMMENDATIONS:  A 74 year old woman who was previously healthy, found to have abrupt onset and persistent confusion with aphasia.  This patient's neurological examination is limited, but is grossly intact.  There is no clear focal abnormality identified.  I have reviewed her imaging tests and labs and they are also within normal limits.  I would recommend checking a serum ammonia level.  I would recommend checking a routine EEG to evaluate for possible complex partial status epilepticus.  I would recommend trying to limit any medications that could potentially interfere with her cognition.  The patient does not have fevers to suggest encephalitis.  She does not have neck tenderness to suggest meningitis.  If she continues to be altered without any findings on the work-up as mentioned above, then I would consider a spinal tap.     ____________________________ Misty Fujita. Malvin Johns, Misty Medina zep:ea D: 05/29/2013 01:49:00 ET T: 05/29/2013 02:20:04 ET JOB#: [id number]  cc: Misty Fujita. Malvin Johns, Misty Medina, <Dictator>

## 2015-02-19 NOTE — Consult Note (Signed)
   Comments   Case discussed with attending.  and spoke with pt's daughter [phone]). Updated her on pt's status. She agrees that patient is not doing well. We talked about ongoing workup including possibility that without improvement in pt's mental status that pt could be approaching end of life. We also talked about possible option of hospice involvement. Daughter did not seem oposed to this idea but we agreed to take it a day at a time and see how she was tomorrow.  also discussed code status. Daughter does not feel that patient would want to be resucitated (CPR/defib/intubation) and is in agreement with a DNR. Will change code status to reflect this decision.     Electronic Signatures: Borders, Daryl EasternJoshua R (NP)  (Signed 03-Sep-14 15:19)  Authored: Palliative Care   Last Updated: 03-Sep-14 15:19 by Malachy MoanBorders, Joshua R (NP)

## 2015-02-20 NOTE — Discharge Summary (Signed)
PATIENT NAME:  Misty Medina, Misty Medina DATE OF BIRTH:  02-Dec-1940  DATE OF ADMISSION:  04/09/2014 DATE OF DISCHARGE:  04/16/2014   ADMITTING PHYSICIAN: Srikar R. Sudini, MD  DISCHARGING PHYSICIAN: Silas FloodSheikh A. Ellsworth Lennoxejan-Sie, MD   DISCHARGE DIAGNOSES:  1. Community-acquired pneumonia.  2. Acute bronchitis.  3. Acute diastolic congestive heart failure. 4. Acute kidney injury.  5. Clostridium difficile colitis.  6. History of anxiety disorder.   PROCEDURES: PICC line placement.   IMAGING: Chest x-ray June 11 which confirmed left lobar pneumonia. Repeated on June 15 and 16.   DISCHARGE MEDICATIONS:  1. Tandem 162 one cap daily.  2. Carvedilol 25 mg b.i.d.  3. Biotin 1000 mcg daily. 4. Dexilant 60 mg daily.  5. Foltx vitamin B complex daily.  6. Levothyroxine 50 mcg daily.  7. Simvastatin 20 mg daily.  8. Vitamin D3 1000 units daily.  9. Baclofen 10 mg 1/2 tab 3 times a day.  10. Cholestyramine 4 grams at bedtime.  11. Citalopram 20 mg at bedtime.  12. Zolpidem 5 mg at bedtime.  13. Albuterol nebulizer 3 mL p.r.n. every 8 hours.  14. Chlorthalidone 25 mg daily.  15. Acetaminophen/hydrocodone 10/325 one tab every 6 hours as needed.  16. Lyrica 100 mg b.i.d.  17. Flagyl 500 mg 3 times a day for 14 days.  18. Prednisone 20 mg 2 tablets daily for 5 days.   HOSPITAL COURSE: This lady was admitted through the Emergency Room, presenting with severe shortness of breath, cough and dizziness. Please refer to the history and physical for details. In the Emergency Room, she was found to be markedly hypotensive with acute kidney injury and radiological evidence of left lobar pneumonia. She was admitted to the medical floor, received high-rate intravenous fluids, placed on antibiotics for community-acquired pneumonia, which she tolerated with excellent clinical response. Her kidney function gradually normalized; however, this was complicated by development of pulmonary edema, which responded  to a dose of intravenous Lasix. The patient, at the time of admission, also had acute bronchitis. She received intravenous steroids, specifically methylprednisone, and also responded to that excellently, with resolution of her bronchospasm. The patient developed diarrhea 2 days ago. Her stool was sent for C. difficile, which returned positive yesterday, and started on oral Flagyl and placed on appropriate isolation precautions. The patient denies any diarrhea today. On the day of discharge, her vital signs are stable. She is afebrile with normal blood pressure and is 100% oxygen saturation on room air.   DISCHARGE INSTRUCTIONS:  DIET: Low fat.   ACTIVITY: As tolerated.   FOLLOWUP: Follow up with Dr. Ellsworth Lennoxejan-Sie in 1 to 2 weeks following discharge from skilled nursing facility.   DISPOSITION: Skilled nursing facility.   DISCHARGE PROCESS TIME SPENT: 35 minutes.   ____________________________ Silas FloodSheikh A. Ellsworth Lennoxejan-Sie, MD sat:lb D: 04/16/2014 09:24:34 ET T: 04/16/2014 09:34:31 ET JOB#: [id number]  cc: Sheikh A. Ellsworth Lennoxejan-Sie, MD, <Dictator> Charlesetta GaribaldiSHEIKH A TEJAN-SIE MD ELECTRONICALLY SIGNED 04/18/2014 11:00

## 2015-02-20 NOTE — H&P (Signed)
PATIENT NAME:  Misty Medina, Misty Medina MR#:  [id number] DATE OF BIRTH:  1941-05-05  DATE OF ADMISSION:  04/09/2014  PRIMARY CARE PHYSICIAN: Marland Mcalpine A. Tejan-Sie, MD  CHIEF COMPLAINT: Shortness of breath, cough and weakness.   HISTORY OF PRESENTING ILLNESS: A 74 year old Caucasian female patient with history of hiatal hernia, GERD, chronic anemia, hypertension and pneumonia in February 2015, who presents to the hospital complaining of shortness of breath, cough and generalized weakness. The patient was unable to walk well. On EMS arrival, the patient's saturations were 80%. Here in the Emergency Room, the patient's blood pressure is low, in the 70s to 80s. She did receive 2 boluses of normal saline; still, blood pressure is low at 85/60. The patient's ABG has shown respiratory acidosis with pH of 7.23, with pCO2 of 52.   The patient does not feel any better at this point and is being admitted to the hospitalist service. She does not have any PND, orthopnea, edema. She also has acute renal failure, mentions poor appetite. No chest pain.   PAST MEDICAL HISTORY:  1. Hiatal hernia.   2. GERD.  3. Irritable bowel syndrome.  4. Hypertension.  5. Hyperlipidemia.  6. Chronic anemia.  7. Insomnia.  8. Depression.   PAST SURGICAL HISTORY:  1. Cholecystectomy.  2. Total abdominal hysterectomy.   FAMILY HISTORY: Father died of heart attack, and mother died of complications from heart failure.   ALLERGIES: CODEINE AND PENICILLIN. REACTION TO PENICILLIN UNKNOWN.   REVIEW OF SYSTEMS:  CONSTITUTIONAL: Complains of fatigue and weakness.  EYES: No blurred vision, pain or redness. ENT: No tinnitus, ear pain or hearing loss.  RESPIRATORY: Has cough, shortness of breath, wheezing.  CARDIOVASCULAR: No chest pain, orthopnea, edema.  GASTROINTESTINAL: No nausea, vomiting, diarrhea, abdominal pain.  GENITOURINARY: No dysuria, hematuria or frequency.  ENDOCRINE: No polyuria, nocturia or thyroid  problems. HEMATOLOGIC AND LYMPHATIC: No anemia, easy bruising or bleeding.  INTEGUMENTARY: No acne, rash, lesion.  MUSCULOSKELETAL: No back pain or arthritis.  NEUROLOGICAL: No focal numbness, weakness, seizure.  PSYCHIATRIC: Seems to be very anxious.   HOME MEDICATIONS:  1. Acetaminophen/hydrocodone 1 tablet 4 times a day as needed.  2. Albuterol nebulizer every 8 hours as needed.  3. Baclofen 10 mg 1/2 tablet 3 times a day.  4. Biotin 1000 mcg daily.  5. Coreg 25 mg oral 2 times a day.  6. Citalopram 20 mg oral once a day.  7. Dexilant 60 mg oral once a day.  8. Diphenhydramine 25 mg every 6 hours as needed for allergies.  9. Gabapentin 300 mg oral once a day.  10. Vitamin B complex oral tablet once a day.  11. Guaifenesin 15 mL oral every 6 hours as needed for cough.  12. Levaquin 250 mg oral every 24 hours taken in February.  13. Levothyroxine 50 mcg daily. 14. Loperamide 2 mg oral every 3 hours as needed for diarrhea.  15. Lyrica 100 mg oral 2 times a day.  16. Milk of Magnesia 8% 30 mL oral once a day.  17. Zofran 4 mg oral every 8 hours as needed for nausea or vomiting.  18. Simvastatin 20 mg daily.  19. Tandem 162/115.2 mg oral once a day.  20. Vitamin D3 1000 international units oral once a day.  21. Zinc oxide topical as needed.  22. Zolpidem 5 mg oral at bedtime as needed for insomnia.   PHYSICAL EXAMINATION:  VITAL SIGNS: Temperature 98.2, pulse 75, blood pressure of 85/60, saturating 93% on 4 liters oxygen.  GENERAL: Obese Caucasian female patient sitting up in bed, in significant respiratory distress with 1- to 2-word conversational dyspnea.  PSYCHIATRIC: Alert and oriented x3, anxious.  HEENT: Atraumatic, normocephalic. Oral mucosa dry and pink. External ears and nose normal. Pallor positive. No icterus. Pupils bilaterally equal and reactive to light.  NECK: Supple. No thyromegaly. No palpable lymph nodes. Has an external IJ line.  CARDIOVASCULAR: S1, S2, without  any murmurs. Peripheral pulses 2+. Has 1+ edema.  RESPIRATORY: Has decreased air entry on both sides with increased work of breathing, bilateral wheezing, coarse breath sounds.  GASTROINTESTINAL: Soft abdomen, nontender. Bowel sounds are present.  SKIN: Warm and dry. No petechiae, rash or ulcers.  MUSCULOSKELETAL: No joint swelling or redness in large joints.  NEUROLOGICAL: Motor strength 5-/5 all over.   LABORATORY AND DIAGNOSTIC STUDIES: Include: Chest x-ray shows left base infiltrate.  BUN 42, creatinine 2.38, sodium 141, potassium 4.8, chloride 112. AST, ALT, alkaline phosphatase, bilirubin normal.  Troponin less than 0.02.  WBC 8.3, hemoglobin 9.2, platelets of 128. Differential not available.  EKG shows normal sinus rhythm.  ABG shows pH of 7.23 with pCO2 of 54, pO2 of 70.   ASSESSMENT AND PLAN:  1. Left lower lobe pneumonia with acute respiratory failure and severe sepsis. The patient has hypotension into systolic of 80s. Will bolus another liter of normal saline at this time. The patient will be admitted to CCU. Depending on her response to the fluids, the patient may need central venous access and pressor support, but will review after the bolus of the fluids. Will get blood and sputum cultures. Start her on ceftriaxone and Levaquin at this time. No risk factors for multidrug-resistant organisms found. The patient is critically ill. Discussed with the patient and family at bedside, and she is a full code at this point. Secondary to her significant respiratory acidosis, the patient will be placed on BiPAP and consult pulmonary. Lactic acid is normal at this point. The patient is high risk for intubation.  2. Acute renal failure secondary to acute tubular necrosis from severe sepsis and hypotension. The patient will be on IV fluids. Monitor I's and O's. Repeat in the morning.  3. Anemia of chronic disease, stable.  4. Mild thrombocytopenia. The patient has had similar episodes in the past.  Presently, will be monitored. No bleeding found.  5. Hypertension. The patient is hypotensive. Hold medications.  6. Deep vein thrombosis prophylaxis with heparin.   CODE STATUS: Full code.   CRITICAL CARE TIME SPENT TODAY ON THIS PATIENT: 45 minutes.   ____________________________ Molinda BailiffSrikar R. Kani Chauvin, MD srs:lb D: 04/09/2014 12:59:04 ET T: 04/09/2014 13:19:13 ET JOB#: [id number]  cc: Wardell HeathSrikar R. Jaci Desanto, MD, <Dictator> Sheikh A. Ellsworth Lennoxejan-Sie, MD Orie FishermanSRIKAR R Mica Ramdass MD ELECTRONICALLY SIGNED 04/09/2014 16:41

## 2015-02-20 NOTE — H&P (Signed)
PATIENT NAME:  Misty Medina, Misty Medina MR#:  [id number] DATE OF BIRTH:  Jul 09, 1941  DATE OF ADMISSION:  12/01/2013  ADDENDUM  FAMILY HISTORY: Father died of heart attack. Mother died of complication of heart failure.   ALLERGIES: CODEINE, PENICILLIN.   HOME MEDICATIONS: Medication reconciliation list has not been completed yet. Please follow up.   REVIEW OF SYSTEMS: CONSTITUTIONAL: The patient had fever and chills. No headache or dizziness, but has generalized weakness.  EYES: No double vision, blurry vision.  ENT: No postnasal drip, slurred speech, or dysphagia.  CARDIOVASCULAR: No chest pain, palpitations, orthopnea, or nocturnal dyspnea. No leg edema.  PULMONARY: Positive for cough, sputum, and shortness of breath, but no hemoptysis. No wheezing.  PSYCHIATRIC: No anxiety or depression. GASTROINTESTINAL: No abdominal pain, nausea, vomiting, or diarrhea. No melena or bloody stool.  GENITOURINARY: No dysuria, hematuria, or incontinence.  SKIN: No rash or jaundice.  NEUROLOGIC: No syncope, loss of consciousness, or seizure.  HEMATOLOGIC: No easy bleeding or bleeding.  ENDOCRINOLOGIC: No polyuria, polydipsia, heat or cold intolerance.   PHYSICAL EXAMINATION: VITAL SIGNS: Temperature 98.5, blood pressure 121/54, pulse 78, and O2 saturation 99% on oxygen by nasal cannula. GENERAL: The patient is alert, awake, and oriented, in no acute distress.  HEENT: Pupils round, equal and reactive to light and accommodation.   NECK: Supple. No JVD or carotid bruits. No lymphadenopathy. No thyromegaly.  CARDIOVASCULAR: S1 and S2. Regular rate and rhythm. No murmurs or gallops.  PULMONARY: Bilateral air entry. Bilateral mild crackles. No use of accessory muscles to breathe.  ABDOMEN: Soft. No distention or tenderness. No organomegaly. Bowel sounds present.  EXTREMITIES: No edema, clubbing, or cyanosis. No calf tenderness. Bilateral pedal pulses present.  SKIN: No rash or jaundice.  NEUROLOGIC: Alert and  oriented x3. No focal deficit. Power 5/5. Sensation intact.   LABORATORY AND DIAGNOSTICS: Chest x-ray showed new right apical air space disease consistent with pneumonia.   WBC 20.1, hemoglobin 9.2, platelets 174. Glucose 147, BUN 30, creatinine 1.5, sodium 133, potassium 4.8.   IMPRESSION: 1.  Pneumonia.  2.  Sepsis.  3.  Acute renal failure.  4.  Hyponatremia.  5.  Anemia.   PLAN OF TREATMENT: 1.  The patient will be admitted to medical floor. Will continue Levaquin. Follow up CBC, blood culture, and sputum culture.  2.  For acute renal failure, we will start normal saline IV. Follow up BMP. Hold baclofen and Coreg due to hypotension and acute renal failure.  3.  Gastrointestinal and deep vein thrombosis prophylaxis.   I discussed the patient's condition and plan of treatment and the patient.   TIME SPENT: About 55 minutes.  ____________________________ Shaune PollackQing Solina Heron, MD qc:sb D: 12/01/2013 16:54:42 ET T: 12/01/2013 17:22:11 ET JOB#: [id number]  cc: Shaune PollackQing Montgomery Favor, MD, <Dictator> Shaune PollackQING Rowe Warman MD ELECTRONICALLY SIGNED 12/03/2013 15:10

## 2015-02-20 NOTE — Discharge Summary (Signed)
PATIENT NAME:  Misty Medina, Misty Medina DATE OF BIRTH:  1941-07-06  DATE OF ADMISSION:  12/01/2013 DATE OF DISCHARGE:  12/05/2013  ADMITTING PHYSICIAN:  Dr. Imogene Burnhen.  DICHARGING PHYSICIAN:  Dr. Ellsworth Lennoxejan-Sie.   PROCEDURES: None.  IMAGING:  Chest x-ray on 12/01/2013.   ADMITTING DIAGNOSES:  1.  Pneumonia.  2.  Acute renal failure. 3.  Hyponatremia.   HOSPITAL COURSE: This lady was admitted through the Emergency Room where she presented with cough and several days of dyspnea. Please refer to the history and physical for full details by Dr. Imogene Burnhen. Clinical evaluation and radiological imaging performed showed a right lobar pneumonia. The patient was placed on Levaquin 750 mg with excellent clinical response. Her white cell count improved. She defervesced and oxygen requirements decreased to her room air oxygen. The patient'Misty Medina hospital stay was fairly uncomplicated. She did have acute kidney injury which responded to fluids, as was most likely related to her dehydration.  Patient stay, otherwise uncomplicated, following resolution of her renal failure. She did resume her normal level of activity. The patient was discharged to assisted living in satisfactory condition.   DISCHARGE MEDICATIONS: The patient is to resume home medications.  Additional medication prescribed was Levaquin 750 mg daily x 3 more days to complete course of treatment.   DISCHARGE INSTRUCTIONS:  DIET: Low sodium, low fat.   ACTIVITY: As tolerated.   FOLLOW UP:  With Dr. Ellsworth Lennoxejan-Sie in 1 to 2 weeks.   ____________________________ Silas FloodSheikh A. Ellsworth Lennoxejan-Sie, MD sat:NTS D: 12/13/2013 23:09:40 ET T: 12/14/2013 00:35:24 ET JOB#: [id number]  cc: Sheikh A. Ellsworth Lennoxejan-Sie, MD, <Dictator> Charlesetta GaribaldiSHEIKH A TEJAN-SIE MD ELECTRONICALLY SIGNED 12/15/2013 13:46

## 2015-02-20 NOTE — Discharge Summary (Signed)
PATIENT NAME:  Misty Medina, Misty Medina MR#:  [id number] DATE OF BIRTH:  May 22, 1941  DATE OF ADMISSION:  04/09/2014 DATE OF DISCHARGE:  04/17/2014  ADDENDUM   The patient's discharge was deferred due to unavailability of beds. [facility]. There were no interval acute events overnight and no changes to her discharge medications. The patient was discharged in a satisfactory condition.   ____________________________ Silas FloodSheikh A. Ellsworth Lennoxejan-Sie, MD sat:gb D: 04/17/2014 13:38:19 ET T: 04/17/2014 20:39:29 ET JOB#: [id number]  cc: Sheikh A. Ellsworth Lennoxejan-Sie, MD, <Dictator> Charlesetta GaribaldiSHEIKH A TEJAN-SIE MD ELECTRONICALLY SIGNED 04/18/2014 11:00

## 2015-02-20 NOTE — H&P (Signed)
PATIENT NAME:  [name], Bronnie G MR#:  [id number] DATE OF BIRTH:  Aug 06, 1941  DATE OF ADMISSION:  12/01/2013  ADDENDUM:  PRIMARY CARE PHYSICIAN:  Marland McalpineSheikh A. Ellsworth Lennoxejan-Sie, MD  REFERRING PHYSICIAN:  Su Leyobert L. Kinner, MD  CHIEF COMPLAINT: Cough, sputum, shortness of breath for 2 days.   HISTORY OF PRESENT ILLNESS: A 74 year old female with a history of hiatal hernia, GERD, IBS, hypertension presented to the ED with the above chief complaint.  The patient complains of cough, sputum, shortness of breath for the past 2 days. In addition, the patient has fever, chills and generalized weakness. The patient's blood pressure was low at 88/44, was treated with normal saline IV in the ED.   PAST MEDICAL HISTORY: Hiatal hernia, GERD, IBS, hypertension, hyperlipidemia, chronic anemia, insomnia and depression.   PAST SURGICAL HISTORY: Cholecystectomy and total abdominal hysterectomy,   FAMILY HISTORY: Continued as dictated before.     ____________________________ Shaune PollackQing Arlene Genova, MD qc:cs D: 12/03/2013 15:01:52 ET T: 12/03/2013 15:18:27 ET JOB#: [id number]  cc: Shaune PollackQing Leather Estis, MD, <Dictator> Shaune PollackQING Luke Rigsbee MD ELECTRONICALLY SIGNED 12/03/2013 15:34

## 2015-02-21 NOTE — Discharge Summary (Signed)
PATIENT NAME:  Misty Medina, Misty Medina MR#:  [id number] DATE OF BIRTH:  1941-09-04  DATE OF ADMISSION:  01/18/2012 DATE OF DISCHARGE:  01/25/2012  DISCHARGE DIAGNOSES:  1. Collagenosis colitis.  2. Hypovolemic shock.  3. Acute kidney injury.  4. Electrolyte abnormalities.  5. Chronic bronchitis.   DISCHARGE INSTRUCTIONS: 1. Home with home oxygen.  2. Follow-up with Dr. Ellsworth Lennoxejan-Sie in 1 to 2 weeks. 3. Follow-up with Dr. Mechele CollinElliott in 1 to 2 weeks.   ACTIVITY: As tolerated.   DISCHARGE MEDICATIONS: The patient is to take budesonide per discharge instructions. The patient is to resume other home medications.   HOSPITAL COURSE: This lady was admitted through the Emergency Room with profuse unrelenting diarrhea. She was severely hypotensive when she came in with signs of also acute kidney injury. Please see history and physical for full details.   She was admitted to the Intensive Care Unit where she received high dose intravenous fluids and inotropic support with subsequent improvement in her blood pressure and weaning of inotropes in less than 24 hours. A Gastroenterology consultation was placed with Dr. Mechele CollinElliott who performed a flexible sigmoidoscopy which revealed collagenous colitis on pathology. The patient was started on budesonide which she responded to quite well. Her diarrhea immediately decreased and by the time of discharge her stools are fairly well formed. The patient had electrolyte abnormalities secondary to renal failure. Her acute kidney injury improved dramatically and creatinine was normal within two days of admission. The patient had other hospital complications of hypoxemia attributed to her chronic bronchitis and some degree of atelectasis. She required discharge on home oxygen.   DISPOSITION: The patient was discharged home with home health in satisfactory condition.   PROCEDURE PERFORMED: Flexible sigmoidoscopy.   CONSULTATION: Gastroenterology with Dr. Mechele CollinElliott   DISCHARGE PROCESS  TIME SPENT: 35 minutes.   ____________________________ Silas FloodSheikh A. Ellsworth Lennoxejan-Sie, MD sat:drc D: 02/07/2012 14:09:06 ET T: 02/08/2012 10:20:40 ET JOB#: [id number]  cc: Sheikh A. Ellsworth Lennoxejan-Sie, MD, <Dictator> Charlesetta GaribaldiSHEIKH A TEJAN-SIE MD ELECTRONICALLY SIGNED 02/08/2012 13:33

## 2015-02-21 NOTE — Consult Note (Signed)
Pt with 2 loose stools today.  Started Entocort.  Usually takes a few days to kick in. When diarrhea stops she can go home from a GI standpoint.  No abd pain or tenderness today.  Electronic Signatures: Scot JunElliott, Halston Fairclough T (MD)  (Signed on 27-Mar-13 14:50)  Authored  Last Updated: 27-Mar-13 14:50 by Scot JunElliott, Babara Buffalo T (MD)

## 2015-02-21 NOTE — Consult Note (Signed)
Chief Complaint:   Subjective/Chief Complaint Some nausea and coughing but no diarrhea since lomotil resumed. Off pressors now.   VITAL SIGNS/ANCILLARY NOTES: **Vital Signs.:   24-Mar-13 07:47   Vital Signs Type Routine   Temperature Temperature (F) 100.2   Celsius 37.8   Pulse Pulse 88   Respirations Respirations 22   Systolic BP Systolic BP 320   Diastolic BP (mmHg) Diastolic BP (mmHg) 54   Mean BP 70   Pulse Ox % Pulse Ox % 94   Oxygen Delivery 2L; Nasal Cannula   Pulse Ox Heart Rate 88   Nurse Fingerstick (mg/dL) FSBS (fasting range 65-99 mg/dL) 121   Comments/Interventions  Per Hyperglycemia Protocol (CCU); Nurse Notified   Brief Assessment:   Cardiac Regular    Respiratory clear BS    Gastrointestinal Normal   Routine Hem:  24-Mar-13 02:34    WBC (CBC) 8.5   RBC (CBC) 2.74   Hemoglobin (CBC) 8.7   Hematocrit (CBC) 25.5   Platelet Count (CBC) 119   MCV 93   MCH 31.6   MCHC 34.0   RDW 12.5  Routine Chem:  24-Mar-13 02:34    Glucose, Serum 122   BUN 9   Creatinine (comp) 0.98   Sodium, Serum 144   Potassium, Serum 3.4   Chloride, Serum 106   CO2, Serum 28   Calcium (Total), Serum 6.8  Hepatic:  24-Mar-13 02:34    Bilirubin, Total 0.3   Alkaline Phosphatase 76   SGPT (ALT) 7   SGOT (AST) 17   Total Protein, Serum 5.6   Albumin, Serum 2.3  Routine Chem:  24-Mar-13 02:34    Osmolality (calc) 287   eGFR (African American) >60   eGFR (Non-African American) 60   Anion Gap 10  Routine Hem:  24-Mar-13 02:34    Neutrophil % 79.7   Lymphocyte % 14.7   Monocyte % 5.2   Eosinophil % 0.1   Basophil % 0.3   Neutrophil # 6.8   Lymphocyte # 1.2   Monocyte # 0.4   Eosinophil # 0.0   Basophil # 0.0  Routine Chem:  24-Mar-13 02:34    Magnesium, Serum 1.7   Phosphorus, Serum 1.5   Assessment/Plan:  Assessment/Plan:   Assessment Diarrhea, improving    Plan Ok to send tests for gastrin, VIP, 5 HIAA levels in AM now that patient off pressors. Carole Binning to see patient tomorrow. Thanks.   Electronic Signatures: Verdie Shire (MD)  (Signed [phone] 10:20)  Authored: Chief Complaint, VITAL SIGNS/ANCILLARY NOTES, Brief Assessment, Lab Results, Assessment/Plan   Last Updated: 24-Mar-13 10:20 by Verdie Shire (MD)

## 2015-02-21 NOTE — H&P (Signed)
PATIENT NAME:  Misty Medina, Misty Medina Medina MR#:  [id number] DATE OF BIRTH:  [birth year]  DATE OF ADMISSION:  01/18/2012  PRIMARY CARE PHYSICIAN: Misty Medina Dupre, MD  GASTROENTEROLOGIST: Misty Side, MD  CHIEF COMPLAINT: Severe diarrhea for two weeks, unable to keep anything p.o.  HISTORY OF PRESENT ILLNESS: Misty Medina Misty Medina Medina is a pleasant 74 year old Caucasian female with history of irritable bowel syndrome, history of small bowel obstruction, gastroesophageal reflux disease, depression, and hypercholesterolemia who comes into the Emergency Room with complaints of diarrhea, progressive, with worsening symptoms in the form of increased frequency for the last two weeks. The patient states she is unable to keep anything down p.o. and has significant nausea with low-grade fever. She denies abdominal pain, however, she does fill her abdomen is bloated and distended. She denies any blood in stool in her stool. She has a history of irritable bowel syndrome and has been treated with antibiotics with Cipro and Flagyl in the past. Next, in the Emergency Room, the patient was found to be in hypovolemic/hypotensive shock with systolic blood pressure in the 70s. Her creatinine is 4.6. She is severely dehydrated since her baseline creatinine is 1.6. She is being admitted for further evaluation and management.   PAST MEDICAL HISTORY:  1. Chronic anemia.  2. Insomnia.  3. Gastroesophageal reflux disease.  4. Depression.  5. History of small bowel obstruction.  6. Irritable bowel syndrome with history of chronic diarrhea.  7. Hypercholesterolemia.  8. Hypertension.   PAST SURGICAL HISTORY:  1. Cholecystectomy.  2. Total abdominal hysterectomy.   ALLERGIES: Penicillin and codeine.   MEDICATIONS:  1. Align 4 mg p.o. daily. 2. Ambien 10 mg at bedtime.  3. Aspirin 81 mg daily. 4. Bupap 50/650 mg one tablet every 6 hours as needed.  5. Carvedilol 25 mg twice a day.  6. Celebrex 200 mg daily.  7. Dexilant 60 mg p.o.  daily.  8. Fluoxetine 20 mg twice a day. 9. Hydrochlorothiazide 12.5 mg p.o. daily.  10. Levocetirizine 5 mg daily.  11. Levothyroxine 50 mcg p.o. daily.  12. Lisinopril 20 mg daily.  13. Lomotil 2.5/0.025 mg one as needed.  14. Lyrica 150 mg twice a day.   SOCIAL HISTORY: The patient lives at home with her family. She denies any history of smoking or alcohol use.   FAMILY HISTORY: Father died of myocardial infarction. Mother had congestive heart failure.   REVIEW OF SYSTEMS: CONSTITUTIONAL: Positive for fatigue, weakness, and fever. EYES: No blurred or double vision. ENT: No tinnitus, ear pain, or hearing loss. RESPIRATORY: No cough, wheeze, or hemoptysis. CARDIOVASCULAR: No chest pain, orthopnea, or edema. GASTROINTESTINAL: Positive for nausea, vomiting, diarrhea, abdominal pain, and gastroesophageal reflux disease. History of irritable bowel syndrome. GU: No dysuria or hematuria. ENDOCRINE: No polyuria or nocturia. HEMATOLOGY: Positive for chronic anemia. SKIN: No acne or rash. MUSCULOSKELETAL: Positive for arthritis. NEUROLOGIC: No cerebrovascular accident or transient ischemic attack. PSYCH: No anxiety or depression. All other systems reviewed and negative.   PHYSICAL EXAMINATION:   GENERAL: The patient is awake, alert, and oriented x3, mild to moderate distress due to diarrhea.  VITALS: Afebrile, pulse 85, blood pressure 77/48 and saturation 100% on room air.   HEENT: Atraumatic, normocephalic. Pupils are equal, round, and reactive to light and accommodation. Extraocular movements intact. Oral mucosa is dry.   NECK: Supple. No JVD. No carotid bruit.   LUNGS: Clear to auscultation bilaterally. No rales, rhonchi, respiratory distress, or labored breathing.   HEART: Both heart sounds are normal. Rate and rhythm is  regular. PMI is not lateralized. Chest is nontender.   ABDOMEN: Soft and distended. Mild diffuse tenderness. No organomegaly. Very hypoactive bowel sounds.   EXTREMITIES:   Feeble pedal pulses. Good femoral pulses. No lower extremity edema.   NEUROLOGIC: Cranial nerves II through XII grossly intact. No motor or sensory deficits.   PSYCH: The patient is awake, alert, and oriented x3.   SKIN: Warm and dry.   LABS/STUDIES: Chest x-ray is consistent with asymmetric right lung interstitial thickening.   Cardiac enzymes are negative. Hemoglobin and hematocrit 9.7 and 28.7. White count is 7.6 and platelet count 138. Glucose 110, creatinine 4.65, sodium 138, potassium 5, chloride 111, bicarbonate 14, calcium 7.5. Bilirubin 1.4. LFTs are otherwise normal. Albumin 3.0. Lipase 51.   ASSESSMENT: 74 year old Misty Medina Misty Medina Medina with: 1. Systemic antiinflammatory response syndrome due to diarrhea and possible infection. She has a history of colitis in the past requiring antibiotics.  2. Hypovolemic/hypotensive shock due to severe dehydration.  3. Acute renal failure secondary to severe prerenal azotemia from significant diarrhea.  4. Chronic diarrhea with history of irritable bowel syndrome and colitis in the past.  5. Gastroesophageal reflux disease.  6. Hypothyroidism.    PLAN:  1. Admit the patient to the Critical Care Unit.  2. FULL CODE.  3. IV fluids for hydration.  4. We will start the patient on Levophed.  5. I will start the patient on IV Flagyl and Cipro. Send stool for C. difficile, comprehensive cultures, and fecal WBC's.  6. Gastroenterology consultation.  7. Continue home medications in the form of Synthroid, fluoxetine, Lyrica and simvastatin.  8. SCDs for deep vein thrombosis prophylaxis.  9. The patient is on Dexilant for GI prophylaxis.  10. Follow blood cultures and stool cultures.  11. Followup daily MET-B, ins and outs.   The hospital admission plan was discussed with the patient. No family members were present. Further work-up according to the patient's clinical course. The patient will be signed off to Dr. Pernell Medina.   CRITICAL TIME SPENT: 55  minutes. ____________________________ Misty Rochester Posey Pronto, MD sap:slb D: 01/18/2012 15:40:28 ET T: 01/18/2012 16:28:59 ET JOB#: [id number]  cc: Misty Medina Paddock A. Posey Pronto, MD, <Dictator> Misty Medina A. Elijio Miles, MD Misty Side, MD Ilda Basset MD ELECTRONICALLY SIGNED 02/05/2012 6:34

## 2015-02-21 NOTE — Consult Note (Signed)
Pt with HCO3 of 11, down from 14, will change iv to D5w with 2 amps of HCO3 per liter times 3 bags of iv fluid.  Verbal given to nurse.  will consider flex sig later today for mucosal exam and probable biopsyl  Electronic Signatures: Scot JunElliott, Keyontay Stolz T (MD)  (Signed on 22-Mar-13 07:48)  Authored  Last Updated: 22-Mar-13 07:48 by Scot JunElliott, Kendrah Lovern T (MD)

## 2015-02-21 NOTE — Consult Note (Signed)
Pt biopsy not back yet.  Stool studies neg.  Less diarrhea on lomotil.  Will advance diet and see how she is doing with it.  Biopsy should be back tomorrow.  Electronic Signatures: Scot JunElliott, Shahara Hartsfield T (MD)  (Signed on 25-Mar-13 18:32)  Authored  Last Updated: 25-Mar-13 18:32 by Scot JunElliott, Elynore Dolinski T (MD)

## 2015-02-21 NOTE — Consult Note (Signed)
Chief Complaint:   Subjective/Chief Complaint Covering for Dr. Vira Agar. Still with watery diarrhea. Still on pressors. No bleeding. No abd pain.   VITAL SIGNS/ANCILLARY NOTES: **Vital Signs.:   23-Mar-13 08:01   Vital Signs Type POCT   Nurse Fingerstick (mg/dL) FSBS (fasting range 65-99 mg/dL) 128   Comments/Interventions  Per Hyperglycemia Protocol ([facility])   Brief Assessment:   Cardiac Regular    Respiratory clear BS    Gastrointestinal Normal   Routine Hem:  22-Mar-13 03:48    WBC (CBC) 9.7   RBC (CBC) 2.86   Hemoglobin (CBC) 8.9   Hematocrit (CBC) 26.9   Platelet Count (CBC) 153   MCV 94   MCH 31.2   MCHC 33.1   RDW 12.8  Routine Chem:  22-Mar-13 03:48    Glucose, Serum 90   BUN 32   Creatinine (comp) 2.42   Sodium, Serum 148   Potassium, Serum 4.4   Chloride, Serum 118   CO2, Serum 11   Calcium (Total), Serum 7.0   Osmolality (calc) 301   eGFR (African American) 25   eGFR (Non-African American) 21   Anion Gap 19  Routine Hem:  22-Mar-13 03:48    Neutrophil % 76.8   Lymphocyte % 18.2   Monocyte % 4.4   Eosinophil % 0.1   Basophil % 0.5   Neutrophil # 7.4   Lymphocyte # 1.8   Monocyte # 0.4   Eosinophil # 0.0   Basophil # 0.0   Assessment/Plan:  Assessment/Plan:   Assessment Watery diarrhea. S/P flex sig. Await bx.    Plan Continue hydration. Correct any elctrolyte imbalances. VIP/gastrin/5HIAA levels once off pressors. Will follow. Thanks   Electronic Signatures: Verdie Shire (MD)  (Signed 23-Mar-13 09:27)  Authored: Chief Complaint, VITAL SIGNS/ANCILLARY NOTES, Brief Assessment, Lab Results, Assessment/Plan   Last Updated: 23-Mar-13 09:27 by Verdie Shire (MD)

## 2015-02-21 NOTE — Consult Note (Signed)
PATIENT NAME:  Misty Medina, HOERNER MR#:  [id number] DATE OF BIRTH:  09-Mar-1941  DATE OF CONSULTATION:  01/18/2012  CONSULTING PHYSICIAN:  Scot Jun, MD  HISTORY/REASON FOR CONSULTATION:  The patient is a 74 year old white female who was admitted to the hospital because of severe dehydration, volume depletion, hypotension, renal failure, and severe diarrhea. The patient says she has been having diarrhea for at least two weeks, 8 to 10 times a day. Interestingly enough, she says she is not getting up out of her sleep at night to move her bowels. She has become gradually weaker and weaker. She has severe nausea but no vomiting. After being found to be hypotensive and in renal failure, she was admitted to the hospital. The patient says she has been having chronic diarrhea for several years and has a diagnosis of irritable bowel.   PAST SURGICAL HISTORY:  1. Gallbladder removal. 2. Cholecystectomy. 3. Total abdominal hysterectomy.   ALLERGIES: Penicillin and codeine.   REVIEW OF SYSTEMS: She has progressive weakness, fatigue. Denies any chest pains. No cardiac condition except she does have a history of a heart murmur. No asthma, wheezing, emphysema, chronic cough or shortness of breath. She has nausea, no vomiting. Diarrhea without bleeding. Diffuse abdominal discomfort. No dysuria or hematuria. She does have chronic anemia. She takes iron pills. She has been on iron for a couple of years. It makes her stools dark. Denies any previous strokes. Last admission was a couple of years ago. She had hypotension and hallucinations.   FAMILY HISTORY: Positive for heart disease, negative for cancer.   MEDICATIONS:  1. Align 4 mg a day.  2. Ambien 10 mg at bedtime.  3. Aspirin 81 mg a day.  4. Bupap 50/650, 1 tablet every 6 hours as needed.  5. Carvedilol 25 mg twice a day.  6. Celebrex 200 mg a day.  7. Dexilant 60 mg a day.  8. Fluoxetine 20 mg twice a day.  9. HCTZ 12.5 mg  daily. 10. Levocetirizine 5 mg daily. 11. Levothyroxine 50 mcg daily. 12. Lisinopril 20 mg a day.  13. Lomotil p.r.n.  14. Lyrica 150 mg twice a day.   PHYSICAL EXAMINATION:  GENERAL: A white female who looks pale, fatigued, washed out.   VITAL SIGNS: Initial blood pressure on admission was 77/48. Now after IV hydration  her pressure is 107/53 with a pulse of 78, oxygen saturation 100%.   HEENT: Sclerae are anicteric. Conjunctivae are somewhat pale. Tongue is slightly pale. Head is atraumatic.   NECK: Trachea is in the midline.   CHEST: Clear.   HEART: A 1/6 systolic murmur.   ABDOMEN: Diffuse mild distention, bowel sounds present but diminished, diffuse mild tenderness to percussion and palpation.   EXTREMITIES: No edema.   SKIN: Warm and dry.   PSYCHIATRIC: Mood and affect are appropriate.   RECTAL: Exam not done at this time.  LABORATORY, DIAGNOSTIC AND RADIOLOGICAL DATA:  Glucose 110, BUN 40, creatinine 4.65, sodium 138, potassium 5, chloride 111, CO2 14, calcium 7.5, lipase 51, total protein 6.2, albumin 3, total bilirubin 0.4, alkaline phosphatase 86, SGOT 11, SGPT 10.  CPK-MB and troponins negative.  White count 7.6, hemoglobin 9.7, hematocrit 28.7, platelet count 138.  Urinalysis is unremarkable.  Portable chest x-ray shows asymmetric right lung interstitial thickening, possible interstitial pneumonitis versus atypical pulmonary edema.   ASSESSMENT: Diarrhea, going 8 to 10 times a day. Interestingly enough, she is not getting up out of her sleep at night to have diarrhea. However,  when the patient was in the hospital and ill two years ago, she had hallucinations; so it is possible that she may not be a complete historian at this time, but she seems to be alert and oriented. It is unusual to have this degree of volume depletion with diarrhea that does not produce nocturnal diarrhea.    Possible causes for her diarrhea, of course, include infectious etiologies. She was  given Cipro and Flagyl a week or two ago, took about five days of it, could not tolerate any more, so C. difficile is a possibility. It is possible she could have developed microscopic colitis such as collagenous colitis or lymphocytic colitis that could produce significant watery diarrhea. It is possible that she could have a bacterial infection, I doubt a viral infection. She has responded well to hydration.  PLAN: Do a flexible sigmoidoscopy tomorrow without prep. Do mucosal biopsies, possibly aspirate liquid stool for C. difficile or culture if not already obtained. Be sure stool samples are sent for comprehensive culture and white blood cells and C. difficile. The patient has been started on Cipro and Flagyl and was started on a Levophed drip as well due to hypotension.   I will follow with you.   ____________________________ Scot Junobert T. Jarod Bozzo, MD rte:cbb D: 01/18/2012 19:28:58 ET T: 01/18/2012 20:39:29 ET JOB#: [id number]  cc: Scot Junobert T. Carreen Milius, MD, <Dictator> Lurline DelShaukat Iftikhar, MD Silas FloodSheikh A. Ellsworth Lennoxejan-Sie, MD Scot JunOBERT T Maeola Mchaney MD ELECTRONICALLY SIGNED 01/25/2012 10:42

## 2015-02-21 NOTE — Consult Note (Signed)
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  The best treatment is Entocort (budesonide) starting at 9mg  at one dose and tapering down after a month, most all patients will require 3mg -6mg  a day to prevent relapse.  The duration of this condition is unknown. I will start this medicine now  Electronic Signatures: Scot JunElliott, Laylonie Marzec T (MD)  (Signed on 26-Mar-13 11:46)  Authored  Last Updated: 26-Mar-13 11:46 by Scot JunElliott, Aivah Putman T (MD)

## 2015-02-21 NOTE — Consult Note (Signed)
Pt had flex sig and tol well.  Mucosal lining looked a bit abnormal with loss of usual vascular pattern. Watery blackish stool seen.  She has been on iron.  Bx done to look for microscopic colitis.  Will check serum gastrin, VIP levels, 5HIAA urine, will wait til she is off levophed for these tests.  Dr. Candace Cruise will be on this weekend.  Will check met b this afternoon, and again in morning.  Electronic Signatures: Manya Silvas (MD)  (Signed on 22-Mar-13 15:16)  Authored  Last Updated: 22-Mar-13 15:16 by Manya Silvas (MD)

## 2015-03-20 IMAGING — CR DG CHEST 1V PORT
1 series · 1 of 1 positions shown · non-contrast
Comparison: 04/09/2014

CLINICAL DATA: Increasing shortness of Breath

EXAM:
PORTABLE CHEST - 1 VIEW

[ap]
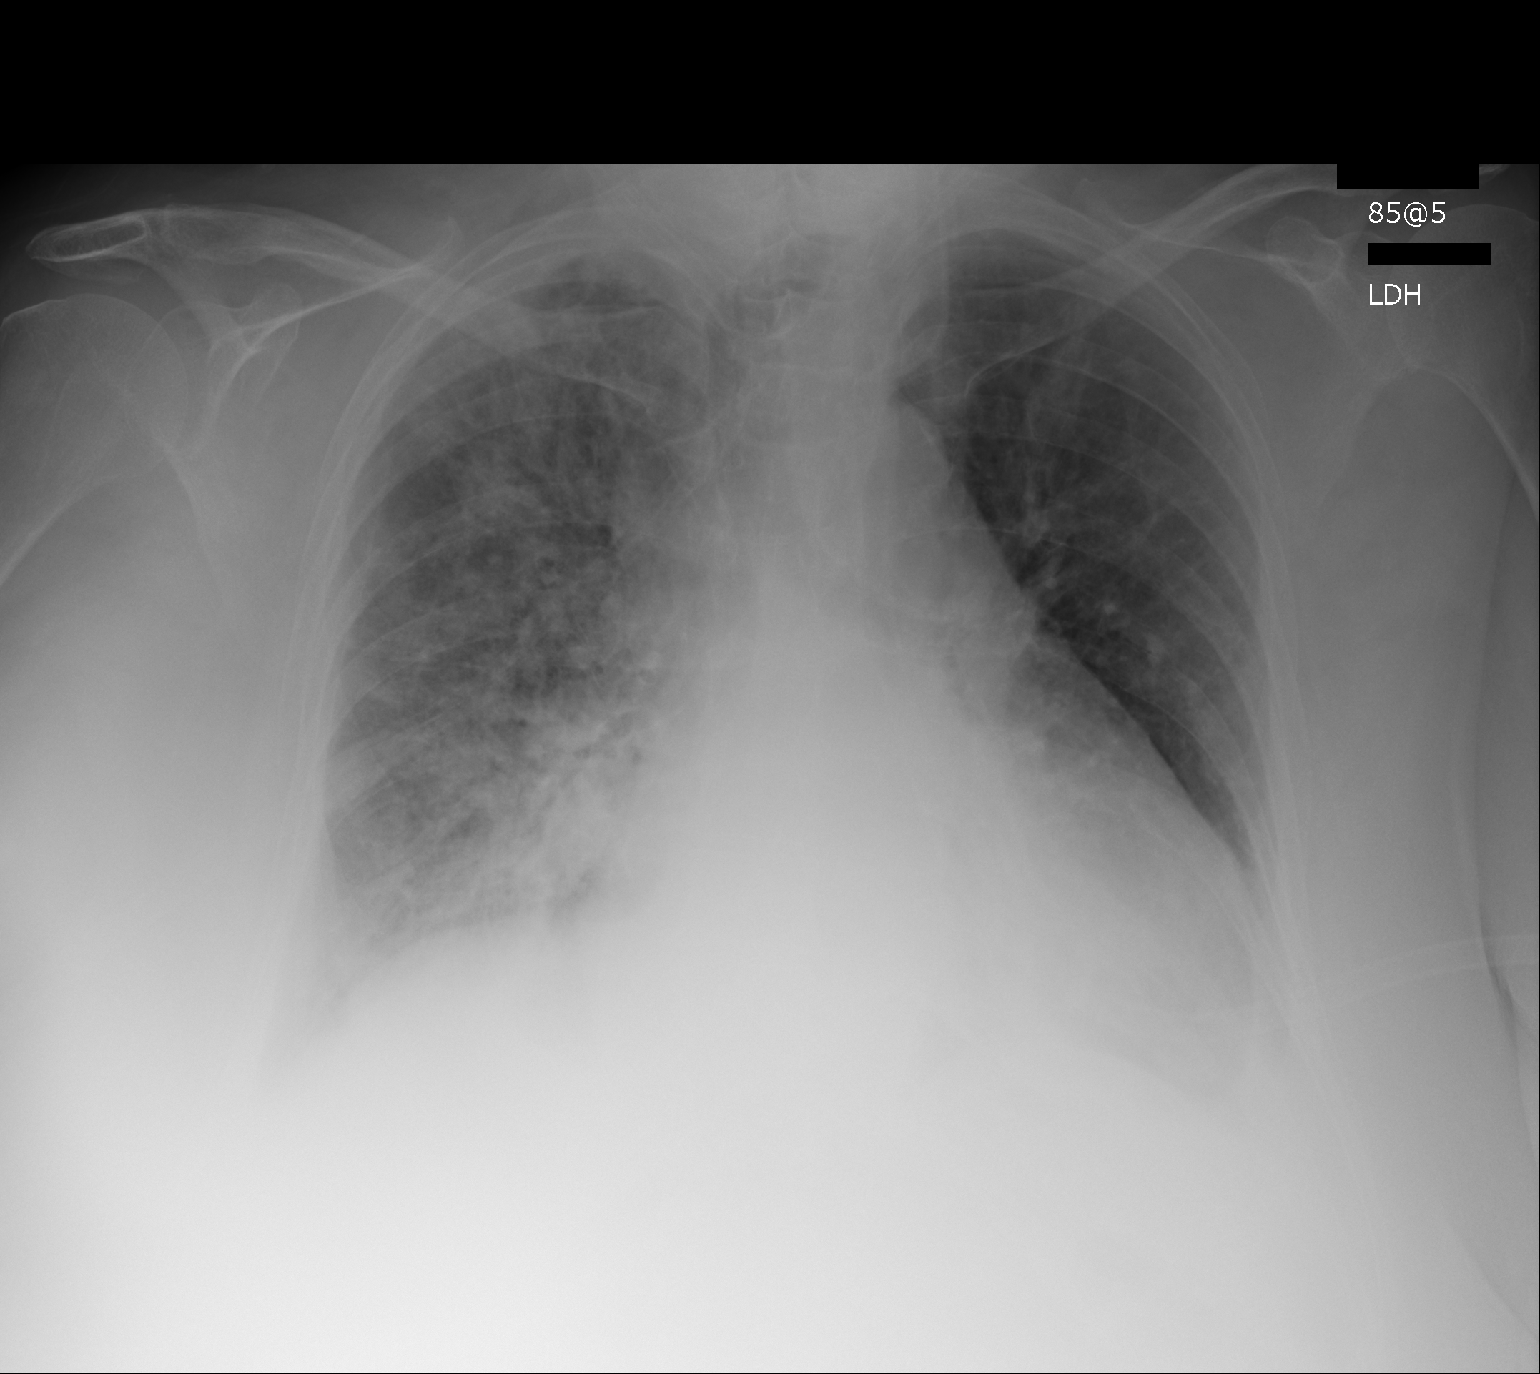

[1 of 1 positions shown; findings below may reference images not displayed]

FINDINGS: There is moderate cardiac enlargement. Small left pleural effusion
is identified. Multifocal airspace opacities within the right lung
appear new from previous exam.
IMPRESSION: 1. New right lung opacities worrisome for either asymmetric edema or
multifocal infection

## 2015-09-07 ENCOUNTER — Emergency Department: Payer: Medicare Other

## 2015-09-07 ENCOUNTER — Encounter: Payer: Self-pay | Admitting: Emergency Medicine

## 2015-09-07 ENCOUNTER — Emergency Department
Admission: EM | Admit: 2015-09-07 | Discharge: 2015-09-07 | Disposition: A | Discharge status: Home / Self Care | Payer: Medicare Other | Attending: Emergency Medicine | Admitting: Emergency Medicine

## 2015-09-07 DIAGNOSIS — S20219A Contusion of unspecified front wall of thorax, initial encounter: Secondary | ICD-10-CM | POA: Diagnosis not present

## 2015-09-07 DIAGNOSIS — Y9389 Activity, other specified: Secondary | ICD-10-CM | POA: Insufficient documentation

## 2015-09-07 DIAGNOSIS — Y9289 Other specified places as the place of occurrence of the external cause: Secondary | ICD-10-CM | POA: Diagnosis not present

## 2015-09-07 DIAGNOSIS — I10 Essential (primary) hypertension: Secondary | ICD-10-CM | POA: Diagnosis not present

## 2015-09-07 DIAGNOSIS — Y998 Other external cause status: Secondary | ICD-10-CM | POA: Diagnosis not present

## 2015-09-07 DIAGNOSIS — W01198A Fall on same level from slipping, tripping and stumbling with subsequent striking against other object, initial encounter: Secondary | ICD-10-CM | POA: Insufficient documentation

## 2015-09-07 DIAGNOSIS — S299XXA Unspecified injury of thorax, initial encounter: Secondary | ICD-10-CM | POA: Diagnosis present

## 2015-09-07 HISTORY — DX: Fibromyalgia: M79.7

## 2015-09-07 HISTORY — DX: Cardiac murmur, unspecified: R01.1

## 2015-09-07 HISTORY — DX: Essential (primary) hypertension: I10

## 2015-09-07 LAB — COMPREHENSIVE METABOLIC PANEL
ALBUMIN: 3.1 g/dL — AB (ref 3.5–5.0)
ALK PHOS: 54 U/L (ref 38–126)
ALT: 11 U/L — AB (ref 14–54)
ANION GAP: 9 (ref 5–15)
AST: 22 U/L (ref 15–41)
BILIRUBIN TOTAL: 0.7 mg/dL (ref 0.3–1.2)
BUN: 23 mg/dL — ABNORMAL HIGH (ref 6–20)
CALCIUM: 8.9 mg/dL (ref 8.9–10.3)
CO2: 27 mmol/L (ref 22–32)
CREATININE: 1.51 mg/dL — AB (ref 0.44–1.00)
Chloride: 106 mmol/L (ref 101–111)
GFR calc Af Amer: 38 mL/min — ABNORMAL LOW (ref >60)
GFR calc non Af Amer: 33 mL/min — ABNORMAL LOW (ref >60)
GLUCOSE: 90 mg/dL (ref 65–99)
Potassium: 3.6 mmol/L (ref 3.5–5.1)
Sodium: 142 mmol/L (ref 135–145)
TOTAL PROTEIN: 5.8 g/dL — AB (ref 6.5–8.1)

## 2015-09-07 LAB — PROTIME-INR
INR: 1.13
PROTHROMBIN TIME: 14.7 s (ref 11.4–15.0)

## 2015-09-07 LAB — CBC WITH DIFFERENTIAL/PLATELET
BASOS ABS: 0.1 10*3/uL (ref 0–0.1)
BASOS PCT: 1 %
EOS ABS: 0.5 10*3/uL (ref 0–0.7)
EOS PCT: 5 %
HEMATOCRIT: 32.6 % — AB (ref 35.0–47.0)
Hemoglobin: 10.7 g/dL — ABNORMAL LOW (ref 12.0–16.0)
Lymphocytes Relative: 24 %
Lymphs Abs: 2.3 10*3/uL (ref 1.0–3.6)
MCH: 31 pg (ref 26.0–34.0)
MCHC: 32.7 g/dL (ref 32.0–36.0)
MCV: 95 fL (ref 80.0–100.0)
MONO ABS: 0.6 10*3/uL (ref 0.2–0.9)
Monocytes Relative: 7 %
NEUTROS ABS: 5.9 10*3/uL (ref 1.4–6.5)
Neutrophils Relative %: 63 %
PLATELETS: 159 10*3/uL (ref 150–440)
RBC: 3.43 MIL/uL — ABNORMAL LOW (ref 3.80–5.20)
RDW: 13.2 % (ref 11.5–14.5)
WBC: 9.4 10*3/uL (ref 3.6–11.0)

## 2015-09-07 LAB — TROPONIN I: Troponin I: <0.03 ng/mL (ref <0.031)

## 2015-09-07 MED ORDER — FENTANYL CITRATE (PF) 100 MCG/2ML IJ SOLN
50.0000 ug | Freq: Once | INTRAMUSCULAR | Status: AC
  Administered 2015-09-07: 50 ug via INTRAVENOUS
  Filled 2015-09-07: qty 2

## 2015-09-07 NOTE — ED Provider Notes (Addendum)
[facility] Emergency Department Provider Note  ____________________________________________   I have reviewed the triage vital signs and the nursing notes.   HISTORY  Chief Complaint Fall    HPI Misty Medina is a 74 y.o. female presents today complaining of bruising to her chest wall. She states she turned around yesterday to flush the toilet and she lost her footing, she fell striking her breasts across the tub. She did not hit her head she did not pass out she has had no focal numbness or weakness or hip pain she is able to ambulate no neck pain she has chronic back pain which is not different. Patient states she has a history of fibromyalgia and she takes narcotic pain medication for times a day. That has not sufficiently help the pain from the bruising in her chest and it hurts to take a deep breath. No abdominal pain. She denies being on anticoagulation medication. Patient also yelled in pain within the blood pressure cuff checked her blood pressure which is 157/84.  Past Medical History  Diagnosis Date  . Fibromyalgia   . Hypertension   . Heart murmur   . Thyroid disease     There are no active problems to display for this patient.   Past Surgical History  Procedure Laterality Date  . Cholecystectomy    . Abdominal hysterectomy      No current outpatient prescriptions on file.  Allergies Codeine  No family history on file.  Social History Social History  Substance Use Topics  . Smoking status: Never Smoker   . Smokeless tobacco: Never Used  . Alcohol Use: No    Review of Systems Constitutional: No fever/chills Eyes: No visual changes. ENT: No sore throat. No stiff neck no neck pain Cardiovascular: See history of present illness regarding chest pain. Respiratory: Denies shortness of breath. Gastrointestinal:   no vomiting.  No diarrhea.  No constipation. Genitourinary: Negative for dysuria. Musculoskeletal: Negative lower  extremity swelling Skin: Negative for rash. Neurological: Negative for headaches, focal weakness or numbness. 10-point ROS otherwise negative.  ____________________________________________   PHYSICAL EXAM:  VITAL SIGNS: ED Triage Vitals  Enc Vitals Group     BP 09/07/15 1223 157/84 mmHg     Pulse Rate 09/07/15 1223 87     Resp 09/07/15 1223 20     Temp 09/07/15 1223 98.4 F (36.9 C)     Temp src --      SpO2 09/07/15 1223 95 %     Weight 09/07/15 1223 173 lb (78.472 kg)     Height 09/07/15 1223  (1.626 m)     Head Cir --      Peak Flow --      Pain Score 09/07/15 1225 8     Pain Loc --      Pain Edu? --      Excl. in GC? --     Constitutional: Alert and oriented. Well appearing and in no acute distress. Eyes: Conjunctivae are normal. PERRL. EOMI. Head: Atraumatic. Nose: No congestion/rhinnorhea. Mouth/Throat: Mucous membranes are moist.  Oropharynx non-erythematous. Neck: No stridor.   Nontender with no meningismus Cardiovascular: Normal rate, regular rhythm. Grossly normal heart sounds.  Good peripheral circulation. Chest: There is diffuse bruising across both breasts, female nurse chaperone present for exam, there is no crepitus or fell chest obviously noted, no abdominal pain. Respiratory: Normal respiratory effort.  No retractions. Lungs CTAB. Abdominal: Soft and nontender. No distention. No guarding no rebound Back:  There  is no focal tenderness or step off there is no midline tenderness there are no lesions noted. there is no CVA tenderness Musculoskeletal: No lower extremity tenderness. No joint effusions, no DVT signs strong distal pulses no edema Neurologic:  Normal speech and language. No gross focal neurologic deficits are appreciated.  Skin:  Skin is warm, dry and intact. Positive bruising to chest wall noted Psychiatric: Mood and affect are normal. Speech and behavior are normal.  ____________________________________________   LABS (all labs ordered  are listed, but only abnormal results are displayed)  Labs Reviewed  PROTIME-INR  TROPONIN I  COMPREHENSIVE METABOLIC PANEL  CBC WITH DIFFERENTIAL/PLATELET   ____________________________________________  EKG  I personally interpreted any EKGs ordered by me or triage No STEMI normal sinus rhythm rate 83 bpm no acute ST elevation or acute ST depression nonspecific ST changes noted normal axis ____________________________________________  RADIOLOGY  I reviewed any imaging ordered by me or triage that were performed during my shift ____________________________________________   PROCEDURES  Procedure(s) performed: None  Critical Care performed: None  ____________________________________________   INITIAL IMPRESSION / ASSESSMENT AND PLAN / ED COURSE  Pertinent labs & imaging results that were available during my care of the patient were reviewed by me and considered in my medical decision making (see chart for details).  Patient status post fall yesterday with no evidence of hip fracture or neck fracture or closed head injury, patient has bruising and chest wall pain which she states is making it difficult for her to breathe. She is already on home narcotics. We'll give her pain medication here K chest x-ray and although I have low suspicion this represents ACS or other pathology we will check to be sure nothing else associated in there. In addition, the patient had a nonstick will follow do not think she requires a large sink of the workup. This is a question of bruising and pain control so far as can be determined at this time. ____________________________________________  ----------------------------------------- 2:40 PM on 09/07/2015 -----------------------------------------  Chest x-ray and blood work reassuring, patient ambulatory with no difficulty here. Does use a walker. No evidence of shortness of breath. We'll give her incentive spirometry. I did discuss admission to  the hospital but she very much would prefer to go home which I do not think is unreasonable. She already has adequate pain medication at home. I told her she can take 1 extra hydrocodone if needed. Patient will be with family, they agree with discharge. She will follow-up as an outpatient and return to the emergency room if she feels worse in any way  FINAL CLINICAL IMPRESSION(S) / ED DIAGNOSES  Final diagnoses:  None     Jeanmarie PlantJames A McShane, MD 09/07/15 1247  Jeanmarie PlantJames A McShane, MD 09/07/15 1441  Jeanmarie PlantJames A McShane, MD 09/07/15 [phone]

## 2015-09-07 NOTE — Discharge Instructions (Signed)
Blunt Chest Trauma °Blunt chest trauma is an injury caused by a blow to the chest. These chest injuries can be very painful. Blunt chest trauma often results in bruised or broken (fractured) ribs. Most cases of bruised and fractured ribs from blunt chest traumas get better after 1 to 3 weeks of rest and pain medicine. Often, the soft tissue in the chest wall is also injured, causing pain and bruising. Internal organs, such as the heart and lungs, may also be injured. Blunt chest trauma can lead to serious medical problems. This injury requires immediate medical care. °CAUSES  °· Motor vehicle collisions. °· Falls. °· Physical violence. °· Sports injuries. °SYMPTOMS  °· Chest pain. The pain may be worse when you move or breathe deeply. °· Shortness of breath. °· Lightheadedness. °· Bruising. °· Tenderness. °· Swelling. °DIAGNOSIS  °Your caregiver will do a physical exam. X-rays may be taken to look for fractures. However, minor rib fractures may not show up on X-rays until a few days after the injury. If a more serious injury is suspected, further imaging tests may be done. This may include ultrasounds, computed tomography (CT) scans, or magnetic resonance imaging (MRI). °TREATMENT  °Treatment depends on the severity of your injury. Your caregiver may prescribe pain medicines and deep breathing exercises. °HOME CARE INSTRUCTIONS °· Limit your activities until you can move around without much pain. °· Do not do any strenuous work until your injury is healed. °· Put ice on the injured area. °¨ Put ice in a plastic bag. °¨ Place a towel between your skin and the bag. °¨ Leave the ice on for 15-20 minutes, 03-04 times a day. °· You may wear a rib belt as directed by your caregiver to reduce pain. °· Practice deep breathing as directed by your caregiver to keep your lungs clear. °· Only take over-the-counter or prescription medicines for pain, fever, or discomfort as directed by your caregiver. °SEEK IMMEDIATE MEDICAL  CARE IF:  °· You have increasing pain or shortness of breath. °· You cough up blood. °· You have nausea, vomiting, or abdominal pain. °· You have a fever. °· You feel dizzy, weak, or you faint. °MAKE SURE YOU: °· Understand these instructions. °· Will watch your condition. °· Will get help right away if you are not doing well or get worse. °  °This information is not intended to replace advice given to you by your health care provider. Make sure you discuss any questions you have with your health care provider. °  °Document Released: 11/23/2004 Document Revised: 11/06/2014 Document Reviewed: 04/14/2015 °Elsevier Interactive Patient Education ©2016 [employer] ° °

## 2015-09-07 NOTE — ED Notes (Signed)
Pt instructed on use of incentive spirometer. Pt verbalized understanding and returned demonstration.

## 2015-09-07 NOTE — ED Notes (Signed)
Pt presents via EMS. Pt states fell in shower last night and states it hurts to breathe. EMS reports 95% RA, 180/90.

## 2015-09-10 ENCOUNTER — Encounter: Payer: Self-pay | Admitting: *Deleted

## 2015-09-10 ENCOUNTER — Emergency Department
Admission: EM | Admit: 2015-09-10 | Discharge: 2015-09-10 | Disposition: A | Discharge status: Home / Self Care | Payer: Medicare Other | Attending: Emergency Medicine | Admitting: Emergency Medicine

## 2015-09-10 ENCOUNTER — Emergency Department: Payer: Medicare Other

## 2015-09-10 DIAGNOSIS — W1839XA Other fall on same level, initial encounter: Secondary | ICD-10-CM | POA: Insufficient documentation

## 2015-09-10 DIAGNOSIS — S20219D Contusion of unspecified front wall of thorax, subsequent encounter: Secondary | ICD-10-CM | POA: Insufficient documentation

## 2015-09-10 DIAGNOSIS — S9032XA Contusion of left foot, initial encounter: Secondary | ICD-10-CM | POA: Diagnosis not present

## 2015-09-10 DIAGNOSIS — Y9389 Activity, other specified: Secondary | ICD-10-CM | POA: Insufficient documentation

## 2015-09-10 DIAGNOSIS — Y9289 Other specified places as the place of occurrence of the external cause: Secondary | ICD-10-CM | POA: Diagnosis not present

## 2015-09-10 DIAGNOSIS — Y998 Other external cause status: Secondary | ICD-10-CM | POA: Diagnosis not present

## 2015-09-10 DIAGNOSIS — S90822A Blister (nonthermal), left foot, initial encounter: Secondary | ICD-10-CM | POA: Diagnosis not present

## 2015-09-10 DIAGNOSIS — I10 Essential (primary) hypertension: Secondary | ICD-10-CM | POA: Insufficient documentation

## 2015-09-10 DIAGNOSIS — T148XXA Other injury of unspecified body region, initial encounter: Secondary | ICD-10-CM

## 2015-09-10 DIAGNOSIS — S92912A Unspecified fracture of left toe(s), initial encounter for closed fracture: Secondary | ICD-10-CM

## 2015-09-10 DIAGNOSIS — S99922A Unspecified injury of left foot, initial encounter: Secondary | ICD-10-CM | POA: Diagnosis present

## 2015-09-10 DIAGNOSIS — S92512A Displaced fracture of proximal phalanx of left lesser toe(s), initial encounter for closed fracture: Secondary | ICD-10-CM | POA: Diagnosis not present

## 2015-09-10 LAB — CBC WITH DIFFERENTIAL/PLATELET
BASOS ABS: 0.1 10*3/uL (ref 0–0.1)
BASOS PCT: 1 %
Eosinophils Absolute: 0.8 10*3/uL — ABNORMAL HIGH (ref 0–0.7)
Eosinophils Relative: 9 %
HEMATOCRIT: 31.8 % — AB (ref 35.0–47.0)
Hemoglobin: 10.5 g/dL — ABNORMAL LOW (ref 12.0–16.0)
Lymphocytes Relative: 18 %
Lymphs Abs: 1.6 10*3/uL (ref 1.0–3.6)
MCH: 31.6 pg (ref 26.0–34.0)
MCHC: 33 g/dL (ref 32.0–36.0)
MCV: 95.8 fL (ref 80.0–100.0)
MONO ABS: 0.4 10*3/uL (ref 0.2–0.9)
Monocytes Relative: 5 %
NEUTROS ABS: 6.1 10*3/uL (ref 1.4–6.5)
NEUTROS PCT: 67 %
PLATELETS: 180 10*3/uL (ref 150–440)
RBC: 3.32 MIL/uL — ABNORMAL LOW (ref 3.80–5.20)
RDW: 13.8 % (ref 11.5–14.5)
WBC: 9.1 10*3/uL (ref 3.6–11.0)

## 2015-09-10 LAB — BASIC METABOLIC PANEL
ANION GAP: 6 (ref 5–15)
BUN: 25 mg/dL — ABNORMAL HIGH (ref 6–20)
CALCIUM: 7.8 mg/dL — AB (ref 8.9–10.3)
CO2: 25 mmol/L (ref 22–32)
Chloride: 110 mmol/L (ref 101–111)
Creatinine, Ser: 2.42 mg/dL — ABNORMAL HIGH (ref 0.44–1.00)
GFR, EST AFRICAN AMERICAN: 22 mL/min — AB (ref >60)
GFR, EST NON AFRICAN AMERICAN: 19 mL/min — AB (ref >60)
GLUCOSE: 122 mg/dL — AB (ref 65–99)
Potassium: 3.8 mmol/L (ref 3.5–5.1)
Sodium: 141 mmol/L (ref 135–145)

## 2015-09-10 MED ORDER — HYDROMORPHONE HCL 2 MG PO TABS
2.0000 mg | ORAL_TABLET | Freq: Once | ORAL | Status: AC
  Administered 2015-09-10: 2 mg via ORAL
  Filled 2015-09-10: qty 1

## 2015-09-10 MED ORDER — HYDROMORPHONE HCL 2 MG PO TABS: 2.0000 mg | ORAL_TABLET | Freq: Two times a day (BID) | ORAL | Status: DC | PRN

## 2015-09-10 NOTE — Discharge Instructions (Signed)
Cast or Splint Care °Casts and splints support injured limbs and keep bones from moving while they heal. It is important to care for your cast or splint at home.   °HOME CARE INSTRUCTIONS °· Keep the cast or splint uncovered during the drying period. It can take 24 to 48 hours to dry if it is made of plaster. A fiberglass cast will dry in less than 1 hour. °· Do not rest the cast on anything harder than a pillow for the first 24 hours. °· Do not put weight on your injured limb or apply pressure to the cast until your health care provider gives you permission. °· Keep the cast or splint dry. Wet casts or splints can lose their shape and may not support the limb as well. A wet cast that has lost its shape can also create harmful pressure on your skin when it dries. Also, wet skin can become infected. °· Cover the cast or splint with a plastic bag when bathing or when out in the rain or snow. If the cast is on the trunk of the body, take sponge baths until the cast is removed. °· If your cast does become wet, dry it with a towel or a blow dryer on the cool setting only. °· Keep your cast or splint clean. Soiled casts may be wiped with a moistened cloth. °· Do not place any hard or soft foreign objects under your cast or splint, such as cotton, toilet paper, lotion, or powder. °· Do not try to scratch the skin under the cast with any object. The object could get stuck inside the cast. Also, scratching could lead to an infection. If itching is a problem, use a blow dryer on a cool setting to relieve discomfort. °· Do not trim or cut your cast or remove padding from inside of it. °· Exercise all joints next to the injury that are not immobilized by the cast or splint. For example, if you have a long leg cast, exercise the hip joint and toes. If you have an arm cast or splint, exercise the shoulder, elbow, thumb, and fingers. °· Elevate your injured arm or leg on 1 or 2 pillows for the first 1 to 3 days to decrease  swelling and pain. It is best if you can comfortably elevate your cast so it is higher than your heart. °SEEK MEDICAL CARE IF:  °· Your cast or splint cracks. °· Your cast or splint is too tight or too loose. °· You have unbearable itching inside the cast. °· Your cast becomes wet or develops a soft spot or area. °· You have a bad smell coming from inside your cast. °· You get an object stuck under your cast. °· Your skin around the cast becomes red or raw. °· You have new pain or worsening pain after the cast has been applied. °SEEK IMMEDIATE MEDICAL CARE IF:  °· You have fluid leaking through the cast. °· You are unable to move your fingers or toes. °· You have discolored (blue or white), cool, painful, or very swollen fingers or toes beyond the cast. °· You have tingling or numbness around the injured area. °· You have severe pain or pressure under the cast. °· You have any difficulty with your breathing or have shortness of breath. °· You have chest pain. °  °This information is not intended to replace advice given to you by your health care provider. Make sure you discuss any questions you have with your health care   provider.   Document Released: 10/13/2000 Document Revised: 08/06/2013 Document Reviewed: 04/24/2013 Elsevier Interactive Patient Education 2016 [employer]  Toe Fracture A toe fracture is a break in one of the toe bones (phalanges). CAUSES This condition may be caused by:  Dropping a heavy object on your toe.  Stubbing your toe.  Overusing your toe or doing repetitive exercise.  Twisting or stretching your toe out of place. RISK FACTORS This condition is more likely to develop in people who:  Play contact sports.  Have a bone disease.  Have a low calcium level. SYMPTOMS The main symptoms of this condition are swelling and pain in the toe. The pain may get worse with standing or walking. Other symptoms include:  Bruising.  Stiffness.  Numbness.  A change in the  way the toe looks.  Broken bones that poke through the skin.  Blood beneath the toenail. DIAGNOSIS This condition is diagnosed with a physical exam. You may also have X-rays. TREATMENT  Treatment for this condition depends on the type of fracture and its severity. Treatment may involve:  Taping the broken toe to a toe that is next to it (buddy taping). This is the most common treatment for fractures in which the bone has not moved out of place (nondisplaced fracture).  Wearing a shoe that has a wide, rigid sole to protect the toe and to limit its movement.  Wearing a walking cast.  Having a procedure to move the toe back into place.  Surgery. This may be needed:  If there are many pieces of broken bone that are out of place (displaced).  If the toe joint breaks.  If the bone breaks through the skin.  Physical therapy. This is done to help regain movement and strength in the toe. You may need follow-up X-rays to make sure that the bone is healing well and staying in position. HOME CARE INSTRUCTIONS If You Have a Cast:  Do not stick anything inside the cast to scratch your skin. Doing that increases your risk of infection.  Check the skin around the cast every day. Report any concerns to your health care provider. You may put lotion on dry skin around the edges of the cast. Do not apply lotion to the skin underneath the cast.  Do not put pressure on any part of the cast until it is fully hardened. This may take several hours.  Keep the cast clean and dry. Bathing  Do not take baths, swim, or use a hot tub until your health care provider approves. Ask your health care provider if you can take showers. You may only be allowed to take sponge baths for bathing.  If your health care provider approves bathing and showering, cover the cast or bandage (dressing) with a watertight plastic bag to protect it from water. Do not let the cast or dressing get wet. Managing Pain, Stiffness,  and Swelling  If you do not have a cast, apply ice to the injured area, if directed.  Put ice in a plastic bag.  Place a towel between your skin and the bag.  Leave the ice on for 20 minutes, 2-3 times per day.  Move your toes often to avoid stiffness and to lessen swelling.  Raise (elevate) the injured area above the level of your heart while you are sitting or lying down. Driving  Do not drive or operate heavy machinery while taking pain medicine.  Do not drive while wearing a cast on a foot that you  use for driving. Activity  Return to your normal activities as directed by your health care provider. Ask your health care provider what activities are safe for you.  Perform exercises daily as directed by your health care provider or physical therapist. Safety  Do not use the injured limb to support your body weight until your health care provider says that you can. Use crutches or other assistive devices as directed by your health care provider. General Instructions  If your toe was treated with buddy taping, follow your health care provider's instructions for changing the gauze and tape. Change it more often:  The gauze and tape get wet. If this happens, dry the space between the toes.  The gauze and tape are too tight and cause your toe to become pale or numb.  Wear a protective shoe as directed by your health care provider. If you were not given a protective shoe, wear sturdy, supportive shoes. Your shoes should not pinch your toes and should not fit tightly against your toes.  Do not use any tobacco products, including cigarettes, chewing tobacco, or e-cigarettes. Tobacco can delay bone healing. If you need help quitting, ask your health care provider.  Take medicines only as directed by your health care provider.  Keep all follow-up visits as directed by your health care provider. This is important. SEEK MEDICAL CARE IF:  You have a fever.  Your pain medicine is not  helping.  Your toe is cold.  Your toe is numb.  You still have pain after one week of rest and treatment.  You still have pain after your health care provider has said that you can start walking again.  You have pain, tingling, or numbness in your foot that is not going away. SEEK IMMEDIATE MEDICAL CARE IF:  You have severe pain.  You have redness or inflammation in your toe that is getting worse.  You have pain or numbness in your toe that is getting worse.  Your toe turns blue.   This information is not intended to replace advice given to you by your health care provider. Make sure you discuss any questions you have with your health care provider.   Document Released: 10/13/2000 Document Revised: 07/07/2015 Document Reviewed: 08/12/2014 Elsevier Interactive Patient Education [employer]

## 2015-09-10 NOTE — ED Notes (Signed)
Reviewed d/c instructions, follow-up care, medications an use of incentive spirometer (pt received on Tuesday's visit) with pt.  Pt verbalized understanding.

## 2015-09-10 NOTE — ED Notes (Signed)
Pt arrived to ED from home reporting increased left foot and centralized chest pain after a fall on Monday and repeat fall yesterday. Pt denies LOC or hitting head. Pt is reports SOB since fall on Monday but reports being seen and cleared in ED on Monday. Pts foot is purple and swollen with blisters noted. Pt has large amounts of bruising on center of chest and left breast. Pt reports she was able to walk earlier in the week but can no longer put weight on foot.

## 2015-09-10 NOTE — ED Provider Notes (Signed)
Time Seen: Approximately ----------------------------------------- 7:44 PM on 09/10/2015 -----------------------------------------   I have reviewed the triage notes  Chief Complaint: Fall and Chest Pain   History of Present Illness: Misty Medina is a 74 y.o. female who was seen and evaluated here 3 days ago for a non-syncopal fall with mainly chest wall trauma. Patient states she lost her balance and fell striking her breasts across the time at home. She was concerned tonight because she noted some increasing ecchymosis and swelling in her left foot. Patient states she still is been able to ambulate though still having discomfort mainly in the chest area. Patient's on chronic Norco at home. Patient denies any syncopal episode, head trauma, neck thoracic or lumbar spine pain.   Past Medical History  Diagnosis Date  . Fibromyalgia   . Hypertension   . Heart murmur   . Thyroid disease     There are no active problems to display for this patient.   Past Surgical History  Procedure Laterality Date  . Cholecystectomy    . Abdominal hysterectomy      Past Surgical History  Procedure Laterality Date  . Cholecystectomy    . Abdominal hysterectomy      No current outpatient prescriptions on file.  Allergies:  Codeine  Family History: History reviewed. No pertinent family history.  Social History: Social History  Substance Use Topics  . Smoking status: Never Smoker   . Smokeless tobacco: Never Used  . Alcohol Use: No     Review of Systems:   10 point review of systems was performed and was otherwise negative:  Constitutional: No fever Eyes: No visual disturbances ENT: No sore throat, ear pain Cardiac: No new chest pain, mainly pleuritic and noted to have increasing ecchymosis across both lower chest region Respiratory: No shortness of breath, wheezing, or stridor Abdomen: No abdominal pain, no vomiting, No diarrhea Endocrine: No weight loss, No night  sweats Extremities: No peripheral edema, cyanosis Skin: No rashes, easy bruising Neurologic: No focal weakness, trouble with speech or swollowing Urologic: No dysuria, Hematuria, or urinary frequency   Physical Exam:  ED Triage Vitals  Enc Vitals Group     BP 09/10/15 1902 119/67 mmHg     Pulse Rate 09/10/15 1902 77     Resp 09/10/15 1902 18     Temp 09/10/15 1902 97.5 F (36.4 C)     Temp Source 09/10/15 1902 Oral     SpO2 09/10/15 1902 96 %     Weight 09/10/15 1902 173 lb (78.472 kg)     Height 09/10/15 1902 5\' 6"  (1.676 m)     Head Cir --      Peak Flow --      Pain Score 09/10/15 1902 8     Pain Loc --      Pain Edu? --      Excl. in GC? --     General: Awake , Alert , and Oriented times 3; GCS 15 Head: Normal cephalic , atraumatic Eyes: Pupils equal , round, reactive to light Nose/Throat: No nasal drainage, patent upper airway without erythema or exudate.  Neck: Supple, Full range of motion, No anterior adenopathy or palpable thyroid masses Lungs: Clear to ascultation without wheezes , rhonchi, or rales Heart: Regular rate, regular rhythm without murmurs , gallops , or rubs Abdomen: Soft, non tender without rebound, guarding , or rigidity; bowel sounds positive and symmetric in all 4 quadrants. No organomegaly .        Extremities:  Examination of the left foot shows what appears to be large hematoma over the anterior surface of her left foot with some blistering which appears to be a blood blister. There is no obvious crepitus or step-off noted and the ecchymosis extends into all of her digits on the left foot. The Achilles appears to be intact and the ankle itself is stable to palpation with no obvious disruption of the mortise of her ankle  Neurologic: normal ambulation, Motor symmetric without deficits, sensory intact Skin: warm, dry, no rashes Patient has obvious developing chest wall contusion without any crepitus or step-off noted  Labs:   All laboratory work was  reviewed including any pertinent negatives or positives listed below:  Labs Reviewed  BASIC METABOLIC PANEL  CBC WITH DIFFERENTIAL/PLATELET    EKG:  ED ECG REPORT I, Jennye Moccasin, the attending physician, personally viewed and interpreted this ECG.  Date: 09/10/2015 EKG Time: 1903 Rate: *76 Rhythm: normal sinus rhythm QRS Axis: normal Intervals: normal ST/T Wave abnormalities: normal Conduction Disutrbances: none Narrative Interpretation: unremarkable No ischemic changes EXAM: CHEST 2 VIEW  COMPARISON: 09/07/2015  FINDINGS: Heart size is accentuated by the AP position of the patient and is mildly enlarged. Small bilateral pleural effusions are present. There is no pneumothorax. No pulmonary edema. Perihilar bronchitic changes are stable.  IMPRESSION: 1. Cardiomegaly. 2. Left pleural effusion.   Electronically Signed By: Norva Pavlov M.D. On: 09/10/2015 19:42          DG Foot Complete Left (Final result) Result time: 09/10/15 19:42:46   Final result by Rad Results In Interface (09/10/15 19:42:46)   Narrative:   CLINICAL DATA: Foot purple and swollen with blisters.  EXAM: LEFT FOOT - COMPLETE 3+ VIEW  COMPARISON: None.  FINDINGS: Examination demonstrates mild diffuse decreased bone mineralization. There is a subtle chip fracture along the lateral base of the fifth proximal phalanx. There is soft tissue swelling over the dorsal lateral aspect of the forefoot.  IMPRESSION: Subtle chip fracture along the lateral base of the fifth proximal phalanx.      Radiology      I personally reviewed the radiologic studies   Procedures:  Patient will have a posterior splint applied to her left foot which I felt would give her more stability. It also should offer more protection and for the blood blister located on her left foot anteriorly    ED Course:  Patient's stay was uneventful she was given by mouth Dilaudid for pain and I felt  she could be discharged and treated on an outpatient basis. I reviewed the chest x-ray reading and visualize the x-ray myself I don't see any large pleural effusion on the left and there does not appear to be any rib fractures   Assessment:  Status post fall with chest wall contusion Blood blister anterior surface left foot Left fifth toe distal phalanx fracture     Plan:  Outpatient management Patient was advised to return immediately if condition worsens. Patient was advised to follow up with her primary care physician or other specialized physicians involved and in their current assessment. Patient was referred to orthopedics unassigned. She's also been advised to contact her primary physician.            Jennye Moccasin, MD 09/10/15 2121

## 2015-09-14 ENCOUNTER — Emergency Department: Payer: Medicare Other

## 2015-09-14 ENCOUNTER — Encounter: Payer: Self-pay | Admitting: *Deleted

## 2015-09-14 ENCOUNTER — Observation Stay
Admission: EM | Admit: 2015-09-14 | Discharge: 2015-09-16 | Disposition: A | Discharge status: Skilled Nursing Facility | Payer: Medicare Other | Attending: Internal Medicine | Admitting: Internal Medicine

## 2015-09-14 DIAGNOSIS — K5900 Constipation, unspecified: Secondary | ICD-10-CM

## 2015-09-14 DIAGNOSIS — I10 Essential (primary) hypertension: Secondary | ICD-10-CM | POA: Diagnosis present

## 2015-09-14 DIAGNOSIS — S92502A Displaced unspecified fracture of left lesser toe(s), initial encounter for closed fracture: Secondary | ICD-10-CM

## 2015-09-14 DIAGNOSIS — Z885 Allergy status to narcotic agent status: Secondary | ICD-10-CM | POA: Diagnosis not present

## 2015-09-14 DIAGNOSIS — Z880 Allergy status to penicillin: Secondary | ICD-10-CM | POA: Insufficient documentation

## 2015-09-14 DIAGNOSIS — R296 Repeated falls: Secondary | ICD-10-CM | POA: Insufficient documentation

## 2015-09-14 DIAGNOSIS — E039 Hypothyroidism, unspecified: Secondary | ICD-10-CM | POA: Diagnosis not present

## 2015-09-14 DIAGNOSIS — R238 Other skin changes: Secondary | ICD-10-CM

## 2015-09-14 DIAGNOSIS — S90822A Blister (nonthermal), left foot, initial encounter: Secondary | ICD-10-CM | POA: Insufficient documentation

## 2015-09-14 DIAGNOSIS — Z8262 Family history of osteoporosis: Secondary | ICD-10-CM | POA: Diagnosis not present

## 2015-09-14 DIAGNOSIS — Z8261 Family history of arthritis: Secondary | ICD-10-CM | POA: Insufficient documentation

## 2015-09-14 DIAGNOSIS — S92902A Unspecified fracture of left foot, initial encounter for closed fracture: Secondary | ICD-10-CM | POA: Diagnosis present

## 2015-09-14 DIAGNOSIS — Z9049 Acquired absence of other specified parts of digestive tract: Secondary | ICD-10-CM | POA: Diagnosis not present

## 2015-09-14 DIAGNOSIS — K219 Gastro-esophageal reflux disease without esophagitis: Secondary | ICD-10-CM | POA: Diagnosis present

## 2015-09-14 DIAGNOSIS — X58XXXA Exposure to other specified factors, initial encounter: Secondary | ICD-10-CM | POA: Insufficient documentation

## 2015-09-14 DIAGNOSIS — I130 Hypertensive heart and chronic kidney disease with heart failure and stage 1 through stage 4 chronic kidney disease, or unspecified chronic kidney disease: Secondary | ICD-10-CM | POA: Diagnosis not present

## 2015-09-14 DIAGNOSIS — F329 Major depressive disorder, single episode, unspecified: Secondary | ICD-10-CM | POA: Insufficient documentation

## 2015-09-14 DIAGNOSIS — I509 Heart failure, unspecified: Secondary | ICD-10-CM | POA: Diagnosis not present

## 2015-09-14 DIAGNOSIS — S90829A Blister (nonthermal), unspecified foot, initial encounter: Secondary | ICD-10-CM

## 2015-09-14 DIAGNOSIS — M810 Age-related osteoporosis without current pathological fracture: Secondary | ICD-10-CM | POA: Insufficient documentation

## 2015-09-14 DIAGNOSIS — N189 Chronic kidney disease, unspecified: Secondary | ICD-10-CM | POA: Insufficient documentation

## 2015-09-14 DIAGNOSIS — Z79899 Other long term (current) drug therapy: Secondary | ICD-10-CM | POA: Insufficient documentation

## 2015-09-14 DIAGNOSIS — M797 Fibromyalgia: Secondary | ICD-10-CM | POA: Diagnosis not present

## 2015-09-14 DIAGNOSIS — Z8249 Family history of ischemic heart disease and other diseases of the circulatory system: Secondary | ICD-10-CM | POA: Diagnosis not present

## 2015-09-14 DIAGNOSIS — Z79891 Long term (current) use of opiate analgesic: Secondary | ICD-10-CM | POA: Insufficient documentation

## 2015-09-14 DIAGNOSIS — N179 Acute kidney failure, unspecified: Secondary | ICD-10-CM | POA: Diagnosis not present

## 2015-09-14 DIAGNOSIS — L988 Other specified disorders of the skin and subcutaneous tissue: Secondary | ICD-10-CM | POA: Diagnosis present

## 2015-09-14 DIAGNOSIS — S92515A Nondisplaced fracture of proximal phalanx of left lesser toe(s), initial encounter for closed fracture: Secondary | ICD-10-CM | POA: Diagnosis not present

## 2015-09-14 DIAGNOSIS — M25475 Effusion, left foot: Secondary | ICD-10-CM

## 2015-09-14 DIAGNOSIS — W19XXXA Unspecified fall, initial encounter: Secondary | ICD-10-CM | POA: Diagnosis not present

## 2015-09-14 DIAGNOSIS — R0602 Shortness of breath: Secondary | ICD-10-CM | POA: Insufficient documentation

## 2015-09-14 DIAGNOSIS — R52 Pain, unspecified: Principal | ICD-10-CM | POA: Insufficient documentation

## 2015-09-14 DIAGNOSIS — Z90710 Acquired absence of both cervix and uterus: Secondary | ICD-10-CM | POA: Insufficient documentation

## 2015-09-14 DIAGNOSIS — F333 Major depressive disorder, recurrent, severe with psychotic symptoms: Secondary | ICD-10-CM | POA: Diagnosis present

## 2015-09-14 DIAGNOSIS — I5022 Chronic systolic (congestive) heart failure: Secondary | ICD-10-CM | POA: Diagnosis present

## 2015-09-14 HISTORY — DX: Depression, unspecified: F32.A

## 2015-09-14 HISTORY — DX: Age-related osteoporosis without current pathological fracture: M81.0

## 2015-09-14 HISTORY — DX: Major depressive disorder, single episode, unspecified: F32.9

## 2015-09-14 HISTORY — DX: Hypothyroidism, unspecified: E03.9

## 2015-09-14 HISTORY — DX: Gastro-esophageal reflux disease without esophagitis: K21.9

## 2015-09-14 LAB — CBC WITH DIFFERENTIAL/PLATELET
BASOS ABS: 0.1 10*3/uL (ref 0–0.1)
BASOS PCT: 1 %
EOS ABS: 0.7 10*3/uL (ref 0–0.7)
EOS PCT: 6 %
HCT: 31.5 % — ABNORMAL LOW (ref 35.0–47.0)
Hemoglobin: 10.4 g/dL — ABNORMAL LOW (ref 12.0–16.0)
Lymphocytes Relative: 21 %
Lymphs Abs: 2.4 10*3/uL (ref 1.0–3.6)
MCH: 31.1 pg (ref 26.0–34.0)
MCHC: 33.1 g/dL (ref 32.0–36.0)
MCV: 94 fL (ref 80.0–100.0)
MONO ABS: 0.7 10*3/uL (ref 0.2–0.9)
Monocytes Relative: 6 %
Neutro Abs: 7.3 10*3/uL — ABNORMAL HIGH (ref 1.4–6.5)
Neutrophils Relative %: 66 %
PLATELETS: 238 10*3/uL (ref 150–440)
RBC: 3.35 MIL/uL — ABNORMAL LOW (ref 3.80–5.20)
RDW: 13.3 % (ref 11.5–14.5)
WBC: 11.1 10*3/uL — AB (ref 3.6–11.0)

## 2015-09-14 LAB — COMPREHENSIVE METABOLIC PANEL
ALBUMIN: 2.8 g/dL — AB (ref 3.5–5.0)
ALT: 17 U/L (ref 14–54)
ANION GAP: 9 (ref 5–15)
AST: 40 U/L (ref 15–41)
Alkaline Phosphatase: 76 U/L (ref 38–126)
BILIRUBIN TOTAL: 1.3 mg/dL — AB (ref 0.3–1.2)
BUN: 34 mg/dL — ABNORMAL HIGH (ref 6–20)
CHLORIDE: 106 mmol/L (ref 101–111)
CO2: 23 mmol/L (ref 22–32)
Calcium: 8 mg/dL — ABNORMAL LOW (ref 8.9–10.3)
Creatinine, Ser: 1.58 mg/dL — ABNORMAL HIGH (ref 0.44–1.00)
GFR calc Af Amer: 36 mL/min — ABNORMAL LOW (ref >60)
GFR, EST NON AFRICAN AMERICAN: 31 mL/min — AB (ref >60)
Glucose, Bld: 97 mg/dL (ref 65–99)
POTASSIUM: 3.7 mmol/L (ref 3.5–5.1)
SODIUM: 138 mmol/L (ref 135–145)
TOTAL PROTEIN: 6 g/dL — AB (ref 6.5–8.1)

## 2015-09-14 LAB — CK: CK TOTAL: 858 U/L — AB (ref 38–234)

## 2015-09-14 LAB — TROPONIN I

## 2015-09-14 MED ORDER — HYDROMORPHONE HCL 1 MG/ML IJ SOLN
0.5000 mg | Freq: Once | INTRAMUSCULAR | Status: AC
  Administered 2015-09-14: 0.5 mg via INTRAVENOUS
  Filled 2015-09-14: qty 1

## 2015-09-14 MED ORDER — MORPHINE SULFATE (PF) 4 MG/ML IV SOLN
4.0000 mg | Freq: Once | INTRAVENOUS | Status: AC
  Administered 2015-09-14: 4 mg via INTRAVENOUS
  Filled 2015-09-14: qty 1

## 2015-09-14 MED ORDER — ACETAMINOPHEN 650 MG RE SUPP
650.0000 mg | Freq: Four times a day (QID) | RECTAL | Status: DC | PRN
Start: 2015-09-14 — End: 2015-09-16

## 2015-09-14 MED ORDER — ONDANSETRON HCL 4 MG/2ML IJ SOLN: 4.0000 mg | Freq: Four times a day (QID) | INTRAMUSCULAR | Status: DC | PRN

## 2015-09-14 MED ORDER — BACITRACIN ZINC 500 UNIT/GM EX OINT
TOPICAL_OINTMENT | CUTANEOUS | Status: AC
  Administered 2015-09-14: 18:00:00
  Filled 2015-09-14: qty 1.8

## 2015-09-14 MED ORDER — ONDANSETRON HCL 4 MG/2ML IJ SOLN
4.0000 mg | Freq: Once | INTRAMUSCULAR | Status: AC
  Administered 2015-09-14: 4 mg via INTRAVENOUS
  Filled 2015-09-14: qty 2

## 2015-09-14 MED ORDER — SODIUM CHLORIDE 0.9 % IV BOLUS (SEPSIS)
1000.0000 mL | Freq: Once | INTRAVENOUS | Status: AC
  Administered 2015-09-14: 1000 mL via INTRAVENOUS

## 2015-09-14 MED ORDER — LEVOTHYROXINE SODIUM 75 MCG PO TABS
75.0000 ug | ORAL_TABLET | Freq: Every day | ORAL | Status: DC
  Administered 2015-09-15: 75 ug via ORAL
  Filled 2015-09-14 (×3): qty 1

## 2015-09-14 MED ORDER — IPRATROPIUM-ALBUTEROL 0.5-2.5 (3) MG/3ML IN SOLN
9.0000 mL | Freq: Once | RESPIRATORY_TRACT | Status: AC
  Administered 2015-09-14: 9 mL via RESPIRATORY_TRACT
  Filled 2015-09-14: qty 9

## 2015-09-14 MED ORDER — PANTOPRAZOLE SODIUM 40 MG PO TBEC
40.0000 mg | DELAYED_RELEASE_TABLET | Freq: Every day | ORAL | Status: DC
  Administered 2015-09-15 – 2015-09-16 (×2): 40 mg via ORAL
  Filled 2015-09-14 (×2): qty 1

## 2015-09-14 MED ORDER — CARVEDILOL 25 MG PO TABS
25.0000 mg | ORAL_TABLET | Freq: Two times a day (BID) | ORAL | Status: DC
  Administered 2015-09-14 – 2015-09-16 (×4): 25 mg via ORAL
  Filled 2015-09-14 (×4): qty 1

## 2015-09-14 MED ORDER — BUSPIRONE HCL 5 MG PO TABS
15.0000 mg | ORAL_TABLET | Freq: Two times a day (BID) | ORAL | Status: DC
  Administered 2015-09-14 – 2015-09-16 (×4): 15 mg via ORAL
  Filled 2015-09-14 (×4): qty 3

## 2015-09-14 MED ORDER — ZOLPIDEM TARTRATE 5 MG PO TABS
5.0000 mg | ORAL_TABLET | Freq: Every evening | ORAL | Status: DC | PRN
  Administered 2015-09-14 – 2015-09-15 (×2): 5 mg via ORAL
  Filled 2015-09-14 (×2): qty 1

## 2015-09-14 MED ORDER — ONDANSETRON HCL 4 MG PO TABS: 4.0000 mg | ORAL_TABLET | Freq: Four times a day (QID) | ORAL | Status: DC | PRN

## 2015-09-14 MED ORDER — HEPARIN SODIUM (PORCINE) 5000 UNIT/ML IJ SOLN
5000.0000 [IU] | Freq: Three times a day (TID) | INTRAMUSCULAR | Status: DC
  Administered 2015-09-14 – 2015-09-15 (×3): 5000 [IU] via SUBCUTANEOUS
  Filled 2015-09-14 (×3): qty 1

## 2015-09-14 MED ORDER — HYDROMORPHONE HCL 2 MG PO TABS
2.0000 mg | ORAL_TABLET | Freq: Two times a day (BID) | ORAL | Status: DC | PRN
  Administered 2015-09-15: 2 mg via ORAL
  Filled 2015-09-14: qty 1

## 2015-09-14 MED ORDER — ACETAMINOPHEN 325 MG PO TABS
650.0000 mg | ORAL_TABLET | Freq: Four times a day (QID) | ORAL | Status: DC | PRN
  Filled 2015-09-14: qty 2

## 2015-09-14 MED ORDER — CITALOPRAM HYDROBROMIDE 20 MG PO TABS
40.0000 mg | ORAL_TABLET | Freq: Every day | ORAL | Status: DC
  Administered 2015-09-15 – 2015-09-16 (×2): 40 mg via ORAL
  Filled 2015-09-14 (×3): qty 2

## 2015-09-14 MED ORDER — METHYLPREDNISOLONE SODIUM SUCC 125 MG IJ SOLR
125.0000 mg | Freq: Once | INTRAMUSCULAR | Status: AC
  Administered 2015-09-14: 125 mg via INTRAVENOUS
  Filled 2015-09-14: qty 2

## 2015-09-14 MED ORDER — SIMVASTATIN 20 MG PO TABS
20.0000 mg | ORAL_TABLET | Freq: Every day | ORAL | Status: DC
  Administered 2015-09-14 – 2015-09-15 (×2): 20 mg via ORAL
  Filled 2015-09-14 (×2): qty 1

## 2015-09-14 MED ORDER — PREGABALIN 25 MG PO CAPS
25.0000 mg | ORAL_CAPSULE | Freq: Two times a day (BID) | ORAL | Status: DC
  Administered 2015-09-15 – 2015-09-16 (×3): 25 mg via ORAL
  Filled 2015-09-14 (×7): qty 1

## 2015-09-14 MED ORDER — HYDROCODONE-ACETAMINOPHEN 10-325 MG PO TABS
1.0000 | ORAL_TABLET | Freq: Four times a day (QID) | ORAL | Status: DC | PRN
  Administered 2015-09-15 (×3): 1 via ORAL
  Filled 2015-09-14 (×3): qty 1

## 2015-09-14 NOTE — Progress Notes (Signed)
CSW was contacted by Home health agency [facility] to report that Pt just opened to them after being ordered by her PCP. They were attempting to meet with her when PCP reported that she was sent to ED.   Wilford Gristara Storm Dulski, LCSW  [phone]

## 2015-09-14 NOTE — H&P (Signed)
[facility] [place] [facility]   PATIENT NAME: Misty SloopBrenda Medina    MR#:  [id number]  DATE OF BIRTH:  05/30/1941  DATE OF ADMISSION:  09/14/2015  PRIMARY CARE PHYSICIAN: Luna FuseEJAN-SIE, SHEIKH AHMED, MD   REQUESTING/REFERRING PHYSICIAN: Pershing ProudSchaevitz, MD  CHIEF COMPLAINT:   Chief Complaint  Patient presents with  . Sore    HISTORY OF PRESENT ILLNESS:  Misty SloopBrenda Medina  is a 74 y.o. female who presents with multiple falls at home, shortness of breath, as well as a significant bolus blister on her left foot. Patient has multiple problems. She was sent here today from her primary care physician's office. She recently fractured her left foot, and has been having frequent falls at home since that time. Her foot was splinted and wrapped, and on evaluation today she was noted to have a large bullous blister on the dorsal aspect of that foot. This blister has been getting significantly larger per her report. She also had an episode of significant shortness of breath with some wheezing today. Her BNP is elevated at almost 900, and her chest x-ray does not look like terrible heart failure, though there is some cephalization of her vessels denoting some likely vascular congestion. Patient was unable family to the ED, and required multiple doses of pain medication to control her pain. Hospitalists were called for admission for all the above.  PAST MEDICAL HISTORY:   Past Medical History  Diagnosis Date  . Fibromyalgia   . Hypertension   . Heart murmur   . Hypothyroidism   . GERD (gastroesophageal reflux disease)   . Depression   . Osteoporosis     PAST SURGICAL HISTORY:   Past Surgical History  Procedure Laterality Date  . Cholecystectomy    . Abdominal hysterectomy      SOCIAL HISTORY:   Social History  Substance Use Topics  . Smoking status: Never Smoker   . Smokeless tobacco: Never Used  . Alcohol Use: No    FAMILY HISTORY:   Family History  Problem  Relation Age of Onset  . Hypertension Mother   . Osteoporosis Mother   . Arthritis Father   . Heart attack Father     DRUG ALLERGIES:   Allergies  Allergen Reactions  . Codeine Nausea And Vomiting  . Penicillins Other (See Comments)    Reaction:  Unknown     MEDICATIONS AT HOME:   Prior to Admission medications   Medication Sig Start Date End Date Taking? Authorizing Provider  b complex vitamins tablet Take 1 tablet by mouth daily.   Yes Historical Provider, MD  baclofen (LIORESAL) 10 MG tablet Take 10 mg by mouth 3 (three) times daily.   Yes Historical Provider, MD  Biotin 1000 MCG tablet Take 1,000 mcg by mouth daily.   Yes Historical Provider, MD  busPIRone (BUSPAR) 15 MG tablet Take 15 mg by mouth 2 (two) times daily.   Yes Historical Provider, MD  carvedilol (COREG) 25 MG tablet Take 25 mg by mouth 2 (two) times daily.   Yes Historical Provider, MD  chlorthalidone (HYGROTON) 25 MG tablet Take 12.5 mg by mouth daily.   Yes Historical Provider, MD  Cholecalciferol (VITAMIN D-3) 1000 UNITS CAPS Take 1,000 Units by mouth daily.   Yes Historical Provider, MD  citalopram (CELEXA) 40 MG tablet Take 40 mg by mouth daily.   Yes Historical Provider, MD  ferrous fumarate-iron polysaccharide complex (TANDEM) 162-115.2 MG CAPS capsule Take 1 capsule by mouth daily.   Yes  Historical Provider, MD  HYDROcodone-acetaminophen (NORCO) 10-325 MG tablet Take 1 tablet by mouth every 6 (six) hours as needed for moderate pain.   Yes Historical Provider, MD  HYDROmorphone (DILAUDID) 2 MG tablet Take 1 tablet (2 mg total) by mouth every 12 (twelve) hours as needed for severe pain. 09/10/15  Yes Jennye Moccasin, MD  levothyroxine (SYNTHROID, LEVOTHROID) 75 MCG tablet Take 75 mcg by mouth daily.   Yes Historical Provider, MD  loratadine (CLARITIN) 10 MG tablet Take 10 mg by mouth daily.   Yes Historical Provider, MD  pantoprazole (PROTONIX) 40 MG tablet Take 40 mg by mouth daily.   Yes Historical Provider,  MD  pregabalin (LYRICA) 25 MG capsule Take 25 mg by mouth 2 (two) times daily.   Yes Historical Provider, MD  simvastatin (ZOCOR) 20 MG tablet Take 20 mg by mouth at bedtime.   Yes Historical Provider, MD  zolpidem (AMBIEN) 5 MG tablet Take 5 mg by mouth at bedtime as needed for sleep.   Yes Historical Provider, MD    REVIEW OF SYSTEMS:  Review of Systems  Constitutional: Negative for fever, chills, weight loss and malaise/fatigue.  HENT: Negative for ear pain, hearing loss and tinnitus.   Eyes: Negative for blurred vision, double vision, pain and redness.  Respiratory: Positive for shortness of breath and wheezing. Negative for cough and hemoptysis.   Cardiovascular: Negative for chest pain, palpitations, orthopnea and leg swelling.  Gastrointestinal: Negative for nausea, vomiting, abdominal pain, diarrhea and constipation.  Genitourinary: Negative for dysuria, frequency and hematuria.  Musculoskeletal: Positive for joint pain (left foot) and falls. Negative for back pain and neck pain.  Skin:       Large left foot blister. No acne, rash  Neurological: Negative for dizziness, tremors, focal weakness and weakness.  Endo/Heme/Allergies: Negative for polydipsia. Does not bruise/bleed easily.  Psychiatric/Behavioral: Negative for depression. The patient is not nervous/anxious and does not have insomnia.      VITAL SIGNS:   Filed Vitals:   09/14/15 1830 09/14/15 1900 09/14/15 1930 09/14/15 2000  BP: 126/60 139/63 128/54 121/60  Pulse: 68 92 78 78  Temp:      TempSrc:      Resp: Height:      Weight:      SpO2: 95% 100% 100% 96%   Wt Readings from Last 3 Encounters:  09/14/15 78.926 kg (174 lb)  09/10/15 78.472 kg (173 lb)  09/07/15 78.472 kg (173 lb)    PHYSICAL EXAMINATION:  Physical Exam  Vitals reviewed. Constitutional: She is oriented to person, place, and time. She appears well-developed and well-nourished. No distress.  HENT:  Head: Normocephalic and  atraumatic.  Mouth/Throat: Oropharynx is clear and moist.  Eyes: Conjunctivae and EOM are normal. Pupils are equal, round, and reactive to light. No scleral icterus.  Neck: Normal range of motion. Neck supple. No JVD present. No thyromegaly present.  Cardiovascular: Normal rate, regular rhythm and intact distal pulses.  Exam reveals no gallop and no friction rub.   Murmur (3/6 systolic murmur) heard. Respiratory: Effort normal and breath sounds normal. No respiratory distress. She has no wheezes. She has no rales.  GI: Soft. Bowel sounds are normal. She exhibits no distension. There is no tenderness.  Musculoskeletal: Normal range of motion. She exhibits no edema.  Left foot splinted and wrapped. No arthritis, no gout  Lymphadenopathy:    She has no cervical adenopathy.  Neurological: She is alert and oriented to person, place, and  time. No cranial nerve deficit.  No dysarthria, no aphasia  Skin: Skin is warm and dry. No rash noted. No erythema.  Large drained blister dorsal aspect left foot.  Psychiatric: She has a normal mood and affect. Her behavior is normal. Judgment and thought content normal.    LABORATORY PANEL:   CBC  Recent Labs Lab 09/14/15 1304  WBC 11.1*  HGB 10.4*  HCT 31.5*  PLT 238   ------------------------------------------------------------------------------------------------------------------  Chemistries   Recent Labs Lab 09/14/15 1304  NA 138  K 3.7  CL 106  CO2 23  GLUCOSE 97  BUN 34*  CREATININE 1.58*  CALCIUM 8.0*  AST 40  ALT 17  ALKPHOS 76  BILITOT 1.3*   ------------------------------------------------------------------------------------------------------------------  Cardiac Enzymes  Recent Labs Lab 09/14/15 1304  TROPONINI <0.03   ------------------------------------------------------------------------------------------------------------------  RADIOLOGY:  Dg Chest 2 View  09/14/2015  CLINICAL DATA:  Shortness of breath,  wheezing, poor historian, personal history hypertension EXAM: CHEST  2 VIEW COMPARISON:  09/10/2015 FINDINGS: Enlargement of cardiac silhouette. Mediastinal contours and pulmonary vascularity normal. Atherosclerotic calcification aorta. Question small LEFT pleural effusion. Lungs otherwise clear. No pneumothorax. Diffuse osseous demineralization. IMPRESSION: Enlargement of cardiac silhouette with suspect small LEFT pleural effusion. Little interval change. Electronically Signed   By: Ulyses Southward M.D.   On: 09/14/2015 17:43    EKG:   Orders placed or performed during the hospital encounter of 09/14/15  . ED EKG  . ED EKG  . EKG 12-Lead  . EKG 12-Lead    IMPRESSION AND PLAN:  Principal Problem:   Falls - likely due to the difficulty she is having with her healing left foot. However, patient is unable to care for herself at home. She has a significant amount of pain with her left foot fracture. Will admit here with pain control. Will order PT and OT to work with her. Active Problems:   Foot fracture, left - continue splint, pain control.   Chronic systolic CHF (congestive heart failure) (HCC) - none in significant exacerbation at this time, however we'll get an echocardiogram to evaluate her function. If she has any more episodes of shortness of breath, can try some IV Lasix.   HTN (hypertension) - continue home medications, currently controlled.   Hypothyroidism - home dose thyroid replacement   GERD (gastroesophageal reflux disease) - equivalent home dose PPI   Depression - continue home meds  All the records are reviewed and case discussed with ED provider. Management plans discussed with the patient and/or family.  DVT PROPHYLAXIS: SubQ heparin  ADMISSION STATUS: Inpatient  CODE STATUS: Full  TOTAL TIME TAKING CARE OF THIS PATIENT: 45 minutes.    Shirlena Brinegar FIELDING 09/14/2015, 8:58 PM  [facility]  Office  [phone]  CC: Primary care physician;  Luna Fuse, MD

## 2015-09-14 NOTE — ED Notes (Signed)
Has large blister on foot, has been seen here twice for the blister,blister is larger

## 2015-09-14 NOTE — ED Provider Notes (Addendum)
[facility] Emergency Department Provider Note  ____________________________________________  Time seen: Approximately 415 PM  I have reviewed the triage vital signs and the nursing notes.   HISTORY  Chief Complaint Sore    HPI Misty Medina is a 74 y.o. female resenting today after being referred to the emergency department after 2 falls at home. She had a mechanical fall home with resulting fracture to her left foot. She also has bruising to her chest. She says that she went to her primary care doctor's office today for follow-up was centered to the emergency department today for admission and placement. The patient is also complaining of a large bulla to her left foot which she said is been worsening since yesterday. She is able to move the toes and is sensate to the toes.She is satting at this time and since the injury she is doing a walker but needs assistance with all transfers. She only has help at home by her son who works all day and so was not there to take care of her during the daytime working hours. Patient says that the bulla over her left foot has erupted over the last 2 days.   Past Medical History  Diagnosis Date  . Fibromyalgia   . Hypertension   . Heart murmur   . Hypothyroidism   . GERD (gastroesophageal reflux disease)   . Depression   . Osteoporosis     Patient Active Problem List   Diagnosis Date Noted  . Falls 09/14/2015  . Foot fracture, left 09/14/2015  . Hypothyroidism 09/14/2015  . Suspected CHF (congestive heart failure) 09/14/2015  . HTN (hypertension) 09/14/2015  . GERD (gastroesophageal reflux disease) 09/14/2015  . Depression 09/14/2015    Past Surgical History  Procedure Laterality Date  . Cholecystectomy    . Abdominal hysterectomy      Current Outpatient Rx  Name  Route  Sig  Dispense  Refill  . b complex vitamins tablet   Oral   Take 1 tablet by mouth daily.         . baclofen (LIORESAL) 10 MG  tablet   Oral   Take 10 mg by mouth 3 (three) times daily.         . Biotin 1000 MCG tablet   Oral   Take 1,000 mcg by mouth daily.         . busPIRone (BUSPAR) 15 MG tablet   Oral   Take 15 mg by mouth 2 (two) times daily.         . carvedilol (COREG) 25 MG tablet   Oral   Take 25 mg by mouth 2 (two) times daily.         . chlorthalidone (HYGROTON) 25 MG tablet   Oral   Take 12.5 mg by mouth daily.         . Cholecalciferol (VITAMIN D-3) 1000 UNITS CAPS   Oral   Take 1,000 Units by mouth daily.         . citalopram (CELEXA) 40 MG tablet   Oral   Take 40 mg by mouth daily.         . ferrous fumarate-iron polysaccharide complex (TANDEM) 162-115.2 MG CAPS capsule   Oral   Take 1 capsule by mouth daily.         Marland Kitchen. HYDROcodone-acetaminophen (NORCO) 10-325 MG tablet   Oral   Take 1 tablet by mouth every 6 (six) hours as needed for moderate pain.         .Marland Kitchen  HYDROmorphone (DILAUDID) 2 MG tablet   Oral   Take 1 tablet (2 mg total) by mouth every 12 (twelve) hours as needed for severe pain.   20 tablet   0   . levothyroxine (SYNTHROID, LEVOTHROID) 75 MCG tablet   Oral   Take 75 mcg by mouth daily.         Marland Kitchen loratadine (CLARITIN) 10 MG tablet   Oral   Take 10 mg by mouth daily.         . pantoprazole (PROTONIX) 40 MG tablet   Oral   Take 40 mg by mouth daily.         . pregabalin (LYRICA) 25 MG capsule   Oral   Take 25 mg by mouth 2 (two) times daily.         . simvastatin (ZOCOR) 20 MG tablet   Oral   Take 20 mg by mouth at bedtime.         Marland Kitchen zolpidem (AMBIEN) 5 MG tablet   Oral   Take 5 mg by mouth at bedtime as needed for sleep.           Allergies Codeine and Penicillins  Family History  Problem Relation Age of Onset  . Hypertension Mother   . Osteoporosis Mother   . Arthritis Father   . Heart attack Father     Social History Social History  Substance Use Topics  . Smoking status: Never Smoker   . Smokeless  tobacco: Never Used  . Alcohol Use: No    Review of Systems Constitutional: No fever/chills Eyes: No visual changes. ENT: No sore throat. Cardiovascular: Chest pain over the past week after a fall where she bruised her chest. Respiratory: Denies shortness of breath. Gastrointestinal: No abdominal pain.  No nausea, no vomiting.  No diarrhea.  No constipation. Genitourinary: Negative for dysuria. Musculoskeletal: Negative for back pain. Skin: Negative for rash. Neurological: Negative for headaches, focal weakness or numbness.  10-point ROS otherwise negative.  ____________________________________________   PHYSICAL EXAM:  VITAL SIGNS: ED Triage Vitals  Enc Vitals Group     BP 09/14/15 1251 132/70 mmHg     Pulse Rate 09/14/15 1251 85     Resp 09/14/15 1251 20     Temp 09/14/15 1251 98.2 F (36.8 C)     Temp Source 09/14/15 1251 Oral     SpO2 09/14/15 1251 93 %     Weight 09/14/15 1251 174 lb (78.926 kg)     Height 09/14/15 1251  (1.626 m)     Head Cir --      Peak Flow --      Pain Score 09/14/15 1252 9     Pain Loc --      Pain Edu? --      Excl. in GC? --     Constitutional: Alert and oriented. Well appearing and in no acute distress. Eyes: Conjunctivae are normal. PERRL. EOMI. Head: Atraumatic. Nose: No congestion/rhinnorhea. Mouth/Throat: Mucous membranes are moist.  Oropharynx non-erythematous. Neck: No stridor.   Cardiovascular: Normal rate, regular rhythm. Grossly normal heart sounds.  Good peripheral circulation. Respiratory: Normal respiratory effort.  No retractions. Wheezing diffusely but with more wheezes in the right lung.  Gastrointestinal: Soft and nontender. No distention. No abdominal bruits. No CVA tenderness. Musculoskeletal: Bilateral lower extremity edema which is worse to the left foot. There is ecchymosis around the left foot and a large bulla over the base of the second third fourth and fifth toes extending onto  the forefoot. The bulla is  filled with maroon serosanguineous fluid and is tense. It is about 4 x 6 cm at its base and about 4 cm tall. There is also ecchymosis across the chest with tenderness to palpation.  Neurologic:  Normal speech and language. No gross focal neurologic deficits are appreciated.  Skin:  Skin is warm, dry and intact. No rash noted. Psychiatric: Mood and affect are normal. Speech and behavior are normal.  ____________________________________________   LABS (all labs ordered are listed, but only abnormal results are displayed)  Labs Reviewed  CBC WITH DIFFERENTIAL/PLATELET - Abnormal; Notable for the following:    WBC 11.1 (*)    RBC 3.35 (*)    Hemoglobin 10.4 (*)    HCT 31.5 (*)    Neutro Abs 7.3 (*)    All other components within normal limits  COMPREHENSIVE METABOLIC PANEL - Abnormal; Notable for the following:    BUN 34 (*)    Creatinine, Ser 1.58 (*)    Calcium 8.0 (*)    Total Protein 6.0 (*)    Albumin 2.8 (*)    Total Bilirubin 1.3 (*)    GFR calc non Af Amer 31 (*)    GFR calc Af Amer 36 (*)    All other components within normal limits  CK - Abnormal; Notable for the following:    Total CK 858 (*)    All other components within normal limits  TROPONIN I  CK   ____________________________________________  EKG   ____________________________________________  RADIOLOGY   ____________________________________________   PROCEDURES The nurse unwrapped the splint prior to my arrival. I did discuss with her the tightness of the wrap which she said was fairly tight.  INCISION AND DRAINAGE Performed by: Arelia Longest Consent: Verbal consent obtained. Risks and benefits: risks, benefits and alternatives were discussed Type: Hemorrhagic bulla  Cleanse with chlorhexidine.   Body area: Left foot   Incision was made with a 18-gauge needle.   Drainage: Serosanguineous   Drainage amount: 40 mL's   Dressed with bacitracin and iodoform gauze. Did not debridement  as per discussion with Dr. Margo Common.     Patient tolerance: Patient tolerated the procedure well with no immediate complications.    ____________________________________________   INITIAL IMPRESSION / ASSESSMENT AND PLAN / ED COURSE  Pertinent labs & imaging results that were available during my care of the patient were reviewed by me and considered in my medical decision making (see chart for details).  ----------------------------------------- 4:59 PM on 09/14/2015 -----------------------------------------  Discussed case with Dr. Margo Common at [facility] because of the large bulla. She recommends treating the bulla with an 18-gauge needle and placing Mepilex over the lesion or bacitracin or Xeroform. If bacitracin Xeroform is placed and we will have to do a dressing change daily. Patient was also seen by social work says the patient, due to insurance issues, would not be able to replace at this time unless she was admitted to the hospital for 3 day stay.  ----------------------------------------- 8:26 PM on 09/14/2015 -----------------------------------------  Patient with improved respiratory rate and no respiratory distress at this time. However has required multiple doses of IV narcotic pain medications. Also attempted to walk with assistance and had a great deal difficulty even with 2 person assist. We'll admit for pain control as well as placement. Explained to Dr. Anne Hahn who is the admitting hospitalist the need for daily dressing changes to the left foot. The splint was also rewrapped loosely as cannot re-create any further  bullae or swelling to foot and compression of the leg. ____________________________________________   FINAL CLINICAL IMPRESSION(S) / ED DIAGNOSES  Intractable pain. Left foot bulla.    Myrna Blazer, MD 09/14/15 2029  Myrna Blazer, MD 10/12/15 [phone]

## 2015-09-15 ENCOUNTER — Observation Stay: Payer: Medicare Other

## 2015-09-15 ENCOUNTER — Inpatient Hospital Stay (HOSPITAL_BASED_OUTPATIENT_CLINIC_OR_DEPARTMENT_OTHER)
Admit: 2015-09-15 | Discharge: 2015-09-15 | Disposition: A | Discharge status: Home / Self Care | Payer: Medicare Other | Attending: Internal Medicine | Admitting: Internal Medicine

## 2015-09-15 DIAGNOSIS — W19XXXA Unspecified fall, initial encounter: Secondary | ICD-10-CM | POA: Diagnosis present

## 2015-09-15 DIAGNOSIS — I351 Nonrheumatic aortic (valve) insufficiency: Secondary | ICD-10-CM

## 2015-09-15 DIAGNOSIS — R296 Repeated falls: Secondary | ICD-10-CM | POA: Diagnosis present

## 2015-09-15 DIAGNOSIS — R52 Pain, unspecified: Secondary | ICD-10-CM | POA: Diagnosis not present

## 2015-09-15 LAB — BASIC METABOLIC PANEL
ANION GAP: 10 (ref 5–15)
BUN: 32 mg/dL — ABNORMAL HIGH (ref 6–20)
CHLORIDE: 106 mmol/L (ref 101–111)
CO2: 22 mmol/L (ref 22–32)
Calcium: 7.9 mg/dL — ABNORMAL LOW (ref 8.9–10.3)
Creatinine, Ser: 1.43 mg/dL — ABNORMAL HIGH (ref 0.44–1.00)
GFR calc Af Amer: 41 mL/min — ABNORMAL LOW (ref >60)
GFR, EST NON AFRICAN AMERICAN: 35 mL/min — AB (ref >60)
GLUCOSE: 149 mg/dL — AB (ref 65–99)
POTASSIUM: 4.2 mmol/L (ref 3.5–5.1)
SODIUM: 138 mmol/L (ref 135–145)

## 2015-09-15 LAB — CBC
HEMATOCRIT: 29.7 % — AB (ref 35.0–47.0)
HEMOGLOBIN: 10.1 g/dL — AB (ref 12.0–16.0)
MCH: 32.3 pg (ref 26.0–34.0)
MCHC: 33.8 g/dL (ref 32.0–36.0)
MCV: 95.4 fL (ref 80.0–100.0)
Platelets: 206 10*3/uL (ref 150–440)
RBC: 3.12 MIL/uL — AB (ref 3.80–5.20)
RDW: 13.3 % (ref 11.5–14.5)
WBC: 9.2 10*3/uL (ref 3.6–11.0)

## 2015-09-15 LAB — CK: Total CK: 669 U/L — ABNORMAL HIGH (ref 38–234)

## 2015-09-15 MED ORDER — OXYCODONE HCL 5 MG PO TABS
5.0000 mg | ORAL_TABLET | ORAL | Status: DC | PRN
  Administered 2015-09-15 – 2015-09-16 (×5): 5 mg via ORAL
  Filled 2015-09-15 (×5): qty 1

## 2015-09-15 MED ORDER — LISINOPRIL 5 MG PO TABS
5.0000 mg | ORAL_TABLET | Freq: Two times a day (BID) | ORAL | Status: DC
  Administered 2015-09-15 – 2015-09-16 (×2): 5 mg via ORAL
  Filled 2015-09-15 (×2): qty 1

## 2015-09-15 MED ORDER — ENOXAPARIN SODIUM 40 MG/0.4ML ~~LOC~~ SOLN
40.0000 mg | SUBCUTANEOUS | Status: DC
  Administered 2015-09-16: 40 mg via SUBCUTANEOUS
  Filled 2015-09-15: qty 0.4

## 2015-09-15 NOTE — Clinical Social Work Placement (Signed)
   CLINICAL SOCIAL WORK PLACEMENT  NOTE  Date:  09/15/2015  Patient Details  Name: Misty Medina MRN: [id number] Date of Birth: 04/11/1941  Clinical Social Work is seeking post-discharge placement for this patient at the Skilled  Nursing Facility level of care (*CSW will initial, date and re-position this form in  chart as items are completed):  Yes   Patient/family provided with [facility] Clinical Social Work Department's list of facilities offering this level of care within the geographic area requested by the patient (or if unable, by the patient's family).  Yes   Patient/family informed of their freedom to choose among providers that offer the needed level of care, that participate in Medicare, Medicaid or managed care program needed by the patient, have an available bed and are willing to accept the patient.  Yes   Patient/family informed of [facility]'s ownership interest in [facility] and [facility], as well as of the fact that they are under no obligation to receive care at these facilities.  PASRR submitted to EDS on 09/15/15     PASRR number received on       Existing PASRR number confirmed on       FL2 transmitted to all facilities in geographic area requested by pt/family on 09/15/15     FL2 transmitted to all facilities within larger geographic area on       Patient informed that his/her managed care company has contracts with or will negotiate with certain facilities, including the following:            Patient/family informed of bed offers received.  Patient chooses bed at       Physician recommends and patient chooses bed at      Patient to be transferred to   on  .  Patient to be transferred to facility by       Patient family notified on   of transfer.  Name of family member notified:        PHYSICIAN Please sign FL2     Additional Comment:    _______________________________________________ Haig ProphetMorgan, Kazi Montoro G, LCSW 09/15/2015, 2:43  PM

## 2015-09-15 NOTE — Progress Notes (Signed)
[facility] - [place] at [facility]   PATIENT NAME: Misty Medina    MR#:  [id number]  DATE OF BIRTH:  1941/09/23  SUBJECTIVE:  CHIEF COMPLAINT:   Chief Complaint  Patient presents with  . Sore    Review of Systems  Constitutional: Negative for fever, chills and weight loss.  HENT: Negative for congestion.   Eyes: Negative for blurred vision and double vision.  Respiratory: Negative for cough, sputum production, shortness of breath and wheezing.   Cardiovascular: Negative for chest pain, palpitations, orthopnea, leg swelling and PND.  Gastrointestinal: Positive for abdominal pain and constipation. Negative for nausea, vomiting, diarrhea and blood in stool.  Genitourinary: Negative for dysuria, urgency, frequency and hematuria.  Musculoskeletal: Negative for falls.  Neurological: Negative for dizziness, tremors, focal weakness and headaches.  Endo/Heme/Allergies: Does not bruise/bleed easily.  Psychiatric/Behavioral: Negative for depression. The patient does not have insomnia.     VITAL SIGNS: Blood pressure 177/69, pulse 88, temperature 97.9 F (36.6 C), temperature source Oral, resp. rate 20, height  (1.626 m), weight 105.325 kg (232 lb 3.2 oz), SpO2 94 %.  PHYSICAL EXAMINATION:   GENERAL:  74 y.o.-year-old patient lying in the bed in moderate distress, uncomfortable and miserable, holding her abdomen.  EYES: Pupils equal, round, reactive to light and accommodation. No scleral icterus. Extraocular muscles intact.  HEENT: Head atraumatic, normocephalic. Oropharynx and nasopharynx clear.  NECK:  Supple, no jugular venous distention. No thyroid enlargement, no tenderness.  LUNGS: Some diminished breath sounds bilaterally, no wheezing, few rales,rhonchi or crepitation. No use of accessory muscles of respiration.  CARDIOVASCULAR: S1, S2 normal. No murmurs, rubs, or gallops.  ABDOMEN: Soft, nontender, nondistended. Bowel sounds present. No organomegaly  or mass.  EXTREMITIES: No pedal edema, cyanosis, or clubbing.  NEUROLOGIC: Cranial nerves II through XII are intact. Muscle strength 5/5 in all extremities. Sensation intact. Gait not checked.  PSYCHIATRIC: The patient is alert and oriented x 3. Said affect,  uncomfortable SKIN: No obvious rash, lesion, or ulcer. Multiple skin bruises. Left foot is in the wrap  ORDERS/RESULTS REVIEWED:   CBC  Recent Labs Lab 09/10/15 2035 09/14/15 1304 09/15/15 0715  WBC 9.1 11.1* 9.2  HGB 10.5* 10.4* 10.1*  HCT 31.8* 31.5* 29.7*  PLT 180 238 206  MCV 95.8 94.0 95.4  MCH 31.6 31.1 32.3  MCHC 33.0 33.1 33.8  RDW 13.8 13.3 13.3  LYMPHSABS 1.6 2.4  --   MONOABS 0.4 0.7  --   EOSABS 0.8* 0.7  --   BASOSABS 0.1 0.1  --    ------------------------------------------------------------------------------------------------------------------  Chemistries   Recent Labs Lab 09/10/15 2035 09/14/15 1304 09/15/15 0715  NA 141 138 138  K 3.8 3.7 4.2  CL 110 106 106  CO2 GLUCOSE 122* 97 149*  BUN 25* 34* 32*  CREATININE 2.42* 1.58* 1.43*  CALCIUM 7.8* 8.0* 7.9*  AST  --  40  --   ALT  --  17  --   ALKPHOS  --  76  --   BILITOT  --  1.3*  --    ------------------------------------------------------------------------------------------------------------------ estimated creatinine clearance is 40.8 mL/min (by C-G formula based on Cr of 1.43). ------------------------------------------------------------------------------------------------------------------ No results for input(s): TSH, T4TOTAL, T3FREE, THYROIDAB in the last 72 hours.  Invalid input(s): FREET3  Cardiac Enzymes  Recent Labs Lab 09/14/15 1304  TROPONINI <0.03   ------------------------------------------------------------------------------------------------------------------ Invalid input(s):  POCBNP ---------------------------------------------------------------------------------------------------------------  RADIOLOGY: Dg Chest 2 View  09/14/2015  CLINICAL DATA:  Shortness of  breath, wheezing, poor historian, personal history hypertension EXAM: CHEST  2 VIEW COMPARISON:  09/10/2015 FINDINGS: Enlargement of cardiac silhouette. Mediastinal contours and pulmonary vascularity normal. Atherosclerotic calcification aorta. Question small LEFT pleural effusion. Lungs otherwise clear. No pneumothorax. Diffuse osseous demineralization. IMPRESSION: Enlargement of cardiac silhouette with suspect small LEFT pleural effusion. Little interval change. Electronically Signed   By: Ulyses SouthwardMark  Boles M.D.   On: 09/14/2015 17:43    EKG:  Orders placed or performed during the hospital encounter of 09/14/15  . ED EKG  . ED EKG  . EKG 12-Lead  . EKG 12-Lead    ASSESSMENT AND PLAN:  Principal Problem:   Frequent falls Active Problems:   Foot fracture, left   Hypothyroidism   Chronic systolic CHF (congestive heart failure) (HCC)   HTN (hypertension)   GERD (gastroesophageal reflux disease)   Depression   Falls 1. Left foot fracture, advanced pain medications since patient is very uncomfortable. Get podiatrist involved for further recommendations. Physical therapist per podiatrist's recommendations as well, may need rehabilitation placement 2. Abdominal pain, likely constipation related had bowel movement today. Follow clinically tomorrow 3. Chronic systolic CHF, stable. Patient is to continue home medications, Oxygenation satisfactory. Control blood pressure 4. Malignant essential hypertension, continue Coreg, add lisinopril, follow creatinine closely 5. Acute on chronic renal failure, some better after holding diuretic, follow with ACE inhibitor use   Management plans discussed with the patient, family and they are in agreement.   DRUG ALLERGIES:  Allergies  Allergen Reactions  . Codeine  Nausea And Vomiting  . Penicillins Other (See Comments)    Reaction:  Unknown     CODE STATUS:     Code Status Orders        Start     Ordered   09/14/15 2237  Full code   Continuous     09/14/15 2236      TOTAL TIME TAKING CARE OF THIS PATIENT: 40 minutes.    Katharina CaperVAICKUTE,Mardy Lucier M.D on 09/15/2015 at 2:03 PM  Between 7am to 6pm - Pager - [phone]  After 6pm go to [email or web address] - password [email or web address] [facility]  [facility]  Office  [phone]  CC: Primary care physician; Luna FuseEJAN-SIE, SHEIKH AHMED, MD

## 2015-09-15 NOTE — Consult Note (Signed)
Reason for Consult: Swelling and bruising in the left foot after multiple falls recently. Also a blistered area on the dorsum of the left foot. Referring Physician: Dr. Abran Duke is an 74 y.o. female.  HPI: The patient was recently seen in the emergency department for an injury to her left foot. X-rays were taken and she was diagnosed with a fracture in her left fifth toe. She was placed in a compressive dressing and sent home. States that over the weekend she had multiple falls in her home. Currently has inability to put any significant weight on the left foot. She did present to her primary care physician yesterday who took the bandage off and noticed a large blister area on the left forefoot. Recommended the patient come to the emergency department for admission and deroofing of the blister.  Past Medical History  Diagnosis Date  . Fibromyalgia   . Hypertension   . Heart murmur   . Hypothyroidism   . GERD (gastroesophageal reflux disease)   . Depression   . Osteoporosis     Past Surgical History  Procedure Laterality Date  . Cholecystectomy    . Abdominal hysterectomy      Family History  Problem Relation Age of Onset  . Hypertension Mother   . Osteoporosis Mother   . Arthritis Father   . Heart attack Father     Social History:  reports that she has never smoked. She has never used smokeless tobacco. She reports that she does not drink alcohol or use illicit drugs.  Allergies:  Allergies  Allergen Reactions  . Codeine Nausea And Vomiting  . Penicillins Other (See Comments)    Reaction:  Unknown     Medications: I have reviewed the patient's current medications.  Results for orders placed or performed during the hospital encounter of 09/14/15 (from the past 48 hour(s))  CBC with Differential     Status: Abnormal   Collection Time: 09/14/15  1:04 PM  Result Value Ref Range   WBC 11.1 (H) 3.6 - 11.0 K/uL   RBC 3.35 (L) 3.80 - 5.20 MIL/uL   Hemoglobin 10.4  (L) 12.0 - 16.0 g/dL   HCT 31.5 (L) 35.0 - 47.0 %   MCV 94.0 80.0 - 100.0 fL   MCH 31.1 26.0 - 34.0 pg   MCHC 33.1 32.0 - 36.0 g/dL   RDW 13.3 11.5 - 14.5 %   Platelets 238 150 - 440 K/uL   Neutrophils Relative % 66 %   Neutro Abs 7.3 (H) 1.4 - 6.5 K/uL   Lymphocytes Relative 21 %   Lymphs Abs 2.4 1.0 - 3.6 K/uL   Monocytes Relative 6 %   Monocytes Absolute 0.7 0.2 - 0.9 K/uL   Eosinophils Relative 6 %   Eosinophils Absolute 0.7 0 - 0.7 K/uL   Basophils Relative 1 %   Basophils Absolute 0.1 0 - 0.1 K/uL  Comprehensive metabolic panel     Status: Abnormal   Collection Time: 09/14/15  1:04 PM  Result Value Ref Range   Sodium 138 135 - 145 mmol/L   Potassium 3.7 3.5 - 5.1 mmol/L   Chloride 106 101 - 111 mmol/L   CO2 23 22 - 32 mmol/L   Glucose, Bld 97 65 - 99 mg/dL   BUN 34 (H) 6 - 20 mg/dL   Creatinine, Ser 1.58 (H) 0.44 - 1.00 mg/dL   Calcium 8.0 (L) 8.9 - 10.3 mg/dL   Total Protein 6.0 (L) 6.5 - 8.1 g/dL  Albumin 2.8 (L) 3.5 - 5.0 g/dL   AST 40 15 - 41 U/L   ALT 17 14 - 54 U/L   Alkaline Phosphatase 76 38 - 126 U/L   Total Bilirubin 1.3 (H) 0.3 - 1.2 mg/dL   GFR calc non Af Amer 31 (L) >60 mL/min   GFR calc Af Amer 36 (L) >60 mL/min    Comment: (NOTE) The eGFR has been calculated using the CKD EPI equation. This calculation has not been validated in all clinical situations. eGFR's persistently <60 mL/min signify possible Chronic Kidney Disease.    Anion gap 9 5 - 15  CK     Status: Abnormal   Collection Time: 09/14/15  1:04 PM  Result Value Ref Range   Total CK 858 (H) 38 - 234 U/L  Troponin I     Status: None   Collection Time: 09/14/15  1:04 PM  Result Value Ref Range   Troponin I <0.03 <0.031 ng/mL    Comment:        NO INDICATION OF MYOCARDIAL INJURY.   CK     Status: Abnormal   Collection Time: 09/14/15 11:37 PM  Result Value Ref Range   Total CK 669 (H) 38 - 234 U/L  Basic metabolic panel     Status: Abnormal   Collection Time: 09/15/15  7:15 AM   Result Value Ref Range   Sodium 138 135 - 145 mmol/L   Potassium 4.2 3.5 - 5.1 mmol/L   Chloride 106 101 - 111 mmol/L   CO2 22 22 - 32 mmol/L   Glucose, Bld 149 (H) 65 - 99 mg/dL   BUN 32 (H) 6 - 20 mg/dL   Creatinine, Ser 1.43 (H) 0.44 - 1.00 mg/dL   Calcium 7.9 (L) 8.9 - 10.3 mg/dL   GFR calc non Af Amer 35 (L) >60 mL/min   GFR calc Af Amer 41 (L) >60 mL/min    Comment: (NOTE) The eGFR has been calculated using the CKD EPI equation. This calculation has not been validated in all clinical situations. eGFR's persistently <60 mL/min signify possible Chronic Kidney Disease.    Anion gap 10 5 - 15  CBC     Status: Abnormal   Collection Time: 09/15/15  7:15 AM  Result Value Ref Range   WBC 9.2 3.6 - 11.0 K/uL   RBC 3.12 (L) 3.80 - 5.20 MIL/uL   Hemoglobin 10.1 (L) 12.0 - 16.0 g/dL   HCT 29.7 (L) 35.0 - 47.0 %   MCV 95.4 80.0 - 100.0 fL   MCH 32.3 26.0 - 34.0 pg   MCHC 33.8 32.0 - 36.0 g/dL   RDW 13.3 11.5 - 14.5 %   Platelets 206 150 - 440 K/uL    Dg Chest 2 View  09/14/2015  CLINICAL DATA:  Shortness of breath, wheezing, poor historian, personal history hypertension EXAM: CHEST  2 VIEW COMPARISON:  09/10/2015 FINDINGS: Enlargement of cardiac silhouette. Mediastinal contours and pulmonary vascularity normal. Atherosclerotic calcification aorta. Question small LEFT pleural effusion. Lungs otherwise clear. No pneumothorax. Diffuse osseous demineralization. IMPRESSION: Enlargement of cardiac silhouette with suspect small LEFT pleural effusion. Little interval change. Electronically Signed   By: Lavonia Dana M.D.   On: 09/14/2015 17:43   Dg Foot Complete Left  09/15/2015  CLINICAL DATA:  Persistent swelling and bruising. EXAM: LEFT FOOT - COMPLETE 3+ VIEW COMPARISON:  09/10/2015 FINDINGS: Overlying cast material obscures bony detail. Nondisplaced fracture involves the proximal portion the proximal phalanx of fifth digit, intra-articular. No callus  deposition. No new fracture  identified. Dorsal forefoot soft tissue swelling is moderate and similar. IMPRESSION: Redemonstration of nondisplaced fracture at the base of the fifth metatarsal. Soft tissue swelling, without acute superimposed process. Electronically Signed   By: Abigail Miyamoto M.D.   On: 09/15/2015 16:38    Review of Systems  Constitutional: Negative.   HENT: Negative.   Eyes: Negative.   Respiratory: Negative.   Cardiovascular: Negative.   Gastrointestinal: Positive for abdominal pain and constipation.  Genitourinary: Negative.   Musculoskeletal:       Relate significant pain in the left foot with inability to put any weight on the foot  Skin:       Swelling and bruising in the left foot with a recently drained blister  Neurological: Negative.   Endo/Heme/Allergies: Negative.   Psychiatric/Behavioral: Negative.    Blood pressure 149/60, pulse 86, temperature 98.7 F (37.1 C), temperature source Oral, resp. rate 18, height _0  (1.626 m), weight 105.325 kg (232 lb 3.2 oz), SpO2 95 %. Physical Exam  Cardiovascular:  DP and PT pulses are fully palpable bilateral. Capillary filling time intact.  Musculoskeletal:  Very guarded range of motion in the left foot and ankle with exquisite pain on any attempt at palpation or motion in the left forefoot. Some pain is present in the right forefoot region. Muscle testing is deferred due to pain  Neurological:  Epicritic sensations appear grossly intact and symmetric  Skin:  The skin is warm dry and somewhat atrophic with significant edema and ecchymosis in the left forefoot with ecchymosis extending up into the digits. There is a drained blister area on the dorsum of the left forefoot with some superficial skin slough. Underlying skin bed does not have any deep extension through the superficial tissues. No signs of purulence or infection    Assessment/Plan: Assessment: 1. Fracture proximal phalanx left fifth toe. 2. Blister left forefoot.  Plan: Excisionally  debrided the superficial layer of skin from the drained blister on the dorsum of the left foot. This was performed sharply using a pair of tissue nippers with a diameter of approximately 4 cm. Xeroform and a sterile bandage was reapplied followed by the splint previously applied to the left foot discussed with the patient that there does not appear to be any additional fractures noted on x-rays taken today as compared to the ones taken 5 days ago. She will need local wound care with dressing changes every couple of days. Due to her inability to bear weight I would recommend evaluation by physical therapy. She may be full weightbearing as tolerated in a surgical shoe which we will order. Also will need to work on her range of motion. If unable to safely bear weight in the surgical shoe she may need some skilled nursing for wound care and physical therapy. At this point we will follow up with her outpatient after discharge or reconsult if needed prior to that.  Tanesha Arambula W. 09/15/2015, 5:57 PM

## 2015-09-15 NOTE — Consult Note (Signed)
WOC wound consult note Reason for Consult:Large serum filled blister to left dorsal foot after a fall.  Has fracture (chip) of left fifth metatarsal and a posterior leg splint and ace wrap in place.  This is replaced today after wound care. Podiatry consult is pending.  Wound type: Trauma wound to left dorsal foot after fall.  Pressure Ulcer POA: N/A Measurement:6 cm x 4.8 cm ruptured serum filled blister.  Bruising noted to all left foot toes and foot.  Wound ZOX:WRUEAbed:Serum filled blister that is oozing serum.  Drainage (amount, consistency, odor) Clear serum.  No odor.  Periwound:Bruising Dressing procedure/placement/frequency:Cleanse left dorsal foot blister with NS and pat dry.  Apply Xeroform gause to blister.  Cover with 4x4 gauze, kerlix.  Replace left lower extremity splint to posterior leg and foot and secure with ace wrap.  Change Mon/Wed/Fri.  Will not follow at this time.  Please re-consult if needed.  Maple HudsonKaren Judd Mccubbin RN BSN CWON Pager [phone]

## 2015-09-15 NOTE — Clinical Social Work Note (Signed)
Clinical Social Work Assessment  Patient Details  Name: Misty Medina MRN: [id number] Date of Birth: [birth year]  Date of referral:  09/15/15               Reason for consult:  Facility Placement                Permission sought to share information with:  Chartered certified accountant granted to share information::  Yes, Verbal Permission Granted  Name::      [place]::   [place]   Relationship::     Contact Information:     Housing/Transportation Living arrangements for the past 2 months:  [place] of Information:  Patient, Other (Comment Required) Biomedical scientist ) Patient Interpreter Needed:  None Criminal Activity/Legal Involvement Pertinent to Current Situation/Hospitalization:  No - Comment as needed Significant Relationships:  Adult Children, Other Family Members (Grandson ) Lives with:  Other (Comment) (Grandson ) Do you feel safe going back to the place where you live?  Yes Need for family participation in patient care:  Yes (Comment)  Care giving concerns: Patient lives with her grandson Solon Palm in [place].    Social Worker assessment / plan: Holiday representative (CSW) received SNF consult. PT is pending. Patient is Medicare observation and has community Medicaid not long term are Medicaid per [place]ance Gather. CSW met with patient alone at bedside this morning. Patient was laying in the bed and was alert and oriented. Patient reported that she lives in [place] with her grandson. Patient reported that she has not been able to walk for a month now and has been using a walker and getting her grandson to assist her. Patient reported that her daughter Juliann Pulse is her HPOA. Patient reported that she receives $973 per month in SSI. CSW explained to patient that she is under Medicare observation, which means Medicare will not pay for short term rehab. Patient reported that she cannot pay privately for SNF. CSW explained that  if patient remains under Medicare observation and needs placement a search will be started for a Medicaid bed. CSW explained that patient will have to stay for 30 days under Medicaid and will likely go outside of [facility]. Patient reported that she would go home if she had to go outside of [facility]   CSW met with patient for a second time with her grandson Larkin Ina at bedside. CSW made grandson aware of above. Grandson reported that patient cannot come home because he works during the day. CSW asked how patient and grandson have been managing for the past month and grandson reported that patient has only been like this for 1 week. Per grandson he has to lift her onto the bedside commode. Grandson reported that he was off work 3 days last week and then had to hire people to stay with her while he was at work. Patient and grandson asked about going to [facility]. CSW explained that a referral can be sent to [facility] but since patient can't walk she may need a higher level of care like SNF. Patient reported that she has been to [facility] before 3 years ago and could not walk. Patient did agree to extend bed search outside of [facility].   FL2 complete and PASARR pending. CSW faxed out to [place], [facility] and [place]. CSW also sent referral to [place] [facility]. CSW contacted [employer], Helene Kelp with [employer],  Jorje Guild with [place] with [facility] and asked them to review referral. CSW will continue to follow and assist as needed.   Employment status:  Disabled (Comment on whether or not currently receiving Disability), Retired Forensic scientist:  Information systems manager, Medicaid In [employer] ) PT Recommendations:  Not assessed at this time Information / Referral to community resources:  [place]  Patient/Family's Response to care: Patient and grandson are agreeable to bed search outside of  [place].  Patient/Family's Understanding of and Emotional Response to Diagnosis, Current Treatment, and Prognosis: Patient and grandson were pleasant throughout assessment.   Emotional Assessment Appearance:  Appears stated age Attitude/Demeanor/Rapport:    Affect (typically observed):  Accepting, Adaptable, Pleasant Orientation:  Oriented to Self, Oriented to Place, Oriented to  Time, Oriented to Situation Alcohol / Substance use:  Not Applicable Psych involvement (Current and /or in the community):  No (Comment)  Discharge Needs  Concerns to be addressed:  Discharge Planning Concerns Readmission within the last 30 days:  No Current discharge risk:  Dependent with Mobility Barriers to Discharge:  Continued Medical Work up   Loralyn Freshwater, LCSW 09/15/2015, 2:45 PM

## 2015-09-15 NOTE — [place] FL2 (Signed)
[place] MEDICAID FL2 LEVEL OF CARE SCREENING TOOL     IDENTIFICATION  Patient Name: Misty Medina Birthdate: 06/22/1941 Sex: female Admission Date (Current Location): 09/14/2015  [place]  [facility]  ([id number]) Facility and Address:  [facility], [address], [place], [place] [address]      Provider Number: [id number] [phone]([id number])  Attending Physician Name and Address:  Katharina Caperima Vaickute, MD  Relative Name and Phone Number:       Current Level of Care: Hospital Recommended Level of Care: Skilled Nursing Facility, Assisted Living Facility Prior Approval Number:    Date Approved/Denied:   PASRR Number:    Discharge Plan: SNF    Current Diagnoses: Patient Active Problem List   Diagnosis Date Noted  . Falls 09/15/2015  . Frequent falls 09/14/2015  . Foot fracture, left 09/14/2015  . Hypothyroidism 09/14/2015  . Chronic systolic CHF (congestive heart failure) (HCC) 09/14/2015  . HTN (hypertension) 09/14/2015  . GERD (gastroesophageal reflux disease) 09/14/2015  . Depression 09/14/2015    Orientation ACTIVITIES/SOCIAL BLADDER RESPIRATION    Self, Time, Situation, Place  Active Incontinent Normal  BEHAVIORAL SYMPTOMS/MOOD NEUROLOGICAL BOWEL NUTRITION STATUS   (none )  (none ) Incontinent Diet (Diet: Heart Healthy )  PHYSICIAN VISITS COMMUNICATION OF NEEDS Height & Weight Skin  30 days Verbally 5\' 4"  (162.6 cm) 232 lbs. Other (Comment) (Large serum filled blister to left dorsal foot. 6 cm x 4.8 cm ruptured serum filled blister. Cleanse left dorsal foot blister with NS and pat dry. Apply Xeroform gause to blister. )          AMBULATORY STATUS RESPIRATION    Assist extensive Normal      Personal Care Assistance Level of Assistance  Bathing, Feeding, Dressing Bathing Assistance: Limited assistance Feeding assistance: Independent Dressing Assistance: Limited assistance      Functional Limitations Info   Sight, Hearing, Speech Sight Info: Adequate Hearing Info: Adequate Speech Info: Adequate       SPECIAL CARE FACTORS FREQUENCY  PT (By licensed PT), OT (By licensed OT)     PT Frequency:  (5) OT Frequency:  (5)           Additional Factors Info  Code Status, Allergies Code Status Info:  (Full Code. ) Allergies Info:  (Codeine, Penicillins. )           Current Medications (09/15/2015): Current Facility-Administered Medications  Medication Dose Route Frequency Provider Last Rate Last Dose  . acetaminophen (TYLENOL) tablet 650 mg  650 mg Oral Q6H PRN Oralia Manisavid Willis, MD       Or  . acetaminophen (TYLENOL) suppository 650 mg  650 mg Rectal Q6H PRN Oralia Manisavid Willis, MD      . busPIRone (BUSPAR) tablet 15 mg  15 mg Oral BID Oralia Manisavid Willis, MD   15 mg at 09/15/15 [id number]  . carvedilol (COREG) tablet 25 mg  25 mg Oral BID Oralia Manisavid Willis, MD   25 mg at 09/15/15 [id number]  . citalopram (CELEXA) tablet 40 mg  40 mg Oral Daily Oralia Manisavid Willis, MD   40 mg at 09/15/15 [id number]  . heparin injection 5,000 Units  5,000 Units Subcutaneous 3 times per day Oralia Manisavid Willis, MD   5,000 Units at 09/15/15 1350  . HYDROmorphone (DILAUDID) tablet 2 mg  2 mg Oral Q12H PRN Oralia Manisavid Willis, MD   2 mg at 09/15/15 1150  . levothyroxine (SYNTHROID, LEVOTHROID) tablet 75 mcg  75 mcg Oral QAC breakfast Oralia Manisavid Willis, MD   [id number]  mcg at 09/15/15 0839  . lisinopril (PRINIVIL,ZESTRIL) tablet 5 mg  5 mg Oral BID Katharina Caper, MD      . ondansetron (ZOFRAN) tablet 4 mg  4 mg Oral Q6H PRN Oralia Manis, MD       Or  . ondansetron [facility]) injection 4 mg  4 mg Intravenous Q6H PRN Oralia Manis, MD      . oxyCODONE (Oxy IR/ROXICODONE) immediate release tablet 5 mg  5 mg Oral Q4H PRN Katharina Caper, MD      . pantoprazole (PROTONIX) EC tablet 40 mg  40 mg Oral Daily Oralia Manis, MD   40 mg at 09/15/15 0839  . pregabalin (LYRICA) capsule 25 mg  25 mg Oral BID Oralia Manis, MD   25 mg at 09/15/15 0839  . simvastatin (ZOCOR) tablet 20 mg  20 mg Oral QHS  Oralia Manis, MD   20 mg at 09/14/15 2309  . zolpidem (AMBIEN) tablet 5 mg  5 mg Oral QHS PRN Oralia Manis, MD   5 mg at 09/14/15 2309   Do not use this list as official medication orders. Please verify with discharge summary.  Discharge Medications:   Medication List    ASK your doctor about these medications        b complex vitamins tablet  Take 1 tablet by mouth daily.     baclofen 10 MG tablet  Commonly known as:  LIORESAL  Take 10 mg by mouth 3 (three) times daily.     Biotin 1000 MCG tablet  Take 1,000 mcg by mouth daily.     busPIRone 15 MG tablet  Commonly known as:  BUSPAR  Take 15 mg by mouth 2 (two) times daily.     carvedilol 25 MG tablet  Commonly known as:  COREG  Take 25 mg by mouth 2 (two) times daily.     chlorthalidone 25 MG tablet  Commonly known as:  HYGROTON  Take 12.5 mg by mouth daily.     citalopram 40 MG tablet  Commonly known as:  CELEXA  Take 40 mg by mouth daily.     ferrous fumarate-iron polysaccharide complex 162-115.2 MG Caps capsule  Commonly known as:  TANDEM  Take 1 capsule by mouth daily.     HYDROcodone-acetaminophen 10-325 MG tablet  Commonly known as:  NORCO  Take 1 tablet by mouth every 6 (six) hours as needed for moderate pain.     HYDROmorphone 2 MG tablet  Commonly known as:  DILAUDID  Take 1 tablet (2 mg total) by mouth every 12 (twelve) hours as needed for severe pain.     levothyroxine 75 MCG tablet  Commonly known as:  SYNTHROID, LEVOTHROID  Take 75 mcg by mouth daily.     loratadine 10 MG tablet  Commonly known as:  CLARITIN  Take 10 mg by mouth daily.     pantoprazole 40 MG tablet  Commonly known as:  PROTONIX  Take 40 mg by mouth daily.     pregabalin 25 MG capsule  Commonly known as:  LYRICA  Take 25 mg by mouth 2 (two) times daily.     simvastatin 20 MG tablet  Commonly known as:  ZOCOR  Take 20 mg by mouth at bedtime.     Vitamin D-3 1000 UNITS Caps  Take 1,000 Units by mouth daily.      zolpidem 5 MG tablet  Commonly known as:  AMBIEN  Take 5 mg by mouth at bedtime as needed for sleep.  Relevant Imaging Results:  Relevant Lab Results:  Recent Labs    Additional Information  (Cleanse left dorsal foot blister with NS and pat dry. Apply Xeroform gause to blister. Cover with 4x4 gauze, kerlix. Replace left lower extremity splint to posterior leg and foot and secure with ace wrap. Change Mon/Wed/ Fri. SSN: [id number])  Haig Prophet, LCSW

## 2015-09-15 NOTE — Progress Notes (Signed)
OT Cancellation Note  Patient Details Name: Tera PartridgeBrenda G Mellinger MRN: [id number] DOB: 02/19/1941   Cancelled Treatment:      Attempted to see patient.  Patient does not have a weight bearing status at this point but has a consult for podiatry for a previous fracture.  Will await results of consult before getting patient up.   Amy T Lovett, OTR/L, CLT Lovett,Amy 09/15/2015, 3:57 PM

## 2015-09-15 NOTE — Care Management Note (Signed)
Case Management Note  Patient Details  Name: Misty Medina MRN: [id number] Date of Birth: 01-13-1941  Subjective/Objective:                  Met with patient to discuss discharge planning. She was independent with mobility prior to a fall "in her bath tub" resulting in a lower extremity injury. She states she lives with her grandson. She states that she is also open to [facility] and they are aware of this admission. She agrees to rehab although she is currently Medicare OBS but fortunately she has Medicaid that may assist with long-term placement. She has been to ALF at [facility] and would like to return if they can handle her mobility status and if they have a bed available. She does not want to go out of the county for SNF. Podiatry consult pending,   Action/Plan:  RNCM will continue to follow.   Expected Discharge Date:                  Expected Discharge Plan:     In-House Referral:  Clinical Social Work  Discharge planning Services  CM Consult  Post Acute Care Choice:  Home Health Choice offered to:  Patient  DME Arranged:    DME Agency:     HH Arranged:    [place] Agency:  [facility]  Status of Service:  In process, will continue to follow  Medicare Important Message Given:    Date Medicare IM Given:    Medicare IM give by:    Date Additional Medicare IM Given:    Additional Medicare Important Message give by:     If discussed at [place] of Stay Meetings, dates discussed:    Additional Comments:  Marshell Garfinkel, RN 09/15/2015, 11:16 AM

## 2015-09-15 NOTE — Progress Notes (Signed)
*  PRELIMINARY RESULTS* Echocardiogram 2D Echocardiogram has been performed.  Misty Medina 09/15/2015, 9:16 AM

## 2015-09-15 NOTE — Care Management Obs Status (Signed)
MEDICARE OBSERVATION STATUS NOTIFICATION   Patient Details  Name: Misty Medina MRN: [id number] Date of Birth: 04/13/1941   Medicare Observation Status Notification Given:  Yes Code 44 / Obsv letter to patient in Room 157. Berna Bueheryl Aryella Besecker, RN 09/15/2015, 8:20 AM

## 2015-09-15 NOTE — Progress Notes (Signed)
PT Cancellation Note  Patient Details Name: Misty PartridgeBrenda G Medina MRN: [id number] DOB: 04/19/1941   Cancelled Treatment:    Reason Eval/Treat Not Completed: Medical issues which prohibited therapy (Chart reviewed for planned treatment session.  Patient noted with recent L phalanx fracture, placed in L posterior splint/wrap.  Discussed benefit of ortho consult with primary RN to determine appropriate WBing status and possible alternative footwear to allow for improved mobility in home environment.   RN to review with team and place consult as appropriate.  Will continue to follow and initiate as clarified.)   Ashleyanne Hemmingway H. Manson PasseyBrown, PT, DPT, NCS 09/15/2015, 9:26 AM [phone]

## 2015-09-16 DIAGNOSIS — S90829A Blister (nonthermal), unspecified foot, initial encounter: Secondary | ICD-10-CM

## 2015-09-16 DIAGNOSIS — R52 Pain, unspecified: Secondary | ICD-10-CM | POA: Diagnosis not present

## 2015-09-16 DIAGNOSIS — S92502A Displaced unspecified fracture of left lesser toe(s), initial encounter for closed fracture: Secondary | ICD-10-CM

## 2015-09-16 DIAGNOSIS — K5900 Constipation, unspecified: Secondary | ICD-10-CM

## 2015-09-16 LAB — BASIC METABOLIC PANEL
ANION GAP: 9 (ref 5–15)
BUN: 26 mg/dL — ABNORMAL HIGH (ref 6–20)
CHLORIDE: 110 mmol/L (ref 101–111)
CO2: 23 mmol/L (ref 22–32)
Calcium: 8.4 mg/dL — ABNORMAL LOW (ref 8.9–10.3)
Creatinine, Ser: 1.17 mg/dL — ABNORMAL HIGH (ref 0.44–1.00)
GFR calc Af Amer: 52 mL/min — ABNORMAL LOW (ref >60)
GFR calc non Af Amer: 45 mL/min — ABNORMAL LOW (ref >60)
GLUCOSE: 98 mg/dL (ref 65–99)
POTASSIUM: 3.8 mmol/L (ref 3.5–5.1)
Sodium: 142 mmol/L (ref 135–145)

## 2015-09-16 MED ORDER — CHLORTHALIDONE 25 MG PO TABS: 12.5000 mg | ORAL_TABLET | ORAL | Status: AC

## 2015-09-16 MED ORDER — ZOLPIDEM TARTRATE 5 MG PO TABS: 2.5000 mg | ORAL_TABLET | Freq: Every evening | ORAL | Status: DC | PRN

## 2015-09-16 MED ORDER — LISINOPRIL 5 MG PO TABS: 5.0000 mg | ORAL_TABLET | Freq: Two times a day (BID) | ORAL | Status: DC

## 2015-09-16 MED ORDER — CARVEDILOL 25 MG PO TABS: 50.0000 mg | ORAL_TABLET | Freq: Two times a day (BID) | ORAL | Status: DC

## 2015-09-16 MED ORDER — ALPRAZOLAM 0.5 MG PO TABS
0.5000 mg | ORAL_TABLET | ORAL | Status: DC | PRN
  Administered 2015-09-16 (×2): 0.5 mg via ORAL
  Filled 2015-09-16 (×2): qty 1

## 2015-09-16 NOTE — Clinical Social Work Placement (Signed)
   CLINICAL SOCIAL WORK PLACEMENT  NOTE  Date:  09/16/2015  Patient Details  Name: Misty Medina MRN: [id number] Date of Birth: 07/26/1941  Clinical Social Work is seeking post-discharge placement for this patient at the Skilled  Nursing Facility level of care (*CSW will initial, date and re-position this form in  chart as items are completed):  Yes   Patient/family provided with [place] Clinical Social Work Department's list of facilities offering this level of care within the geographic area requested by the patient (or if unable, by the patient's family).  Yes   Patient/family informed of their freedom to choose among providers that offer the needed level of care, that participate in Medicare, Medicaid or managed care program needed by the patient, have an available bed and are willing to accept the patient.  Yes   Patient/family informed of [facility]'s ownership interest in [facility] and [facility], as well as of the fact that they are under no obligation to receive care at these facilities.  PASRR submitted to EDS on 09/15/15     PASRR number received on 09/16/15     Existing PASRR number confirmed on       FL2 transmitted to all facilities in geographic area requested by pt/family on 09/15/15     FL2 transmitted to all facilities within larger geographic area on       Patient informed that his/her managed care company has contracts with or will negotiate with certain facilities, including the following:        Yes   Patient/family informed of bed offers received.  Patient chooses bed at  [facility] )     Physician recommends and patient chooses bed at      Patient to be transferred to  [facility] ) on 09/16/15.  Patient to be transferred to facility by  [facility] )     Patient family notified on 09/16/15 of transfer.  Name of family member notified:   (Patient's grandson Jill AlexandersJustin is aware of D/C today. )      PHYSICIAN       Additional Comment:    _______________________________________________ Haig ProphetMorgan, Lynton Crescenzo G, LCSW 09/16/2015, 3:03 PM

## 2015-09-16 NOTE — Evaluation (Signed)
Physical Therapy Evaluation Patient Details Name: Misty PartridgeBrenda G Medina MRN: [id number] DOB: 10/05/1941 Today's Date: 09/16/2015   History of Present Illness  presented to ER secondary to SOB, multiple falls in home environment (in which she sustained a L 5th metatarsal fracture).  Has had progressive difficulty managing in home environment and began to notice a fluid-filled blister or dorsal aspect of L foot.  Returned to hospital for further evaluation.  Clinical Impression  Upon evaluation, patient alert and oriented to all information; very fearful of mobility attempts, but willing to try as able.  Demonstrates strength and ROM grossly WFL for all extremities (generally deconditioned) for basic transfers and mobility, except lacking bilat ankle DF.  Reports persistent pain across chest from previous fall; L foot pain appears improved with use of offloading shoe during mobility efforts.  Currently requiring mod assist for bed mobility; mod assist +2 for sit/stand, bed/chair and short-distance gait (5-10') with RW.  Poor balance reactions; high fall risk. Unsafe to attempt without +2 at this time. Did discuss position of L LE posterior splint with nursing.  Currently splinted in PF position which impairs patient balance in upright; may benefit from resplinting to more neutral position (if patient has ROM available) to maximize balance.  RN to discuss with MD as able. Would benefit from skilled PT to address above deficits and promote optimal return to PLOF; recommend transition to STR upon discharge from acute hospitalization.     Follow Up Recommendations SNF    Equipment Recommendations       Recommendations for Other Services       Precautions / Restrictions Precautions Precautions: Fall Restrictions Weight Bearing Restrictions: Yes LLE Weight Bearing: Weight bearing as tolerated Other Position/Activity Restrictions: offloading shoe to L foot with all WBing/mobility      Mobility  Bed  Mobility Overal bed mobility: Needs Assistance Bed Mobility: Supine to Sit     Supine to sit: Mod assist        Transfers Overall transfer level: Needs assistance Equipment used: Rolling walker (2 wheeled) Transfers: Sit to/from Stand Sit to Stand: Mod assist;+2 physical assistance Stand pivot transfers: +2 safety/equipment;Min assist       General transfer comment: Assist for R LE foot placement and stabilization (tends to slide forward with exertion); assist for forward weight shift, lift off and dynamic balance  Ambulation/Gait Ambulation/Gait assistance: Mod assist;+2 physical assistance Ambulation Distance (Feet): 5 Feet Assistive device: Rolling walker (2 wheeled)       General Gait Details: step to gait pattern with cuing for walker management/gait sequencing.  Decreaed stance time/weight acceptance to L LE, but pain control appears to be improved with use of offloading shoe.  Stairs            Wheelchair Mobility    Modified Rankin (Stroke Patients Only)       Balance Overall balance assessment: Needs assistance Sitting-balance support: No upper extremity supported;Feet supported Sitting balance-Leahy Scale: Fair Sitting balance - Comments: frequent posterior lean with initial transition to upright, accommodates to position and able to maintain with close sup     Standing balance-Leahy Scale: Poor                               Pertinent Vitals/Pain Pain Assessment: 0-10 Pain Score: 5  Pain Location: chest Pain Descriptors / Indicators: Aching Pain Intervention(s): Limited activity within patient's tolerance;Monitored during session;Repositioned    Home Living Family/patient expects to be  discharged to:: Skilled nursing facility Living Arrangements:  (grandson, works outside of [employer]) Available Help at Discharge: Family Type of Home: Mobile home Home Access: Stairs to enter Entrance Stairs-Rails: Right Entrance Stairs-Number of  Steps: 5 Home Layout: One level Home Equipment: Bedside commode;Walker - 2 wheels;Shower seat;Hand held shower head;Adaptive equipment      Prior Function Level of Independence: Independent         Comments: Indep with household mobility and ADLs at baseline, but has been using RW since recent falls (previous week)     Hand Dominance   Dominant Hand: Right    Extremity/Trunk Assessment   Upper Extremity Assessment: Generalized weakness           Lower Extremity Assessment: Generalized weakness (strength grossly 3+/5 throughout bilat LEs (not tested with resistance secondary to pain).  ROM grossly WFL except L ankle lacking approx 5-8 degrees of DF, R ankle with posterior splint in approx 15-20 degrees of PF)         Communication   Communication: No difficulties  Cognition Arousal/Alertness: Awake/alert Behavior During Therapy: WFL for tasks assessed/performed Overall Cognitive Status: Within Functional Limits for tasks assessed                      General Comments      Exercises Other Exercises Other Exercises: 80' with RW, forward/backward, mod assist +2 for safety--sequencing improved with distance; frequent posterior LOB due to limited bilat ankle DF.  Patient very fearful, poor dynamic balance; unsafe to attempt without +2 at this time.      Assessment/Plan    PT Assessment Patient needs continued PT services  PT Diagnosis Difficulty walking;Generalized weakness   PT Problem List Decreased strength;Decreased range of motion;Decreased activity tolerance;Decreased balance;Decreased mobility;Decreased knowledge of use of DME;Decreased safety awareness;Decreased knowledge of precautions;Cardiopulmonary status limiting activity;Obesity  PT Treatment Interventions DME instruction;Gait training;Stair training;Functional mobility training;Therapeutic activities;Therapeutic exercise;Balance training;Patient/family education   PT Goals (Current goals can be  found in the Care Plan section) Acute Rehab PT Goals Patient Stated Goal: "to get stronger and be able to move around better" PT Goal Formulation: With patient/family Time For Goal Achievement: 09/30/15 Potential to Achieve Goals: Good    Frequency Min 2X/week   Barriers to discharge Decreased caregiver support;Inaccessible home environment      Co-evaluation               End of Session Equipment Utilized During Treatment: Gait belt Activity Tolerance: Patient tolerated treatment well Patient left: in chair;with call bell/phone within reach (alarm pad in place; box not available.  RN informed/aware of patient position; patient voiced understanding of need for assist with transfsers, not to get up by herself) Nurse Communication: Mobility status (need for +2)    Functional Assessment Tool Used: clinical judgment Functional Limitation: Mobility: Walking and moving around Mobility: Walking and Moving Around Current Status (Z6109): At least 60 percent but less than 80 percent impaired, limited or restricted Mobility: Walking and Moving Around Goal Status [phone]): At least 1 percent but less than 20 percent impaired, limited or restricted    Time: 0848-0906 PT Time Calculation (min) (ACUTE ONLY): 18 min   Charges:   PT Evaluation $Initial PT Evaluation Tier I: 1 Procedure PT Treatments $Gait Training: 8-22 mins   PT G Codes:   PT G-Codes **NOT FOR INPATIENT CLASS** Functional Assessment Tool Used: clinical judgment Functional Limitation: Mobility: Walking and moving around Mobility: Walking and Moving Around Current Status (U9811): At least  60 percent but less than 80 percent impaired, limited or restricted Mobility: Walking and Moving Around Goal Status [phone]): At least 1 percent but less than 20 percent impaired, limited or restricted    Gerod Caligiuri H. Manson Passey, PT, DPT, NCS 09/16/2015, 10:08 AM [phone]

## 2015-09-16 NOTE — Progress Notes (Addendum)
Pt to be d/c'd this evening. [place] [facility]. EMS to transport patient via ambulance. [facility] [place], [place] [phone]. EMS transported pt at 6:40pm this evening.

## 2015-09-16 NOTE — Progress Notes (Signed)
Jorje Guild RN liaison for [facility] offered patient a Medicaid bed in [place]. There are no other offers except for [facility], which required an LOG. [place] did not require an LOG. Clinical Social Worker (CSW) met with patient and her grandson Larkin Ina was at bedside. CSW made them aware of above. Patient and Larkin Ina are agreeable to [facility]. CSW made patient aware that she will have to stay for 30 days and turn over for social security check to facility. Patient verbalized her understanding. CSW explained that patient will be responsible for the bill if she leaves the facility before 30 days.   Patient is medically stable for D/C today. Per Jorje Guild patient is going to room 226-B. RN will call report and arrange EMS for transport. Clinical Education officer, museum (CSW) sent D/C Summary and D/C packet via [facility]. EMS form on chart. Patient is aware of above. CSW left patient's grandson Larkin Ina a message making him aware of above. Please reconsult if future social work needs arise. CSW signing off.   Blima Rich, [place] [phone]

## 2015-09-16 NOTE — Evaluation (Signed)
Occupational Therapy Evaluation Patient Details Name: Misty PartridgeBrenda G Medina MRN: [id number] DOB: 07/06/1941 Today's Date: 09/16/2015    History of Present Illness presented to ER secondary to SOB, multiple falls in home environment (in which she sustained a L 5th metatarsal fracture).  Has had progressive difficulty managing in home environment and began to notice a fluid-filled blister or dorsal aspect of L foot.  Returned to hospital for further evaluation.   Clinical Impression   Patient is a 74 yo female who [facility] after having progressive difficulty with mobility and self care tasks following a left foot fracture last week.  She lives with her grandson in a mobile home, was previously independent with all self care tasks, homemaking and functional mobility.  Patient now presents with muscle weakness, decreased self care tasks, decreased ability to transfer, decreased functional mobility and unable to perform homemaking skills. She would benefit from skilled OT to maximize her safety and independence in daily tasks.  She would likely benefit from short term rehab prior to returning home.    Follow Up Recommendations  SNF    Equipment Recommendations       Recommendations for Other Services       Precautions / Restrictions Precautions Precautions: Fall Restrictions Weight Bearing Restrictions: Yes LLE Weight Bearing: Weight bearing as tolerated Other Position/Activity Restrictions: offloading shoe to L foot with all WBing/mobility      Mobility Bed Mobility                  Transfers Overall transfer level: Needs assistance Equipment used: Rolling walker (2 wheeled) Transfers: Sit to/from Stand;Stand Pivot Transfers Sit to Stand: +2 safety/equipment;Mod assist Stand pivot transfers: +2 safety/equipment;Min assist       General transfer comment: Patient requires blocking of right foot, cues to shift weight forwards, left foot in boot.    Balance  Overall balance assessment: Needs assistance Sitting-balance support: Bilateral upper extremity supported Sitting balance-Leahy Scale: Poor                                      ADL Overall ADL's : Needs assistance/impaired Eating/Feeding: Independent   Grooming: Set up   Upper Body Bathing: Moderate assistance   Lower Body Bathing: Maximal assistance   Upper Body Dressing : Minimal assistance   Lower Body Dressing: Maximal assistance   Toilet Transfer: +2 for safety/equipment;Moderate assistance   Toileting- Clothing Manipulation and Hygiene: Maximal assistance       Functional mobility during ADLs: +2 for physical assistance;Minimal assistance       Vision     Perception     Praxis      Pertinent Vitals/Pain Pain Assessment: 0-10 Pain Score: 5  Pain Location: chest Pain Descriptors / Indicators: Aching Pain Intervention(s): Limited activity within patient's tolerance;Monitored during session     Hand Dominance Right   Extremity/Trunk Assessment Upper Extremity Assessment Upper Extremity Assessment: Generalized weakness (RUE 4-/5 overall, LUE 4/5 overall)   Lower Extremity Assessment Lower Extremity Assessment: Defer to PT evaluation       Communication Communication Communication: No difficulties   Cognition Arousal/Alertness: Awake/alert Behavior During Therapy: WFL for tasks assessed/performed Overall Cognitive Status: Within Functional Limits for tasks assessed                     General Comments       Exercises  Shoulder Instructions      Home Living Family/patient expects to be discharged to:: Private residence Living Arrangements: Children Available Help at Discharge: Family Type of Home: Mobile home Home Access: Stairs to enter Entrance Stairs-Number of Steps: 5 Entrance Stairs-Rails: Right Home Layout: One level     Bathroom Shower/Tub: Walk-in shower;Door   Bathroom Toilet: Handicapped  height Bathroom Accessibility: Yes   Home Equipment: Bedside commode;Walker - 2 wheels;Shower seat;Hand held shower head;Adaptive equipment Adaptive Equipment: Reacher        Prior Functioning/Environment Level of Independence: Independent             OT Diagnosis: Generalized weakness;Acute pain;Other (comment) (decreased ability to perform self care tasks.)   OT Problem List: Decreased strength;Impaired balance (sitting and/or standing);Pain;Decreased range of motion;Decreased knowledge of use of DME or AE   OT Treatment/Interventions: Self-care/ADL training;Therapeutic exercise;Patient/family education;Balance training;DME and/or AE instruction    OT Goals(Current goals can be found in the care plan section) Acute Rehab OT Goals Patient Stated Goal: Patient states she wants to be able to take care of herself again and return to driving OT Goal Formulation: With patient/family Time For Goal Achievement: 09/25/15 Potential to Achieve Goals: Good  OT Frequency: Min 1X/week   Barriers to D/C:            Co-evaluation              End of Session Equipment Utilized During Treatment: Gait belt  Activity Tolerance: Patient tolerated treatment well Patient left: in chair;with call bell/phone within reach;Other (comment);with family/visitor present (chair pad in place, alarm box not available, nursing aware, grandson in with patient and patient demos understanding to call for assistance if needing to get up .)   Time: 0910-0934 OT Time Calculation (min): 24 min Charges:  OT General Charges $OT Visit: 1 Procedure OT Evaluation $Initial OT Evaluation Tier I: 1 Procedure OT Treatments $Self Care/Home Management : 8-22 mins G-Codes: OT G-codes **NOT FOR INPATIENT CLASS** Functional Assessment Tool Used: clinical judgment, ADL assessment Functional Limitation: Self care Self Care Current Status (W0981): At least 60 percent but less than 80 percent impaired, limited or  restricted Self Care Goal Status (X9147): At least 20 percent but less than 40 percent impaired, limited or restricted  [employer], OTR/L, CLT 09/16/2015, 9:49 AM

## 2015-09-16 NOTE — Discharge Summary (Signed)
[facility] [place] [facility]   PATIENT NAME: Misty Medina    MR#:  [id number]  DATE OF BIRTH:  05-01-1941  DATE OF ADMISSION:  09/14/2015 ADMITTING PHYSICIAN: Oralia Manis, MD  DATE OF DISCHARGE: No discharge date for patient encounter.  PRIMARY CARE PHYSICIAN: Luna Fuse, MD     ADMISSION DIAGNOSIS:  Bulla [L98.8] Intractable pain [R52]  DISCHARGE DIAGNOSIS:  Principal Problem:   Frequent falls Active Problems:   Foot fracture, left   Falls   Fracture of fifth toe, left, closed   HTN (hypertension)   Blister of foot without infection   Hypothyroidism   Chronic systolic CHF (congestive heart failure) (HCC)   GERD (gastroesophageal reflux disease)   Depression   Constipation   SECONDARY DIAGNOSIS:   Past Medical History  Diagnosis Date  . Fibromyalgia   . Hypertension   . Heart murmur   . Hypothyroidism   . GERD (gastroesophageal reflux disease)   . Depression   . Osteoporosis     .pro HOSPITAL COURSE:  Patient is 74 year old Caucasian female with past medical history significant for history of recent left foot fifth toe fracture who presents to the hospital with frequent falls. On arrival to emergency room, she was noted to have large blister on the dose aspect of the foot, as patient was not able to walk because of the pain and required multiple doses of pain medications. She was admitted to the hospital for further evaluation and treatment. On admission to emergency room, patient's creatinine was noted to be elevated to 2.42 which is significantly higher than her baseline around 1.3. Patient was hydrated, higher diuretic and her kidney function improved to the  baseline 1.43. She was seen by podiatrist, Dr. Alberteen Spindle, rheumatology patient was not able to put significant way on the left foot. In regards to left forefoot blister, he excisionally the pruritus superficially of the skin from the drain blister to the dorsum of the  left foot. Xeroform and sterile bandage was reapplied followed by the splint previously applied to the left foot. Patient had an x-ray of the left foot repeated and it showed that no additional fractures. Podiatrist felt that patient would need to have local wound care with dressing changes every couple of days due to inability to bear weight. He recommended evaluation by physical therapy, although he felt that it might be easier for her and surgical shoe which was ordered for her upon discharge. She was evaluated by physical as well as occupational therapist who recommended skilled nursing facility placement for rehabilitation. Patient will be discharged to skilled nursing facility today. 17th of November 2016 Discussion by problem 1. Left foot fifths toe fracture, continue surgical shoe with pain medications, patient is going to be discharged to rehabilitation facility. Appreciate podiatrist input 2. Left forefoot blister, status post excisional  Debridement, patient's dressings with Xeroform and sterile bandage should be changed every few days, wound care should follow patient along, patient should follow up with Dr. Alberteen Spindle as well for further recommendations 3Constipation, patient did have bowel movement yesterday and felt much more comfortable today. No abdominal pain 4 Chronic systolic CHF, stable. Patient is to continue home medications, although her kidney function was found to be abnormal on admission to the hospital, signifying some intravascular depletion. For this reason, we are going to change diuretic to every second day, advancing Coreg to 50 mg twice daily dose to better control blood pressure and heart rate, patient is to continue ACE  inhibitor, watching blood pressure readings as well as kidney function closely. Oxygenation satisfactory.  5 Malignant essential hypertension, advance Coreg to 50 mg twice daily dose, continue low-dose of lisinopril, watching kidney function closely 6 Acute on  chronic renal failure, much improved holding diuretic, follow labs with resumption of diuretic and ACE inhibitor use.  DISCHARGE CONDITIONS:   stable  CONSULTS OBTAINED:  Treatment Team:  Linus Galas, MD  DRUG ALLERGIES:   Allergies  Allergen Reactions  . Codeine Nausea And Vomiting  . Penicillins Other (See Comments)    Reaction:  Unknown     DISCHARGE MEDICATIONS:   Current Discharge Medication List    START taking these medications   Details  lisinopril (PRINIVIL,ZESTRIL) 5 MG tablet Take 1 tablet (5 mg total) by mouth 2 (two) times daily. Qty: 60 tablet, Refills: 6      CONTINUE these medications which have CHANGED   Details  carvedilol (COREG) 25 MG tablet Take 2 tablets (50 mg total) by mouth 2 (two) times daily. Qty: 60 tablet, Refills: 6    chlorthalidone (HYGROTON) 25 MG tablet Take 0.5 tablets (12.5 mg total) by mouth every other day. Qty: 30 tablet, Refills: 6    zolpidem (AMBIEN) 5 MG tablet Take 0.5 tablets (2.5 mg total) by mouth at bedtime as needed for sleep. Qty: 30 tablet, Refills: 0      CONTINUE these medications which have NOT CHANGED   Details  b complex vitamins tablet Take 1 tablet by mouth daily.    baclofen (LIORESAL) 10 MG tablet Take 10 mg by mouth 3 (three) times daily.    Biotin 1000 MCG tablet Take 1,000 mcg by mouth daily.    busPIRone (BUSPAR) 15 MG tablet Take 15 mg by mouth 2 (two) times daily.    Cholecalciferol (VITAMIN D-3) 1000 UNITS CAPS Take 1,000 Units by mouth daily.    citalopram (CELEXA) 40 MG tablet Take 40 mg by mouth daily.    ferrous fumarate-iron polysaccharide complex (TANDEM) 162-115.2 MG CAPS capsule Take 1 capsule by mouth daily.    HYDROcodone-acetaminophen (NORCO) 10-325 MG tablet Take 1 tablet by mouth every 6 (six) hours as needed for moderate pain.    levothyroxine (SYNTHROID, LEVOTHROID) 75 MCG tablet Take 75 mcg by mouth daily.    loratadine (CLARITIN) 10 MG tablet Take 10 mg by mouth daily.     pantoprazole (PROTONIX) 40 MG tablet Take 40 mg by mouth daily.    pregabalin (LYRICA) 25 MG capsule Take 25 mg by mouth 2 (two) times daily.    simvastatin (ZOCOR) 20 MG tablet Take 20 mg by mouth at bedtime.      STOP taking these medications     HYDROmorphone (DILAUDID) 2 MG tablet          DISCHARGE INSTRUCTIONS:    Patient is to follow-up with her primary care physician and podiatrist, she is to follow up with wound care clinic as outpatient  If you experience worsening of your admission symptoms, develop shortness of breath, life threatening emergency, suicidal or homicidal thoughts you must seek medical attention immediately by calling 911 or calling your MD immediately  if symptoms less severe.  You Must read complete instructions/literature along with all the possible adverse reactions/side effects for all the Medicines you take and that have been prescribed to you. Take any new Medicines after you have completely understood and accept all the possible adverse reactions/side effects.   Please note  You were cared for by a hospitalist during your  hospital stay. If you have any questions about your discharge medications or the care you received while you were in the hospital after you are discharged, you can call the unit and asked to speak with the hospitalist on call if the hospitalist that took care of you is not available. Once you are discharged, your primary care physician will handle any further medical issues. Please note that NO REFILLS for any discharge medications will be authorized once you are discharged, as it is imperative that you return to your primary care physician (or establish a relationship with a primary care physician if you do not have one) for your aftercare needs so that they can reassess your need for medications and monitor your lab values.    Today   CHIEF COMPLAINT:   Chief Complaint  Patient presents with  . Sore    HISTORY OF PRESENT  ILLNESS:  Misty Medina  is a 74 y.o. female with a known history of  history of recent left foot fifth toe fracture who presents to the hospital with frequent falls. On arrival to emergency room, she was noted to have large blister on the dose aspect of the foot, as patient was not able to walk because of the pain and required multiple doses of pain medications. She was admitted to the hospital for further evaluation and treatment. On admission to emergency room, patient's creatinine was noted to be elevated to 2.42 which is significantly higher than her baseline around 1.3. Patient was hydrated, higher diuretic and her kidney function improved to the  baseline 1.43. She was seen by podiatrist, Dr. Alberteen Spindle, rheumatology patient was not able to put significant way on the left foot. In regards to left forefoot blister, he excisionally the pruritus superficially of the skin from the drain blister to the dorsum of the left foot. Xeroform and sterile bandage was reapplied followed by the splint previously applied to the left foot. Patient had an x-ray of the left foot repeated and it showed that no additional fractures. Podiatrist felt that patient would need to have local wound care with dressing changes every couple of days due to inability to bear weight. He recommended evaluation by physical therapy, although he felt that it might be easier for her and surgical shoe which was ordered for her upon discharge. She was evaluated by physical as well as occupational therapist who recommended skilled nursing facility placement for rehabilitation. Patient will be discharged to skilled nursing facility today. 17th of November 2016 Discussion by problem 1. Left foot fifths toe fracture, continue surgical shoe with pain medications, patient is going to be discharged to rehabilitation facility. Appreciate podiatrist input 2. Left forefoot blister, status post excisional  Debridement, patient's dressings with Xeroform and  sterile bandage should be changed every few days, wound care should follow patient along, patient should follow up with Dr. Alberteen Spindle as well for further recommendations 3Constipation, patient did have bowel movement yesterday and felt much more comfortable today. No abdominal pain 4 Chronic systolic CHF, stable. Patient is to continue home medications, although her kidney function was found to be abnormal on admission to the hospital, signifying some intravascular depletion. For this reason, we are going to change diuretic to every second day, advancing Coreg to 50 mg twice daily dose to better control blood pressure and heart rate, patient is to continue ACE inhibitor, watching blood pressure readings as well as kidney function closely. Oxygenation satisfactory.  5 Malignant essential hypertension, advance Coreg to 50 mg twice daily  dose, continue low-dose of lisinopril, watching kidney function closely 6 Acute on chronic renal failure, much improved holding diuretic, follow labs with resumption of diuretic and ACE inhibitor use.     VITAL SIGNS:  Blood pressure 149/73, pulse 83, temperature 98.3 F (36.8 C), temperature source Oral, resp. rate 18, height 5\' 4"  (1.626 m), weight 105.325 kg (232 lb 3.2 oz), SpO2 94 %.  I/O:   Intake/Output Summary (Last 24 hours) at 09/16/15 1248 Last data filed at 09/15/15 1900  Gross per 24 hour  Intake    240 ml  Output      0 ml  Net    240 ml    PHYSICAL EXAMINATION:  GENERAL:  74 y.o.-year-old patient lying in the bed with no acute distress.  EYES: Pupils equal, round, reactive to light and accommodation. No scleral icterus. Extraocular muscles intact.  HEENT: Head atraumatic, normocephalic. Oropharynx and nasopharynx clear.  NECK:  Supple, no jugular venous distention. No thyroid enlargement, no tenderness.  LUNGS: Normal breath sounds bilaterally, no wheezing, rales,rhonchi or crepitation. No use of accessory muscles of respiration.  CARDIOVASCULAR:  S1, S2 normal. No murmurs, rubs, or gallops.  ABDOMEN: Soft, non-tender, non-distended. Bowel sounds present. No organomegaly or mass.  EXTREMITIES: No pedal edema, cyanosis, or clubbing. Left foot is in splint  And wrapped NEUROLOGIC: Cranial nerves II through XII are intact. Muscle strength 5/5 in all extremities. Sensation intact. Gait not checked.  PSYCHIATRIC: The patient is alert and oriented x 3.  SKIN: No obvious rash, lesion, or ulcer.   DATA REVIEW:   CBC  Recent Labs Lab 09/15/15 0715  WBC 9.2  HGB 10.1*  HCT 29.7*  PLT 206    Chemistries   Recent Labs Lab 09/14/15 1304 09/15/15 0715  NA 138 138  K 3.7 4.2  CL 106 106  CO2 23 22  GLUCOSE 97 149*  BUN 34* 32*  CREATININE 1.58* 1.43*  CALCIUM 8.0* 7.9*  AST 40  --   ALT 17  --   ALKPHOS 76  --   BILITOT 1.3*  --     Cardiac Enzymes  Recent Labs Lab 09/14/15 1304  TROPONINI <0.03    Microbiology Results  Results for orders placed or performed in visit on 04/29/14  Clostridium Difficile [facility])     Status: None   Collection Time: 05/05/14 10:00 AM  Result Value Ref Range Status   Micro Text Report   Final       C.DIFFICILE ANTIGEN       C.DIFFICILE GDH ANTIGEN : NEGATIVE   C.DIFFICILE TOXIN A/B     C.DIFFICILE TOXINS A AND B : NEGATIVE   INTERPRETATION            Negative for C. difficile.    ANTIBIOTIC                                                        RADIOLOGY:  Dg Chest 2 View  09/14/2015  CLINICAL DATA:  Shortness of breath, wheezing, poor historian, personal history hypertension EXAM: CHEST  2 VIEW COMPARISON:  09/10/2015 FINDINGS: Enlargement of cardiac silhouette. Mediastinal contours and pulmonary vascularity normal. Atherosclerotic calcification aorta. Question small LEFT pleural effusion. Lungs otherwise clear. No pneumothorax. Diffuse osseous demineralization. IMPRESSION: Enlargement of cardiac silhouette with suspect small LEFT pleural effusion. Little interval  change.  Electronically Signed   By: Ulyses Southward M.D.   On: 09/14/2015 17:43   Dg Foot Complete Left  09/15/2015  CLINICAL DATA:  Persistent swelling and bruising. EXAM: LEFT FOOT - COMPLETE 3+ VIEW COMPARISON:  09/10/2015 FINDINGS: Overlying cast material obscures bony detail. Nondisplaced fracture involves the proximal portion the proximal phalanx of fifth digit, intra-articular. No callus deposition. No new fracture identified. Dorsal forefoot soft tissue swelling is moderate and similar. IMPRESSION: Redemonstration of nondisplaced fracture at the base of the fifth metatarsal. Soft tissue swelling, without acute superimposed process. Electronically Signed   By: Jeronimo Greaves M.D.   On: 09/15/2015 16:38    EKG:   Orders placed or performed during the hospital encounter of 09/14/15  . ED EKG  . ED EKG  . EKG 12-Lead  . EKG 12-Lead      Management plans discussed with the patient, family and they are in agreement.  CODE STATUS:     Code Status Orders        Start     Ordered   09/14/15 2237  Full code   Continuous     09/14/15 2236      TOTAL TIME TAKING CARE OF THIS PATIENT: 40inutes.    Katharina Caper M.D on 09/16/2015 at 12:48 PM  Between 7am to 6pm - Pager - [phone]  After 6pm go to [email or web address] - password [email or web address] [facility]  [place] [facility]  Office  [phone]  CC: Primary care physician; Luna Fuse, MD

## 2015-09-22 ENCOUNTER — Encounter
Admission: RE | Admit: 2015-09-22 | Discharge: 2015-09-22 | Disposition: A | Discharge status: Home / Self Care | Payer: Medicare Other | Source: Ambulatory Visit | Attending: Internal Medicine | Admitting: Internal Medicine

## 2015-09-28 LAB — URINALYSIS COMPLETE WITH MICROSCOPIC (ARMC ONLY)
BILIRUBIN URINE: NEGATIVE
Bacteria, UA: NONE SEEN
GLUCOSE, UA: NEGATIVE mg/dL
Hgb urine dipstick: NEGATIVE
Leukocytes, UA: NEGATIVE
NITRITE: NEGATIVE
PH: 6 (ref 5.0–8.0)
Protein, ur: NEGATIVE mg/dL
Specific Gravity, Urine: 1.013 (ref 1.005–1.030)

## 2015-09-28 LAB — TSH: TSH: 4.996 u[IU]/mL — ABNORMAL HIGH (ref 0.350–4.500)

## 2015-09-28 LAB — CBC WITH DIFFERENTIAL/PLATELET
BASOS PCT: 1 %
Basophils Absolute: 0.2 10*3/uL — ABNORMAL HIGH (ref 0–0.1)
Eosinophils Absolute: 0.1 10*3/uL (ref 0–0.7)
Eosinophils Relative: 1 %
HEMATOCRIT: 35.8 % (ref 35.0–47.0)
HEMOGLOBIN: 11.9 g/dL — AB (ref 12.0–16.0)
LYMPHS PCT: 17 %
Lymphs Abs: 2.9 10*3/uL (ref 1.0–3.6)
MCH: 30.6 pg (ref 26.0–34.0)
MCHC: 33.3 g/dL (ref 32.0–36.0)
MCV: 91.9 fL (ref 80.0–100.0)
MONOS PCT: 5 %
Monocytes Absolute: 0.8 10*3/uL (ref 0.2–0.9)
NEUTROS ABS: 12.9 10*3/uL — AB (ref 1.4–6.5)
NEUTROS PCT: 76 %
Platelets: 479 10*3/uL — ABNORMAL HIGH (ref 150–440)
RBC: 3.89 MIL/uL (ref 3.80–5.20)
RDW: 13.6 % (ref 11.5–14.5)
WBC: 17 10*3/uL — ABNORMAL HIGH (ref 3.6–11.0)

## 2015-09-28 LAB — COMPREHENSIVE METABOLIC PANEL WITH GFR
ALT: 22 U/L (ref 14–54)
AST: 33 U/L (ref 15–41)
Albumin: 3.3 g/dL — ABNORMAL LOW (ref 3.5–5.0)
Alkaline Phosphatase: 130 U/L — ABNORMAL HIGH (ref 38–126)
Anion gap: 19 — ABNORMAL HIGH (ref 5–15)
BUN: 19 mg/dL (ref 6–20)
CO2: 20 mmol/L — ABNORMAL LOW (ref 22–32)
Calcium: 8.5 mg/dL — ABNORMAL LOW (ref 8.9–10.3)
Chloride: 90 mmol/L — ABNORMAL LOW (ref 101–111)
Creatinine, Ser: 2.19 mg/dL — ABNORMAL HIGH (ref 0.44–1.00)
GFR calc Af Amer: 24 mL/min — ABNORMAL LOW
GFR calc non Af Amer: 21 mL/min — ABNORMAL LOW
Glucose, Bld: 81 mg/dL (ref 65–99)
Potassium: 3.8 mmol/L (ref 3.5–5.1)
Sodium: 129 mmol/L — ABNORMAL LOW (ref 135–145)
Total Bilirubin: 0.5 mg/dL (ref 0.3–1.2)
Total Protein: 6.2 g/dL — ABNORMAL LOW (ref 6.5–8.1)

## 2015-09-28 LAB — MAGNESIUM: Magnesium: 1.5 mg/dL — ABNORMAL LOW (ref 1.7–2.4)

## 2015-09-28 LAB — VITAMIN B12: VITAMIN B 12: 2233 pg/mL — AB (ref 180–914)

## 2015-09-29 LAB — VITAMIN D 25 HYDROXY (VIT D DEFICIENCY, FRACTURES): VIT D 25 HYDROXY: 48 ng/mL (ref 30.0–100.0)

## 2015-09-30 ENCOUNTER — Encounter
Admission: RE | Admit: 2015-09-30 | Discharge: 2015-09-30 | Disposition: A | Discharge status: Home / Self Care | Payer: Medicare Other | Source: Ambulatory Visit | Attending: Internal Medicine | Admitting: Internal Medicine

## 2015-09-30 DIAGNOSIS — I959 Hypotension, unspecified: Secondary | ICD-10-CM | POA: Insufficient documentation

## 2015-09-30 DIAGNOSIS — R197 Diarrhea, unspecified: Secondary | ICD-10-CM | POA: Insufficient documentation

## 2015-09-30 DIAGNOSIS — R410 Disorientation, unspecified: Secondary | ICD-10-CM | POA: Insufficient documentation

## 2015-09-30 LAB — URINE CULTURE: Culture: NO GROWTH

## 2015-10-02 LAB — C DIFFICILE QUICK SCREEN W PCR REFLEX
C DIFFICILE (CDIFF) INTERP: NEGATIVE
C DIFFICLE (CDIFF) ANTIGEN: NEGATIVE
C Diff toxin: NEGATIVE

## 2015-10-04 LAB — COMPREHENSIVE METABOLIC PANEL
ALK PHOS: 104 U/L (ref 38–126)
ALT: 14 U/L (ref 14–54)
AST: 18 U/L (ref 15–41)
Albumin: 2.9 g/dL — ABNORMAL LOW (ref 3.5–5.0)
Anion gap: 12 (ref 5–15)
BILIRUBIN TOTAL: 0.5 mg/dL (ref 0.3–1.2)
BUN: 24 mg/dL — AB (ref 6–20)
CHLORIDE: 100 mmol/L — AB (ref 101–111)
CO2: 27 mmol/L (ref 22–32)
Calcium: 8.8 mg/dL — ABNORMAL LOW (ref 8.9–10.3)
Creatinine, Ser: 2.1 mg/dL — ABNORMAL HIGH (ref 0.44–1.00)
GFR, EST AFRICAN AMERICAN: 26 mL/min — AB (ref >60)
GFR, EST NON AFRICAN AMERICAN: 22 mL/min — AB (ref >60)
GLUCOSE: 95 mg/dL (ref 65–99)
POTASSIUM: 3.6 mmol/L (ref 3.5–5.1)
Sodium: 139 mmol/L (ref 135–145)
Total Protein: 5.3 g/dL — ABNORMAL LOW (ref 6.5–8.1)

## 2015-10-04 LAB — CBC WITH DIFFERENTIAL/PLATELET
Basophils Absolute: 0.3 10*3/uL — ABNORMAL HIGH (ref 0–0.1)
Basophils Relative: 3 %
EOS PCT: 6 %
Eosinophils Absolute: 0.5 10*3/uL (ref 0–0.7)
HCT: 34 % — ABNORMAL LOW (ref 35.0–47.0)
HEMOGLOBIN: 11.5 g/dL — AB (ref 12.0–16.0)
LYMPHS ABS: 3.1 10*3/uL (ref 1.0–3.6)
LYMPHS PCT: 35 %
MCH: 31.4 pg (ref 26.0–34.0)
MCHC: 33.9 g/dL (ref 32.0–36.0)
MCV: 92.8 fL (ref 80.0–100.0)
MONOS PCT: 8 %
Monocytes Absolute: 0.7 10*3/uL (ref 0.2–0.9)
NEUTROS PCT: 48 %
Neutro Abs: 4.3 10*3/uL (ref 1.4–6.5)
Platelets: 305 10*3/uL (ref 150–440)
RBC: 3.67 MIL/uL — AB (ref 3.80–5.20)
RDW: 13.4 % (ref 11.5–14.5)
WBC: 8.9 10*3/uL (ref 3.6–11.0)

## 2015-10-06 LAB — OCCULT BLOOD X 1 CARD TO LAB, STOOL: FECAL OCCULT BLD: NEGATIVE

## 2015-11-04 ENCOUNTER — Other Ambulatory Visit: Payer: Self-pay | Admitting: Internal Medicine

## 2015-11-04 DIAGNOSIS — N63 Unspecified lump in unspecified breast: Secondary | ICD-10-CM

## 2015-11-22 ENCOUNTER — Ambulatory Visit
Admission: RE | Admit: 2015-11-22 | Discharge: 2015-11-22 | Disposition: A | Discharge status: Home / Self Care | Payer: Medicare Other | Source: Ambulatory Visit | Attending: Internal Medicine | Admitting: Internal Medicine

## 2015-11-22 DIAGNOSIS — N63 Unspecified lump in unspecified breast: Secondary | ICD-10-CM

## 2016-08-28 ENCOUNTER — Inpatient Hospital Stay
Admission: EM | Admit: 2016-08-28 | Discharge: 2016-09-02 | DRG: 377 | Disposition: A | Discharge status: Home / Self Care | Payer: Medicare Other | Attending: Internal Medicine | Admitting: Internal Medicine

## 2016-08-28 ENCOUNTER — Encounter: Payer: Self-pay | Admitting: Medical Oncology

## 2016-08-28 ENCOUNTER — Emergency Department: Payer: Medicare Other

## 2016-08-28 DIAGNOSIS — N390 Urinary tract infection, site not specified: Secondary | ICD-10-CM | POA: Diagnosis present

## 2016-08-28 DIAGNOSIS — J690 Pneumonitis due to inhalation of food and vomit: Secondary | ICD-10-CM | POA: Diagnosis present

## 2016-08-28 DIAGNOSIS — I5033 Acute on chronic diastolic (congestive) heart failure: Secondary | ICD-10-CM | POA: Diagnosis present

## 2016-08-28 DIAGNOSIS — R0902 Hypoxemia: Secondary | ICD-10-CM | POA: Diagnosis present

## 2016-08-28 DIAGNOSIS — N184 Chronic kidney disease, stage 4 (severe): Secondary | ICD-10-CM | POA: Diagnosis present

## 2016-08-28 DIAGNOSIS — B9620 Unspecified Escherichia coli [E. coli] as the cause of diseases classified elsewhere: Secondary | ICD-10-CM | POA: Diagnosis present

## 2016-08-28 DIAGNOSIS — I509 Heart failure, unspecified: Secondary | ICD-10-CM

## 2016-08-28 DIAGNOSIS — Z880 Allergy status to penicillin: Secondary | ICD-10-CM

## 2016-08-28 DIAGNOSIS — Z885 Allergy status to narcotic agent status: Secondary | ICD-10-CM

## 2016-08-28 DIAGNOSIS — I959 Hypotension, unspecified: Secondary | ICD-10-CM | POA: Diagnosis present

## 2016-08-28 DIAGNOSIS — Z79899 Other long term (current) drug therapy: Secondary | ICD-10-CM

## 2016-08-28 DIAGNOSIS — E039 Hypothyroidism, unspecified: Secondary | ICD-10-CM | POA: Diagnosis present

## 2016-08-28 DIAGNOSIS — K5900 Constipation, unspecified: Secondary | ICD-10-CM | POA: Diagnosis present

## 2016-08-28 DIAGNOSIS — E860 Dehydration: Secondary | ICD-10-CM | POA: Diagnosis present

## 2016-08-28 DIAGNOSIS — M797 Fibromyalgia: Secondary | ICD-10-CM | POA: Diagnosis present

## 2016-08-28 DIAGNOSIS — R262 Difficulty in walking, not elsewhere classified: Secondary | ICD-10-CM

## 2016-08-28 DIAGNOSIS — M810 Age-related osteoporosis without current pathological fracture: Secondary | ICD-10-CM | POA: Diagnosis present

## 2016-08-28 DIAGNOSIS — I130 Hypertensive heart and chronic kidney disease with heart failure and stage 1 through stage 4 chronic kidney disease, or unspecified chronic kidney disease: Secondary | ICD-10-CM | POA: Diagnosis present

## 2016-08-28 DIAGNOSIS — N179 Acute kidney failure, unspecified: Secondary | ICD-10-CM

## 2016-08-28 DIAGNOSIS — D62 Acute posthemorrhagic anemia: Secondary | ICD-10-CM | POA: Diagnosis present

## 2016-08-28 DIAGNOSIS — Z90710 Acquired absence of both cervix and uterus: Secondary | ICD-10-CM

## 2016-08-28 DIAGNOSIS — B9689 Other specified bacterial agents as the cause of diseases classified elsewhere: Secondary | ICD-10-CM | POA: Diagnosis present

## 2016-08-28 DIAGNOSIS — K259 Gastric ulcer, unspecified as acute or chronic, without hemorrhage or perforation: Secondary | ICD-10-CM | POA: Diagnosis present

## 2016-08-28 DIAGNOSIS — K219 Gastro-esophageal reflux disease without esophagitis: Secondary | ICD-10-CM | POA: Diagnosis present

## 2016-08-28 DIAGNOSIS — K449 Diaphragmatic hernia without obstruction or gangrene: Secondary | ICD-10-CM | POA: Diagnosis present

## 2016-08-28 DIAGNOSIS — K922 Gastrointestinal hemorrhage, unspecified: Secondary | ICD-10-CM | POA: Diagnosis present

## 2016-08-28 DIAGNOSIS — K254 Chronic or unspecified gastric ulcer with hemorrhage: Secondary | ICD-10-CM | POA: Diagnosis present

## 2016-08-28 DIAGNOSIS — M6281 Muscle weakness (generalized): Secondary | ICD-10-CM

## 2016-08-28 LAB — CBC WITH DIFFERENTIAL/PLATELET
BASOS ABS: 0.1 10*3/uL (ref 0–0.1)
BASOS PCT: 1 %
EOS ABS: 0.4 10*3/uL (ref 0–0.7)
EOS PCT: 4 %
HCT: 23.5 % — ABNORMAL LOW (ref 35.0–47.0)
Hemoglobin: 7.4 g/dL — ABNORMAL LOW (ref 12.0–16.0)
Lymphocytes Relative: 16 %
Lymphs Abs: 1.4 10*3/uL (ref 1.0–3.6)
MCH: 31.2 pg (ref 26.0–34.0)
MCHC: 31.3 g/dL — ABNORMAL LOW (ref 32.0–36.0)
MCV: 99.9 fL (ref 80.0–100.0)
Monocytes Absolute: 0.4 10*3/uL (ref 0.2–0.9)
Monocytes Relative: 5 %
Neutro Abs: 6.3 10*3/uL (ref 1.4–6.5)
Neutrophils Relative %: 74 %
PLATELETS: 210 10*3/uL (ref 150–440)
RBC: 2.36 MIL/uL — AB (ref 3.80–5.20)
RDW: 13.9 % (ref 11.5–14.5)
WBC: 8.6 10*3/uL (ref 3.6–11.0)

## 2016-08-28 LAB — COMPREHENSIVE METABOLIC PANEL
ALK PHOS: 213 U/L — AB (ref 38–126)
ALT: 88 U/L — AB (ref 14–54)
AST: 132 U/L — ABNORMAL HIGH (ref 15–41)
Albumin: 3.3 g/dL — ABNORMAL LOW (ref 3.5–5.0)
Anion gap: 9 (ref 5–15)
BUN: 42 mg/dL — ABNORMAL HIGH (ref 6–20)
CALCIUM: 9 mg/dL (ref 8.9–10.3)
CHLORIDE: 105 mmol/L (ref 101–111)
CO2: 23 mmol/L (ref 22–32)
CREATININE: 2.5 mg/dL — AB (ref 0.44–1.00)
GFR, EST AFRICAN AMERICAN: 21 mL/min — AB (ref >60)
GFR, EST NON AFRICAN AMERICAN: 18 mL/min — AB (ref >60)
Glucose, Bld: 101 mg/dL — ABNORMAL HIGH (ref 65–99)
Potassium: 4.1 mmol/L (ref 3.5–5.1)
Sodium: 137 mmol/L (ref 135–145)
TOTAL PROTEIN: 6.1 g/dL — AB (ref 6.5–8.1)
Total Bilirubin: 0.9 mg/dL (ref 0.3–1.2)

## 2016-08-28 LAB — URINALYSIS COMPLETE WITH MICROSCOPIC (ARMC ONLY)
Bilirubin Urine: NEGATIVE
Glucose, UA: NEGATIVE mg/dL
Hgb urine dipstick: NEGATIVE
KETONES UR: NEGATIVE mg/dL
Nitrite: POSITIVE — AB
PROTEIN: NEGATIVE mg/dL
SPECIFIC GRAVITY, URINE: 1.017 (ref 1.005–1.030)
pH: 5 (ref 5.0–8.0)

## 2016-08-28 LAB — TROPONIN I

## 2016-08-28 MED ORDER — CITALOPRAM HYDROBROMIDE 20 MG PO TABS
40.0000 mg | ORAL_TABLET | Freq: Every day | ORAL | Status: DC
  Administered 2016-08-29: 40 mg via ORAL
  Filled 2016-08-28: qty 2

## 2016-08-28 MED ORDER — FAMOTIDINE IN NACL 20-0.9 MG/50ML-% IV SOLN
20.0000 mg | Freq: Two times a day (BID) | INTRAVENOUS | Status: DC
  Administered 2016-08-28 – 2016-08-29 (×3): 20 mg via INTRAVENOUS
  Filled 2016-08-28 (×4): qty 50

## 2016-08-28 MED ORDER — BUSPIRONE HCL 5 MG PO TABS
15.0000 mg | ORAL_TABLET | Freq: Two times a day (BID) | ORAL | Status: DC
  Administered 2016-08-28 – 2016-09-02 (×10): 15 mg via ORAL
  Filled 2016-08-28 (×8): qty 3
  Filled 2016-08-28: qty 1
  Filled 2016-08-28 (×2): qty 3

## 2016-08-28 MED ORDER — MAGNESIUM OXIDE 400 (241.3 MG) MG PO TABS
400.0000 mg | ORAL_TABLET | Freq: Every day | ORAL | Status: DC
  Administered 2016-08-29 – 2016-09-02 (×5): 400 mg via ORAL
  Filled 2016-08-28 (×5): qty 1

## 2016-08-28 MED ORDER — ONDANSETRON HCL 4 MG/2ML IJ SOLN
4.0000 mg | Freq: Four times a day (QID) | INTRAMUSCULAR | Status: DC | PRN
  Administered 2016-08-31 – 2016-09-01 (×2): 4 mg via INTRAVENOUS
  Filled 2016-08-28 (×2): qty 2

## 2016-08-28 MED ORDER — LEVOTHYROXINE SODIUM 75 MCG PO TABS
75.0000 ug | ORAL_TABLET | Freq: Every day | ORAL | Status: DC
  Administered 2016-08-29 – 2016-09-02 (×5): 75 ug via ORAL
  Filled 2016-08-28 (×5): qty 1

## 2016-08-28 MED ORDER — ZOLPIDEM TARTRATE 5 MG PO TABS
5.0000 mg | ORAL_TABLET | Freq: Every evening | ORAL | Status: DC | PRN
  Administered 2016-08-28 – 2016-09-01 (×5): 5 mg via ORAL
  Filled 2016-08-28 (×5): qty 1

## 2016-08-28 MED ORDER — HYDROCODONE-ACETAMINOPHEN 10-325 MG PO TABS
1.0000 | ORAL_TABLET | Freq: Four times a day (QID) | ORAL | Status: DC | PRN
  Administered 2016-08-28 – 2016-09-01 (×10): 1 via ORAL
  Filled 2016-08-28 (×10): qty 1

## 2016-08-28 MED ORDER — FE FUMARATE-B12-VIT C-FA-IFC PO CAPS
1.0000 | ORAL_CAPSULE | Freq: Every day | ORAL | Status: DC
  Administered 2016-08-29 – 2016-09-02 (×5): 1 via ORAL
  Filled 2016-08-28 (×5): qty 1

## 2016-08-28 MED ORDER — POTASSIUM CHLORIDE IN NACL 20-0.9 MEQ/L-% IV SOLN
INTRAVENOUS | Status: DC
  Administered 2016-08-28 – 2016-08-29 (×2): via INTRAVENOUS
  Filled 2016-08-28 (×4): qty 1000

## 2016-08-28 MED ORDER — ACETAMINOPHEN 650 MG RE SUPP: 650.0000 mg | Freq: Four times a day (QID) | RECTAL | Status: DC | PRN

## 2016-08-28 MED ORDER — FESOTERODINE FUMARATE ER 4 MG PO TB24
8.0000 mg | ORAL_TABLET | Freq: Every day | ORAL | Status: DC
  Administered 2016-08-29 – 2016-09-02 (×5): 8 mg via ORAL
  Filled 2016-08-28: qty 2
  Filled 2016-08-28: qty 1
  Filled 2016-08-28 (×2): qty 2
  Filled 2016-08-28 (×3): qty 1

## 2016-08-28 MED ORDER — LORATADINE 10 MG PO TABS
10.0000 mg | ORAL_TABLET | Freq: Every day | ORAL | Status: DC
  Administered 2016-08-29 – 2016-09-02 (×5): 10 mg via ORAL
  Filled 2016-08-28 (×5): qty 1

## 2016-08-28 MED ORDER — PREGABALIN 25 MG PO CAPS
25.0000 mg | ORAL_CAPSULE | Freq: Two times a day (BID) | ORAL | Status: DC
  Administered 2016-08-28 – 2016-09-02 (×10): 25 mg via ORAL
  Filled 2016-08-28 (×10): qty 1

## 2016-08-28 MED ORDER — ACETAMINOPHEN 325 MG PO TABS
650.0000 mg | ORAL_TABLET | Freq: Four times a day (QID) | ORAL | Status: DC | PRN
  Administered 2016-08-30: 09:00:00 650 mg via ORAL
  Filled 2016-08-28: qty 2

## 2016-08-28 MED ORDER — CARVEDILOL 6.25 MG PO TABS
6.2500 mg | ORAL_TABLET | Freq: Two times a day (BID) | ORAL | Status: DC
Start: 2016-08-28 — End: 2016-09-03
  Administered 2016-08-29 – 2016-09-02 (×8): 6.25 mg via ORAL
  Filled 2016-08-28 (×9): qty 1

## 2016-08-28 MED ORDER — ALBUTEROL SULFATE (2.5 MG/3ML) 0.083% IN NEBU
2.5000 mg | INHALATION_SOLUTION | RESPIRATORY_TRACT | Status: DC | PRN
  Administered 2016-08-29 – 2016-08-31 (×5): 2.5 mg via RESPIRATORY_TRACT
  Filled 2016-08-28 (×5): qty 3

## 2016-08-28 MED ORDER — ONDANSETRON HCL 4 MG PO TABS: 4.0000 mg | ORAL_TABLET | Freq: Four times a day (QID) | ORAL | Status: DC | PRN

## 2016-08-28 MED ORDER — INFLUENZA VAC SPLIT QUAD 0.5 ML IM SUSY: 0.5000 mL | PREFILLED_SYRINGE | INTRAMUSCULAR | Status: DC

## 2016-08-28 NOTE — ED Provider Notes (Addendum)
[facility] Emergency Department Provider Note   ____________________________________________    I have reviewed the triage vital signs and the nursing notes.   HISTORY  Chief Complaint Weakness     HPI Misty Medina is a 75 y.o. female who presents with complaints of weakness. Patient reports over the last week she has felt exhausted with no energy. She went to see her PCP who drew blood and called her and told her to come to the emergency department because her hemoglobin was low. Patient does have a history of chronic kidney disease but she was told by her doctor that her kidney function was worse as well. She denies fevers or chills. No shortness of breath. No cough. No nausea or vomiting. She does report intermittent diarrhea over the past couple days but denies dark stools. She does take iron pills.   Past Medical History:  Diagnosis Date  . Depression   . Fibromyalgia   . GERD (gastroesophageal reflux disease)   . Heart murmur   . Hypertension   . Hypothyroidism   . Osteoporosis     Patient Active Problem List   Diagnosis Date Noted  . Fracture of fifth toe, left, closed 09/16/2015  . Blister of foot without infection 09/16/2015  . Constipation 09/16/2015  . Falls 09/15/2015  . Frequent falls 09/14/2015  . Foot fracture, left 09/14/2015  . Hypothyroidism 09/14/2015  . Chronic systolic CHF (congestive heart failure) (HCC) 09/14/2015  . HTN (hypertension) 09/14/2015  . GERD (gastroesophageal reflux disease) 09/14/2015  . Depression 09/14/2015    Past Surgical History:  Procedure Laterality Date  . ABDOMINAL HYSTERECTOMY    . CHOLECYSTECTOMY      Prior to Admission medications   Medication Sig Start Date End Date Taking? Authorizing Provider  b complex vitamins tablet Take 1 tablet by mouth daily.    Historical Provider, MD  baclofen (LIORESAL) 10 MG tablet Take 10 mg by mouth 3 (three) times daily.    Historical Provider, MD    Biotin 1000 MCG tablet Take 1,000 mcg by mouth daily.    Historical Provider, MD  busPIRone (BUSPAR) 15 MG tablet Take 15 mg by mouth 2 (two) times daily.    Historical Provider, MD  carvedilol (COREG) 25 MG tablet Take 2 tablets (50 mg total) by mouth 2 (two) times daily. 09/16/15   Katharina Caperima Vaickute, MD  chlorthalidone (HYGROTON) 25 MG tablet Take 0.5 tablets (12.5 mg total) by mouth every other day. 09/16/15   Katharina Caperima Vaickute, MD  Cholecalciferol (VITAMIN D-3) 1000 UNITS CAPS Take 1,000 Units by mouth daily.    Historical Provider, MD  citalopram (CELEXA) 40 MG tablet Take 40 mg by mouth daily.    Historical Provider, MD  ferrous fumarate-iron polysaccharide complex (TANDEM) 162-115.2 MG CAPS capsule Take 1 capsule by mouth daily.    Historical Provider, MD  HYDROcodone-acetaminophen (NORCO) 10-325 MG tablet Take 1 tablet by mouth every 6 (six) hours as needed for moderate pain.    Historical Provider, MD  levothyroxine (SYNTHROID, LEVOTHROID) 75 MCG tablet Take 75 mcg by mouth daily.    Historical Provider, MD  lisinopril (PRINIVIL,ZESTRIL) 5 MG tablet Take 1 tablet (5 mg total) by mouth 2 (two) times daily. 09/16/15   Katharina Caperima Vaickute, MD  loratadine (CLARITIN) 10 MG tablet Take 10 mg by mouth daily.    Historical Provider, MD  pantoprazole (PROTONIX) 40 MG tablet Take 40 mg by mouth daily.    Historical Provider, MD  pregabalin (LYRICA) 25  MG capsule Take 25 mg by mouth 2 (two) times daily.    Historical Provider, MD  simvastatin (ZOCOR) 20 MG tablet Take 20 mg by mouth at bedtime.    Historical Provider, MD  zolpidem (AMBIEN) 5 MG tablet Take 0.5 tablets (2.5 mg total) by mouth at bedtime as needed for sleep. 09/16/15   Katharina Caperima Vaickute, MD     Allergies Codeine and Penicillins  Family History  Problem Relation Age of Onset  . Hypertension Mother   . Osteoporosis Mother   . Arthritis Father   . Heart attack Father     Social History Social History  Substance Use Topics  . Smoking status:  Never Smoker  . Smokeless tobacco: Never Used  . Alcohol use No    Review of Systems  Constitutional: No fever/chills Eyes: No visual changes.   Cardiovascular: Denies chest pain. Respiratory: Denies shortness of breath. Gastrointestinal: No abdominal pain.  No nausea, no vomiting.   Genitourinary: Negative for dysuria.  Skin: Negative for rash. Neurological: Negative for headaches   10-point ROS otherwise negative.  ____________________________________________   PHYSICAL EXAM:  VITAL SIGNS: ED Triage Vitals  Enc Vitals Group     BP 08/28/16 1024 123/62     Pulse Rate 08/28/16 1024 74     Resp 08/28/16 1024 18     Temp 08/28/16 1024 98.3 F (36.8 C)     Temp Source 08/28/16 1024 Oral     SpO2 08/28/16 1024 98 %     Weight 08/28/16 1024 160 lb (72.6 kg)     Height 08/28/16 1024 5\' 3"  (1.6 m)     Head Circumference --      Peak Flow --      Pain Score 08/28/16 1025 5     Pain Loc --      Pain Edu? --      Excl. in GC? --     Constitutional: Alert and oriented. No acute distress. Pleasant and interactive Eyes: Conjunctivae are Pale  Nose: No congestion/rhinnorhea. Mouth/Throat: Mucous membranes are dry    Cardiovascular: Normal rate, regular rhythm. Grossly normal heart sounds.  Good peripheral circulation. Respiratory: Normal respiratory effort.  No retractions. Lungs CTAB. Gastrointestinal: Soft and nontender. No distention.  No CVA tenderness. Guaiac positive brown stool Genitourinary: deferred Musculoskeletal: No lower extremity tenderness nor edema.  Warm and well perfused Neurologic:  Normal speech and language. No gross focal neurologic deficits are appreciated.  Skin:  Skin is warm, dry and intact. No rash noted. Psychiatric: Mood and affect are normal. Speech and behavior are normal.  ____________________________________________   LABS (all labs ordered are listed, but only abnormal results are displayed)  Labs Reviewed  COMPREHENSIVE METABOLIC  PANEL - Abnormal; Notable for the following:       Result Value   Glucose, Bld 101 (*)    BUN 42 (*)    Creatinine, Ser 2.50 (*)    Total Protein 6.1 (*)    Albumin 3.3 (*)    AST 132 (*)    ALT 88 (*)    Alkaline Phosphatase 213 (*)    GFR calc non Af Amer 18 (*)    GFR calc Af Amer 21 (*)    All other components within normal limits  CBC WITH DIFFERENTIAL/PLATELET - Abnormal; Notable for the following:    RBC 2.36 (*)    Hemoglobin 7.4 (*)    HCT 23.5 (*)    MCHC 31.3 (*)    All other components within normal  limits  TROPONIN I  URINALYSIS COMPLETEWITH MICROSCOPIC ([facility] ONLY)  CBC WITH DIFFERENTIAL/PLATELET  TYPE AND SCREEN  TYPE AND SCREEN   ____________________________________________  EKG  ED ECG REPORT I, Jene Every, the attending physician, personally viewed and interpreted this ECG.  Date: 08/28/2016 EKG Time: 10:25 AM Rate: 72 Rhythm: normal sinus rhythm QRS Axis: normal Intervals: normal ST/T Wave abnormalities: normal Conduction Disturbances: none Narrative Interpretation: unremarkable  ____________________________________________  RADIOLOGY  No acute distress ____________________________________________   PROCEDURES  Procedure(s) performed: No    Critical Care performed: No ____________________________________________   INITIAL IMPRESSION / ASSESSMENT AND PLAN / ED COURSE  Pertinent labs & imaging results that were available during my care of the patient were reviewed by me and considered in my medical decision making (see chart for details).  Patient with gradually worsening weakness and hemoglobin of 7.4 today. Her baseline is around 10 or 11. Faintly positive guaiac brown stool. We'll admit to the hospitalist service for further management, vitals stable  Clinical Course   ____________________________________________   FINAL CLINICAL IMPRESSION(S) / ED DIAGNOSES  Final diagnoses:  Gastrointestinal hemorrhage, unspecified  gastrointestinal hemorrhage type      NEW MEDICATIONS STARTED DURING THIS VISIT:  New Prescriptions   No medications on file     Note:  This document was prepared using Dragon voice recognition software and may include unintentional dictation errors.    Jene Every, MD 08/28/16 1204    Jene Every, MD 08/28/16 [phone]

## 2016-08-28 NOTE — H&P (Addendum)
[facility] - [place] at [facility]   PATIENT NAME: Misty Medina    MR#:  [id number]  DATE OF BIRTH:  [birth year]  DATE OF ADMISSION:  08/28/2016  PRIMARY CARE PHYSICIAN: Luna Fuse, MD   REQUESTING/REFERRING PHYSICIAN: Jene Every, MD  CHIEF COMPLAINT:   Chief Complaint  Patient presents with  . Weakness   Melena and weakness for 2 days. HISTORY OF PRESENT ILLNESS:  Misty Medina  is a 75 y.o. female with a known history of Hypertension, hypothyroidism and osteoporosis. The patient has had generalized weakness for the Past a few days. She noticed that she has had black stool for the past 2 days. He went to PCPs office and found anemia. She was sent to the ED for further evaluation. Hemoglobin decreased to 7.4. her baseline hemoglobin was 11.5 last year. Has been taking baclofen for unknown reason.  PAST MEDICAL HISTORY:   Past Medical History:  Diagnosis Date  . Depression   . Fibromyalgia   . GERD (gastroesophageal reflux disease)   . Heart murmur   . Hypertension   . Hypothyroidism   . Osteoporosis     PAST SURGICAL HISTORY:   Past Surgical History:  Procedure Laterality Date  . ABDOMINAL HYSTERECTOMY    . CHOLECYSTECTOMY      SOCIAL HISTORY:   Social History  Substance Use Topics  . Smoking status: Never Smoker  . Smokeless tobacco: Never Used  . Alcohol use No    FAMILY HISTORY:   Family History  Problem Relation Age of Onset  . Hypertension Mother   . Osteoporosis Mother   . Arthritis Father   . Heart attack Father     DRUG ALLERGIES:   Allergies  Allergen Reactions  . Codeine Nausea And Vomiting  . Penicillins Other (See Comments)    Reaction:  Unknown     REVIEW OF SYSTEMS:   Review of Systems  Constitutional: Positive for malaise/fatigue. Negative for chills and fever.  HENT: Negative for congestion.   Eyes: Negative for blurred vision and double vision.  Respiratory: Positive for shortness of  breath. Negative for cough and stridor.   Cardiovascular: Negative for chest pain and leg swelling.  Gastrointestinal: Positive for heartburn, melena and nausea. Negative for abdominal pain, blood in stool, diarrhea and vomiting.  Genitourinary: Negative for dysuria and urgency.  Musculoskeletal: Negative for joint pain.  Neurological: Positive for dizziness and weakness. Negative for focal weakness, loss of consciousness and headaches.  Psychiatric/Behavioral: Negative for depression. The patient is not nervous/anxious.     MEDICATIONS AT HOME:   Prior to Admission medications   Medication Sig Start Date End Date Taking? Authorizing Provider  b complex vitamins tablet Take 1 tablet by mouth daily.   Yes Historical Provider, MD  baclofen (LIORESAL) 10 MG tablet Take 10 mg by mouth 3 (three) times daily.   Yes Historical Provider, MD  Biotin 1000 MCG tablet Take 1,000 mcg by mouth daily.   Yes Historical Provider, MD  busPIRone (BUSPAR) 15 MG tablet Take 15 mg by mouth 2 (two) times daily.   Yes Historical Provider, MD  carvedilol (COREG) 6.25 MG tablet Take 6.25 mg by mouth 2 (two) times daily with a meal.   Yes Historical Provider, MD  chlorthalidone (HYGROTON) 25 MG tablet Take 0.5 tablets (12.5 mg total) by mouth every other day. 09/16/15  Yes Katharina Caper, MD  Cholecalciferol (VITAMIN D-3) 1000 UNITS CAPS Take 1,000 Units by mouth daily.   Yes Historical Provider, MD  citalopram (CELEXA) 40 MG tablet Take 40 mg by mouth daily.   Yes Historical Provider, MD  ferrous fumarate-iron polysaccharide complex (TANDEM) 162-115.2 MG CAPS capsule Take 1 capsule by mouth daily.   Yes Historical Provider, MD  HYDROcodone-acetaminophen (NORCO) 10-325 MG tablet Take 1 tablet by mouth every 6 (six) hours as needed for moderate pain.   Yes Historical Provider, MD  levothyroxine (SYNTHROID, LEVOTHROID) 75 MCG tablet Take 75 mcg by mouth daily.   Yes Historical Provider, MD  loratadine (CLARITIN) 10 MG  tablet Take 10 mg by mouth daily.   Yes Historical Provider, MD  magnesium oxide (MAG-OX) 400 MG tablet Take 400 mg by mouth daily.   Yes Historical Provider, MD  pantoprazole (PROTONIX) 40 MG tablet Take 40 mg by mouth daily.   Yes Historical Provider, MD  pregabalin (LYRICA) 25 MG capsule Take 25 mg by mouth 2 (two) times daily.   Yes Historical Provider, MD  tolterodine (DETROL LA) 4 MG 24 hr capsule Take 4 mg by mouth daily.   Yes Historical Provider, MD  zolpidem (AMBIEN) 5 MG tablet Take 0.5 tablets (2.5 mg total) by mouth at bedtime as needed for sleep. 09/16/15  Yes Katharina Caperima Vaickute, MD      VITAL SIGNS:  Blood pressure (!) 138/58, pulse 72, temperature 98.5 F (36.9 C), temperature source Oral, resp. rate 18, height 5\' 3"  (1.6 m), weight 165 lb 2 oz (74.9 kg), SpO2 99 %.  PHYSICAL EXAMINATION:  Physical Exam  Constitutional: She is oriented to person, place, and time and well-developed, well-nourished, and in no distress.  HENT:  Mouth/Throat: Oropharynx is clear and moist.  Eyes: Conjunctivae and EOM are normal. Pupils are equal, round, and reactive to light. No scleral icterus.  Neck: Normal range of motion. Neck supple. No JVD present. No tracheal deviation present.  Cardiovascular: Normal rate, regular rhythm and normal heart sounds.  Exam reveals no gallop.   No murmur heard. Pulmonary/Chest: Effort normal and breath sounds normal. No respiratory distress. She has no wheezes. She has no rales.  Abdominal: Bowel sounds are normal. She exhibits no distension. There is no tenderness.  Musculoskeletal: Normal range of motion. She exhibits no edema or tenderness.  Neurological: She is alert and oriented to person, place, and time. No cranial nerve deficit.  Skin: No rash noted. No erythema.  Psychiatric: Affect and judgment normal.    LABORATORY PANEL:   CBC  Recent Labs Lab 08/28/16 1027  WBC 8.6  HGB 7.4*  HCT 23.5*  PLT 210    ------------------------------------------------------------------------------------------------------------------  Chemistries   Recent Labs Lab 08/28/16 1027  NA 137  K 4.1  CL 105  CO2 23  GLUCOSE 101*  BUN 42*  CREATININE 2.50*  CALCIUM 9.0  AST 132*  ALT 88*  ALKPHOS 213*  BILITOT 0.9   ------------------------------------------------------------------------------------------------------------------  Cardiac Enzymes  Recent Labs Lab 08/28/16 1027  TROPONINI <0.03   ------------------------------------------------------------------------------------------------------------------  RADIOLOGY:  Dg Chest Portable 1 View  Result Date: 08/28/2016 CLINICAL DATA:  Weakness for 1 week EXAM: PORTABLE CHEST 1 VIEW COMPARISON:  09/14/2015 FINDINGS: Cardiomegaly. No infiltrate or pleural effusion. No pulmonary edema. Bilateral basilar atelectasis. Atherosclerotic calcifications of thoracic aorta. IMPRESSION: Cardiomegaly. No infiltrate or pulmonary edema. Bilateral basilar atelectasis. Electronically Signed   By: Natasha MeadLiviu  Pop M.D.   On: 08/28/2016 10:57      IMPRESSION AND PLAN:   Upper GI bleeding with Symptomatic anemia due to acute blood loss The patient will be admitted to medical floor. Start famotidine IV  twice a day, nothing by mouth except medication and water, IV fluid support, follow-up hemoglobin every 8 hours and GI consult.  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 Discontinue baclofen and start IV fluid support, follow-up BMP.  Hypertension. Continue hypertension medication.  All the records are reviewed and case discussed with ED provider. Management plans discussed with the patient, Her grandson and they are in agreement.  CODE STATUS: Full code  TOTAL TIME TAKING CARE OF THIS PATIENT: 56 minutes.    Shaune Pollackhen, Jsaon Yoo M.D on 08/28/2016 at 1:46 PM  Between 7am to 6pm - Pager - [phone]  After 6pm go to [email or web address] - Social research officer, [email or web address] [email or web address]  [facility] [place] [facility]  Office  [phone]  CC: Primary care physician; Luna FuseEJAN-SIE, SHEIKH AHMED, MD   Note: This dictation was prepared with Dragon dictation along with smaller phrase technology. Any transcriptional errors that result from this process are unintentional.

## 2016-08-28 NOTE — Consult Note (Signed)
Consultation  Referring Provider:     No ref. provider found Primary Care Physician:  Luna FuseEJAN-SIE, SHEIKH AHMED, MD Primary Gastroenterologist:  Dr. Nedra HaiLee         Reason for Consultation:     GI bleed with anemia.  Date of Admission:  08/28/2016 Date of Consultation:  08/28/2016         HPI:   Misty Medina is a 75 y.o. female with a GI bleed with heme positive stool and anemia. She noted weakness over recent history but no evidence of a GI bleed. She denies NSAID usage .  Past Medical History:  Diagnosis Date  . Depression   . Fibromyalgia   . GERD (gastroesophageal reflux disease)   . Heart murmur   . Hypertension   . Hypothyroidism   . Osteoporosis     Past Surgical History:  Procedure Laterality Date  . ABDOMINAL HYSTERECTOMY    . CHOLECYSTECTOMY      Prior to Admission medications   Medication Sig Start Date End Date Taking? Authorizing Provider  b complex vitamins tablet Take 1 tablet by mouth daily.   Yes Historical Provider, MD  baclofen (LIORESAL) 10 MG tablet Take 10 mg by mouth 3 (three) times daily.   Yes Historical Provider, MD  Biotin 1000 MCG tablet Take 1,000 mcg by mouth daily.   Yes Historical Provider, MD  busPIRone (BUSPAR) 15 MG tablet Take 15 mg by mouth 2 (two) times daily.   Yes Historical Provider, MD  carvedilol (COREG) 6.25 MG tablet Take 6.25 mg by mouth 2 (two) times daily with a meal.   Yes Historical Provider, MD  chlorthalidone (HYGROTON) 25 MG tablet Take 0.5 tablets (12.5 mg total) by mouth every other day. 09/16/15  Yes Katharina Caperima Vaickute, MD  Cholecalciferol (VITAMIN D-3) 1000 UNITS CAPS Take 1,000 Units by mouth daily.   Yes Historical Provider, MD  citalopram (CELEXA) 40 MG tablet Take 40 mg by mouth daily.   Yes Historical Provider, MD  ferrous fumarate-iron polysaccharide complex (TANDEM) 162-115.2 MG CAPS capsule Take 1 capsule by mouth daily.   Yes Historical Provider, MD  HYDROcodone-acetaminophen (NORCO) 10-325 MG tablet Take 1 tablet  by mouth every 6 (six) hours as needed for moderate pain.   Yes Historical Provider, MD  levothyroxine (SYNTHROID, LEVOTHROID) 75 MCG tablet Take 75 mcg by mouth daily.   Yes Historical Provider, MD  loratadine (CLARITIN) 10 MG tablet Take 10 mg by mouth daily.   Yes Historical Provider, MD  magnesium oxide (MAG-OX) 400 MG tablet Take 400 mg by mouth daily.   Yes Historical Provider, MD  pantoprazole (PROTONIX) 40 MG tablet Take 40 mg by mouth daily.   Yes Historical Provider, MD  pregabalin (LYRICA) 25 MG capsule Take 25 mg by mouth 2 (two) times daily.   Yes Historical Provider, MD  tolterodine (DETROL LA) 4 MG 24 hr capsule Take 4 mg by mouth daily.   Yes Historical Provider, MD  zolpidem (AMBIEN) 5 MG tablet Take 0.5 tablets (2.5 mg total) by mouth at bedtime as needed for sleep. 09/16/15  Yes Katharina Caperima Vaickute, MD    Family History  Problem Relation Age of Onset  . Hypertension Mother   . Osteoporosis Mother   . Arthritis Father   . Heart attack Father      Social History  Substance Use Topics  . Smoking status: Never Smoker  . Smokeless tobacco: Never Used  . Alcohol use No    Allergies as of 08/28/2016 - Review  Complete 08/28/2016  Allergen Reaction Noted  . Codeine Nausea And Vomiting 09/07/2015  . Penicillins Other (See Comments) 09/10/2015    Review of Systems:    All systems reviewed and negative except where noted in HPI.   Physical Exam:  Vital signs in last 24 hours: Temp:  [98.3 F (36.8 C)-98.5 F (36.9 C)] 98.5 F (36.9 C) (10/30 1319) Pulse Rate:  [71-74] 72 (10/30 1319) Resp:  [15-18] 18 (10/30 1319) BP: (123-138)/(58-62) 138/58 (10/30 1319) SpO2:  [98 %-99 %] 99 % (10/30 1319) Weight:  [160 lb (72.6 kg)-165 lb 2 oz (74.9 kg)] 165 lb 2 oz (74.9 kg) (10/30 1319) Last BM Date: 08/26/16 General:   Pleasant, cooperative in NAD Head:  Normocephalic and atraumatic. Eyes:   No icterus.   Conjunctiva pink. PERRLA. Ears:  Normal auditory acuity. Neck:  Supple;  no masses or thyroidomegaly Lungs: Respirations even and unlabored. Lungs clear to auscultation bilaterally.   No wheezes, crackles, or rhonchi.  Heart:  Regular rate and rhythm;  Without murmur, clicks, rubs or gallops Abdomen:  Soft, nondistended, nontender. Normal bowel sounds. No appreciable masses or hepatomegaly.  No rebound or guarding.  Rectal:  Not performed. Msk:  Symmetrical without gross deformities.  Strength normal and symmetrical..  Extremities:  Without edema, cyanosis or clubbing. Neurologic:  Alert and oriented x3;  grossly normal neurologically. Skin:  Intact without significant lesions or rashes. Cervical Nodes:  No significant cervical adenopathy. Psych:  Alert and cooperative. Normal affect.  LAB RESULTS:  Recent Labs  08/28/16 1027  WBC 8.6  HGB 7.4*  HCT 23.5*  PLT 210   BMET  Recent Labs  08/28/16 1027  NA 137  K 4.1  CL 105  CO2 23  GLUCOSE 101*  BUN 42*  CREATININE 2.50*  CALCIUM 9.0   LFT  Recent Labs  08/28/16 1027  PROT 6.1*  ALBUMIN 3.3*  AST 132*  ALT 88*  ALKPHOS 213*  BILITOT 0.9   PT/INR No results for input(s): LABPROT, INR in the last 72 hours.  STUDIES: Dg Chest Portable 1 View  Result Date: 08/28/2016 CLINICAL DATA:  Weakness for 1 week EXAM: PORTABLE CHEST 1 VIEW COMPARISON:  09/14/2015 FINDINGS: Cardiomegaly. No infiltrate or pleural effusion. No pulmonary edema. Bilateral basilar atelectasis. Atherosclerotic calcifications of thoracic aorta. IMPRESSION: Cardiomegaly. No infiltrate or pulmonary edema. Bilateral basilar atelectasis. Electronically Signed   By: Natasha MeadLiviu  Pop M.D.   On: 08/28/2016 10:57      Impression / Plan:   Misty Medina is a 75 y.o. y/o female with a GI bleed with heme positive stool and anemia./ Plan - Supportive therapy with endoscopy for diagnosis and possible therapy depending on clinical progress.  Thank you for involving me in the care of this patient.      LOS: 0 days   Ula Lingoichard Raneisha Bress,  MD  08/28/2016, 4:27 PM   Note: This dictation was prepared with Dragon dictation along with smaller phrase technology. Any transcriptional errors that result from this process are unintentional.

## 2016-08-28 NOTE — ED Triage Notes (Signed)
Pt from home via ems with reports that pt has been having generalized weakness x 1 week, went to PCP last week and was called Friday and told that she had abnormal labs, unsure of what they were. Pt reports generalized leg aching.

## 2016-08-28 NOTE — Progress Notes (Addendum)
Pt stating that she takes Norco scheduled 4x/day.  Dr. Imogene Burnhen paged, awaiting return call.   ETA: Per Dr. Imogene Burnhen verify with pt's pharmacy and can change if her prescription is not as needed.  Verified with pharmacy that pt's prescription is q 4hrs PRN, educated pt that she will have to ask for pain medication when needed. Orson Apeanielle Shepherd Finnan, RN

## 2016-08-29 ENCOUNTER — Encounter
Admission: EM | Disposition: A | Discharge status: Home / Self Care | Payer: Self-pay | Source: Home / Self Care | Attending: Internal Medicine

## 2016-08-29 ENCOUNTER — Inpatient Hospital Stay: Payer: Medicare Other | Admitting: Anesthesiology

## 2016-08-29 ENCOUNTER — Encounter: Payer: Self-pay | Admitting: *Deleted

## 2016-08-29 ENCOUNTER — Inpatient Hospital Stay: Payer: Medicare Other

## 2016-08-29 HISTORY — PX: ESOPHAGOGASTRODUODENOSCOPY (EGD) WITH PROPOFOL: SHX5813

## 2016-08-29 LAB — CBC
HEMATOCRIT: 20.5 % — AB (ref 35.0–47.0)
Hemoglobin: 6.6 g/dL — ABNORMAL LOW (ref 12.0–16.0)
MCH: 31.8 pg (ref 26.0–34.0)
MCHC: 32.3 g/dL (ref 32.0–36.0)
MCV: 98.4 fL (ref 80.0–100.0)
PLATELETS: 197 10*3/uL (ref 150–440)
RBC: 2.08 MIL/uL — ABNORMAL LOW (ref 3.80–5.20)
RDW: 13.7 % (ref 11.5–14.5)
WBC: 6.3 10*3/uL (ref 3.6–11.0)

## 2016-08-29 LAB — HEMOGLOBIN AND HEMATOCRIT, BLOOD
HEMATOCRIT: 34.8 % — AB (ref 35.0–47.0)
HEMOGLOBIN: 11.5 g/dL — AB (ref 12.0–16.0)

## 2016-08-29 LAB — BASIC METABOLIC PANEL
Anion gap: 7 (ref 5–15)
BUN: 38 mg/dL — AB (ref 6–20)
CHLORIDE: 107 mmol/L (ref 101–111)
CO2: 23 mmol/L (ref 22–32)
CREATININE: 2.04 mg/dL — AB (ref 0.44–1.00)
Calcium: 8.2 mg/dL — ABNORMAL LOW (ref 8.9–10.3)
GFR calc Af Amer: 26 mL/min — ABNORMAL LOW (ref >60)
GFR calc non Af Amer: 23 mL/min — ABNORMAL LOW (ref >60)
GLUCOSE: 77 mg/dL (ref 65–99)
POTASSIUM: 4.3 mmol/L (ref 3.5–5.1)
Sodium: 137 mmol/L (ref 135–145)

## 2016-08-29 LAB — URINALYSIS COMPLETE WITH MICROSCOPIC (ARMC ONLY)
Bilirubin Urine: NEGATIVE
GLUCOSE, UA: NEGATIVE mg/dL
NITRITE: POSITIVE — AB
Protein, ur: NEGATIVE mg/dL
RBC / HPF: NONE SEEN RBC/hpf (ref 0–5)
SPECIFIC GRAVITY, URINE: 1.011 (ref 1.005–1.030)
pH: 5 (ref 5.0–8.0)

## 2016-08-29 LAB — PREPARE RBC (CROSSMATCH)

## 2016-08-29 LAB — HEMOGLOBIN
Hemoglobin: 6.7 g/dL — ABNORMAL LOW (ref 12.0–16.0)
Hemoglobin: 8.7 g/dL — ABNORMAL LOW (ref 12.0–16.0)

## 2016-08-29 LAB — ABO/RH: ABO/RH(D): AB POS

## 2016-08-29 SURGERY — ESOPHAGOGASTRODUODENOSCOPY (EGD) WITH PROPOFOL
Anesthesia: General

## 2016-08-29 MED ORDER — DEXTROSE 5 % IV SOLN: 1.0000 g | INTRAVENOUS | Status: DC

## 2016-08-29 MED ORDER — SODIUM CHLORIDE 0.9 % IV SOLN
Freq: Once | INTRAVENOUS | Status: AC
  Administered 2016-08-29: 02:00:00 via INTRAVENOUS

## 2016-08-29 MED ORDER — CITALOPRAM HYDROBROMIDE 20 MG PO TABS
20.0000 mg | ORAL_TABLET | Freq: Every day | ORAL | Status: DC
  Administered 2016-08-30 – 2016-09-02 (×4): 20 mg via ORAL
  Filled 2016-08-29 (×4): qty 1

## 2016-08-29 MED ORDER — CEFTRIAXONE SODIUM-DEXTROSE 1-3.74 GM-% IV SOLR
1.0000 g | Freq: Every day | INTRAVENOUS | Status: DC
  Administered 2016-08-30 – 2016-08-31 (×2): 1 g via INTRAVENOUS
  Filled 2016-08-29 (×2): qty 50

## 2016-08-29 MED ORDER — PANTOPRAZOLE SODIUM 40 MG IV SOLR
40.0000 mg | Freq: Two times a day (BID) | INTRAVENOUS | Status: DC
  Administered 2016-08-29 – 2016-08-31 (×4): 40 mg via INTRAVENOUS
  Filled 2016-08-29 (×4): qty 40

## 2016-08-29 MED ORDER — FAMOTIDINE 20 MG PO TABS: 20.0000 mg | ORAL_TABLET | Freq: Every day | ORAL | Status: DC

## 2016-08-29 MED ORDER — FENTANYL CITRATE (PF) 100 MCG/2ML IJ SOLN: 25.0000 ug | INTRAMUSCULAR | Status: DC | PRN

## 2016-08-29 MED ORDER — ONDANSETRON HCL 4 MG/2ML IJ SOLN: 4.0000 mg | Freq: Once | INTRAMUSCULAR | Status: DC | PRN

## 2016-08-29 MED ORDER — SODIUM CHLORIDE 0.9 % IV SOLN
INTRAVENOUS | Status: DC
  Administered 2016-08-29 (×2): via INTRAVENOUS

## 2016-08-29 MED ORDER — PROPOFOL 10 MG/ML IV BOLUS
INTRAVENOUS | Status: DC | PRN
  Administered 2016-08-29: 100 mg via INTRAVENOUS

## 2016-08-29 MED ORDER — PROPOFOL 500 MG/50ML IV EMUL
INTRAVENOUS | Status: DC | PRN
  Administered 2016-08-29: 120 ug/kg/min via INTRAVENOUS

## 2016-08-29 NOTE — Anesthesia Postprocedure Evaluation (Signed)
Anesthesia Post Note  Patient: Tera PartridgeBrenda G Dura  Procedure(s) Performed: Procedure(s) (LRB): ESOPHAGOGASTRODUODENOSCOPY (EGD) WITH PROPOFOL (N/A)  Patient location during evaluation: PACU Anesthesia Type: General Level of consciousness: awake and alert and oriented Pain management: pain level controlled Vital Signs Assessment: post-procedure vital signs reviewed and stable Respiratory status: spontaneous breathing Cardiovascular status: blood pressure returned to baseline Anesthetic complications: no    Last Vitals:  Vitals:   08/29/16 1706 08/29/16 1952  BP: (!) 137/57 (!) 158/74  Pulse: 74 77  Resp: 20 18  Temp: 36.6 C 36.7 C    Last Pain:  Vitals:   08/29/16 2217  TempSrc:   PainSc: 5                  Anderson Middlebrooks

## 2016-08-29 NOTE — Care Management Important Message (Signed)
Important Message  Patient Details  Name: Tera PartridgeBrenda G Mccoy MRN: [id number] Date of Birth: 10/09/1941   Medicare Important Message Given:  Yes    Gwenette GreetBrenda S Dino Borntreger, RN 08/29/2016, 8:23 AM

## 2016-08-29 NOTE — Transfer of Care (Signed)
Immediate Anesthesia Transfer of Care Note  Patient: Misty Medina  Procedure(s) Performed: Procedure(s): ESOPHAGOGASTRODUODENOSCOPY (EGD) WITH PROPOFOL (N/A)  Patient Location: PACU and Endoscopy Unit  Anesthesia Type:General  Level of Consciousness: awake, alert  and oriented  Airway & Oxygen Therapy: Patient Spontanous Breathing and Patient connected to nasal cannula oxygen  Post-op Assessment: Report given to RN and Post -op Vital signs reviewed and stable  Post vital signs: Reviewed and stable  Last Vitals:  Vitals:   08/29/16 1507 08/29/16 1602  BP: (!) 141/58 110/67  Pulse: 76 85  Resp: 20   Temp: 36.8 C     Last Pain:  Vitals:   08/29/16 1507  TempSrc: Tympanic  PainSc: 0-No pain      Patients Stated Pain Goal: 3 (08/29/16 0920)  Complications: No apparent anesthesia complications

## 2016-08-29 NOTE — Progress Notes (Signed)
  CONCERNING: IV to Oral Route Change Policy  RECOMMENDATION: This patient is receiving famotidine by the intravenous route.  Based on criteria approved by the Pharmacy and Therapeutics Committee, the intravenous medication(s) is/are being converted to the equivalent oral dose form(s).   DESCRIPTION: These criteria include:  The patient is eating (either orally or via tube) and/or has been taking other orally administered medications for a least 24 hours  The patient has no evidence of active gastrointestinal bleeding or impaired GI absorption (gastrectomy, short bowel, patient on TNA or NPO).  If you have questions about this conversion, please contact the Pharmacy Department  []   [phone]( [phone] )  [facility] [x]   [phone]( [phone] )  [facility] []   [phone]( [phone]  [facility] []   [phone]( [phone]  [facility] []   [phone]( [phone] )  [facility]   Simpson,Michael L, [facility] 08/29/2016 9:17 AM

## 2016-08-29 NOTE — Progress Notes (Addendum)
[facility] [place] [facility]   PATIENT NAME: Misty SloopBrenda Medina    MR#:  [id number]  DATE OF BIRTH:  02/19/1941  SUBJECTIVE:  CHIEF COMPLAINT:   Chief Complaint  Patient presents with  . Weakness   The patient is 75 year old Caucasian female with past medical history significant for history of depression, fibromyalgia, gastroesophageal reflux disease, hypertension, hypothyroidism, osteoporosis, who presented to the hospital with generalized weakness, black stool for the past 2 days. She was seen by PCP, and was found to be anemic and sent to emergency room for further evaluation and treatment. No more stools since admission. Being transfused packed red blood cells due to hemoglobin level of 6.7 earlier today.    Review of Systems  Constitutional: Negative for chills, fever and weight loss.  HENT: Negative for congestion.   Eyes: Negative for blurred vision and double vision.  Respiratory: Negative for cough, sputum production, shortness of breath and wheezing.   Cardiovascular: Negative for chest pain, palpitations, orthopnea, leg swelling and PND.  Gastrointestinal: Positive for melena. Negative for abdominal pain, blood in stool, constipation, diarrhea, nausea and vomiting.  Genitourinary: Negative for dysuria, frequency, hematuria and urgency.  Musculoskeletal: Negative for falls.  Neurological: Negative for dizziness, tremors, focal weakness and headaches.  Endo/Heme/Allergies: Does not bruise/bleed easily.  Psychiatric/Behavioral: Negative for depression. The patient does not have insomnia.     VITAL SIGNS: Blood pressure 111/72, pulse 70, temperature 98.1 F (36.7 C), temperature source Oral, resp. rate 16, height 5\' 3"  (1.6 m), weight 74.9 kg (165 lb 2 oz), SpO2 100 %.  PHYSICAL EXAMINATION:   GENERAL:  75 y.o.-year-old patient lying in the bed with no acute distress.  EYES: Pupils equal, round, reactive to light and accommodation. No scleral  icterus. Extraocular muscles intact.  HEENT: Head atraumatic, normocephalic. Oropharynx and nasopharynx clear.  NECK:  Supple, no jugular venous distention. No thyroid enlargement, no tenderness.  LUNGS: Normal breath sounds bilaterally, no wheezing, rales,rhonchi or crepitation. No use of accessory muscles of respiration.  CARDIOVASCULAR: S1, S2 normal. No murmurs, rubs, or gallops.  ABDOMEN: Soft, nontender, nondistended. Bowel sounds present. No organomegaly or mass.  EXTREMITIES: No pedal edema, cyanosis, or clubbing.  NEUROLOGIC: Cranial nerves II through XII are intact. Muscle strength 5/5 in all extremities. Sensation intact. Gait not checked.  PSYCHIATRIC: The patient is alert and oriented x 3.  SKIN: No obvious rash, lesion, or ulcer.   ORDERS/RESULTS REVIEWED:   CBC  Recent Labs Lab 08/28/16 1027 08/29/16 0012 08/29/16 0805  WBC 8.6 6.3  --   HGB 7.4* 6.7*  6.6* 8.7*  HCT 23.5* 20.5*  --   PLT 210 197  --   MCV 99.9 98.4  --   MCH 31.2 31.8  --   MCHC 31.3* 32.3  --   RDW 13.9 13.7  --   LYMPHSABS 1.4  --   --   MONOABS 0.4  --   --   EOSABS 0.4  --   --   BASOSABS 0.1  --   --    ------------------------------------------------------------------------------------------------------------------  Chemistries   Recent Labs Lab 08/28/16 1027 08/29/16 0012  NA 137 137  K 4.1 4.3  CL 105 107  CO2 23 23  GLUCOSE 101* 77  BUN 42* 38*  CREATININE 2.50* 2.04*  CALCIUM 9.0 8.2*  AST 132*  --   ALT 88*  --   ALKPHOS 213*  --   BILITOT 0.9  --    ------------------------------------------------------------------------------------------------------------------ estimated creatinine clearance  is 23.1 mL/min (by C-G formula based on SCr of 2.04 mg/dL (H)). ------------------------------------------------------------------------------------------------------------------ No results for input(s): TSH, T4TOTAL, T3FREE, THYROIDAB in the last 72 hours.  Invalid  input(s): FREET3  Cardiac Enzymes  Recent Labs Lab 08/28/16 1027  TROPONINI <0.03   ------------------------------------------------------------------------------------------------------------------ Invalid input(s): POCBNP ---------------------------------------------------------------------------------------------------------------  RADIOLOGY: Dg Chest Portable 1 View  Result Date: 08/28/2016 CLINICAL DATA:  Weakness for 1 week EXAM: PORTABLE CHEST 1 VIEW COMPARISON:  09/14/2015 FINDINGS: Cardiomegaly. No infiltrate or pleural effusion. No pulmonary edema. Bilateral basilar atelectasis. Atherosclerotic calcifications of thoracic aorta. IMPRESSION: Cardiomegaly. No infiltrate or pulmonary edema. Bilateral basilar atelectasis. Electronically Signed   By: Natasha MeadLiviu  Pop M.D.   On: 08/28/2016 10:57    EKG:  Orders placed or performed during the hospital encounter of 08/28/16  . ED EKG  . ED EKG    ASSESSMENT AND PLAN:  Active Problems:   GIB (gastrointestinal bleeding)  #1. Acute posthemorrhagic anemia, tach, status post 2 units of packed red blood cell transfusion, hemoglobin level has improved, follow hemoglobin level and transfuse as needed #2. Gastrointestinal bleed of unclear etiology, get cardiology consultation is requested, continue Protonix intravenously #3. Acute on chronic, likely renal failure, creatinine 1.17 in 2016, however, 2.2-2.5 in 2017, improved with IV fluid administration, packed red blood cell transfusion, follow creatinine closely, urinalysis is markedly abnormal revealing too numerous to count white blood cells, white blood cell clumps, concerning for urinary tract infection, get urinary cultures, renal ultrasound #4. Essential hypertension, well-controlled on current therapy #5 Pyuria, likely UTI, get culture, initiate on Rocephin IV   Management plans discussed with the patient, family and they are in agreement.   DRUG ALLERGIES:  Allergies  Allergen  Reactions  . Codeine Nausea And Vomiting  . Penicillins Other (See Comments)    Reaction:  Unknown     CODE STATUS:     Code Status Orders        Start     Ordered   08/28/16 1323  Full code  Continuous     08/28/16 1322    Code Status History    Date Active Date Inactive Code Status Order ID Comments User Context   09/14/2015 10:36 PM 09/16/2015  9:58 PM Full Code [id number]  Oralia Manisavid Willis, MD Inpatient      TOTAL TIME TAKING CARE OF THIS PATIENT: 40 minutes.    Katharina CaperVAICKUTE,Chestine Belknap M.D on 08/29/2016 at 1:22 PM  Between 7am to 6pm - Pager - [phone]  After 6pm go to [email or web address] - password [email or web address] [facility]  [facility] [place] [facility]  Office  [phone]  CC: Primary care physician; Luna FuseEJAN-SIE, SHEIKH AHMED, MD

## 2016-08-29 NOTE — Anesthesia Preprocedure Evaluation (Signed)
Anesthesia Evaluation  Patient identified by MRN, date of birth, ID band Patient awake    Reviewed: Allergy & Precautions, NPO status , Patient's Chart, lab work & pertinent test results, reviewed documented beta blocker date and time   Airway Mallampati: III  TM Distance: >3 FB     Dental  (+) Lower Dentures, Upper Dentures   Pulmonary neg pulmonary ROS,    Pulmonary exam normal        Cardiovascular hypertension, Pt. on medications and Pt. on home beta blockers +CHF  Normal cardiovascular exam+ Valvular Problems/Murmurs      Neuro/Psych PSYCHIATRIC DISORDERS Depression negative neurological ROS     GI/Hepatic Neg liver ROS, GERD  ,Upper GI bleed   Endo/Other  Hypothyroidism   Renal/GU negative Renal ROS  negative genitourinary   Musculoskeletal  (+) Fibromyalgia -  Abdominal Normal abdominal exam  (+)   Peds negative pediatric ROS (+)  Hematology negative hematology ROS (+)   Anesthesia Other Findings   Reproductive/Obstetrics                            Anesthesia Physical Anesthesia Plan  ASA: III  Anesthesia Plan: General   Post-op Pain Management:    Induction: Intravenous  Airway Management Planned: Nasal Cannula  Additional Equipment:   Intra-op Plan:   Post-operative Plan:   Informed Consent: I have reviewed the patients History and Physical, chart, labs and discussed the procedure including the risks, benefits and alternatives for the proposed anesthesia with the patient or authorized representative who has indicated his/her understanding and acceptance.   Dental advisory given  Plan Discussed with: CRNA and Surgeon  Anesthesia Plan Comments:         Anesthesia Quick Evaluation

## 2016-08-29 NOTE — Op Note (Signed)
[facility] Patient Name: Misty SloopBrenda Medina Procedure Date: 08/29/2016 3:36 PM MRN: [id number] Account #: [id number] Date of Birth: 02/09/1941 Admit Type: Inpatient Age: 75 [facility] Gender: Female Note Status: Finalized Procedure:            Upper GI endoscopy Indications:          Acute post hemorrhagic anemia, Melena Providers:            Misty Lingoichard Hussam Muniz, MD Referring MD:         Silas FloodSheikh A. Ellsworth Lennoxejan-sie, MD (Referring MD) Medicines:            Monitored Anesthesia Care Complications:        No immediate complications. Procedure:            Pre-Anesthesia Assessment:                       - Prior to the procedure, a History and Physical was                        performed, and patient medications and allergies were                        reviewed. The risks and benefits of the procedure and                        the sedation options and risks were discussed with the                        patient. All questions were answered and informed                        consent was obtained. Patient identification and                        proposed procedure were verified. Mental Status                        Examination: alert and oriented. Airway Examination:                        normal oropharyngeal airway and neck mobility.                        Respiratory Examination: clear to auscultation. CV                        Examination: regular rate and rhythm. ASA Grade                        Assessment: III - A patient with severe systemic                        disease. After reviewing the risks and benefits, the                        patient was deemed in satisfactory condition to undergo                        the procedure. The anesthesia plan was to use monitored  anesthesia care (MAC). Immediately prior to                        administration of medications, the patient was                        re-assessed for  adequacy to receive sedatives. The                        heart rate, respiratory rate, oxygen saturations, blood                        pressure, adequacy of pulmonary ventilation, and                        response to care were monitored throughout the                        procedure. The physical status of the patient was                        re-assessed after the procedure.                       After obtaining informed consent, the endoscope was                        passed under direct vision. Throughout the procedure,                        the patient's blood pressure, pulse, and oxygen                        saturations were monitored continuously. The Endoscope                        was introduced through the mouth, and advanced to the                        second part of duodenum. The upper GI endoscopy was                        accomplished without difficulty. The patient tolerated                        the procedure well. Findings:      A hiatal hernia was found. The hiatal narrowing was 36 cm from the       incisors. The Z-line was 32 cm from the incisors.      One non-bleeding cratered gastric ulcer with no stigmata of bleeding was       found in the gastric fundus. The lesion was 10 mm in largest dimension.       Biopsies were taken with a cold forceps for histology. Impression:           - Hiatal hernia.                       - Non-bleeding gastric ulcer with no stigmata of  bleeding. Biopsied. Recommendation:       - Await pathology results.                       - Use a proton pump inhibitor PO.                       - Return patient to hospital ward for ongoing care. Procedure Code(s):    --- Professional ---                       [phone] [id number], Esophagogastroduodenoscopy, flexible, transoral;                        with biopsy, single or multiple Diagnosis Code(s):    --- Professional ---                       K44.9, Diaphragmatic hernia  without obstruction or                        gangrene                       K25.9, Gastric ulcer, unspecified as acute or chronic,                        without hemorrhage or perforation                       D62, Acute posthemorrhagic anemia                       K92.1, Melena (includes Hematochezia) CPT copyright 2016 American Medical Association. All rights reserved. The codes documented in this report are preliminary and upon coder review may  be revised to meet current compliance requirements. Misty Lingoichard Ishi Danser, MD Misty Lingoichard Samnang Shugars, MD 08/29/2016 4:03:24 PM This report has been signed electronically. Number of Addenda: 0 Note Initiated On: 08/29/2016 3:36 PM Estimated Blood Loss: Estimated blood loss was minimal.      [facility]

## 2016-08-30 ENCOUNTER — Encounter: Payer: Self-pay | Admitting: Internal Medicine

## 2016-08-30 ENCOUNTER — Inpatient Hospital Stay: Payer: Medicare Other

## 2016-08-30 LAB — TYPE AND SCREEN
ABO/RH(D): AB POS
Antibody Screen: NEGATIVE
Unit division: 0
Unit division: 0

## 2016-08-30 LAB — CREATININE, SERUM
CREATININE: 1.62 mg/dL — AB (ref 0.44–1.00)
GFR calc Af Amer: 35 mL/min — ABNORMAL LOW (ref >60)
GFR calc non Af Amer: 30 mL/min — ABNORMAL LOW (ref >60)

## 2016-08-30 LAB — HEMOGLOBIN: HEMOGLOBIN: 10.2 g/dL — AB (ref 12.0–16.0)

## 2016-08-30 LAB — INFLUENZA PANEL BY PCR (TYPE A & B)
INFLAPCR: NEGATIVE
INFLBPCR: NEGATIVE

## 2016-08-30 LAB — MAGNESIUM: MAGNESIUM: 2.1 mg/dL (ref 1.7–2.4)

## 2016-08-30 MED ORDER — AZITHROMYCIN 500 MG IV SOLR
500.0000 mg | INTRAVENOUS | Status: DC
  Administered 2016-08-30 – 2016-08-31 (×2): 500 mg via INTRAVENOUS
  Filled 2016-08-30 (×2): qty 500

## 2016-08-30 MED ORDER — SODIUM CHLORIDE 0.9 % IV SOLN
INTRAVENOUS | Status: AC
  Administered 2016-08-30 – 2016-08-31 (×2): via INTRAVENOUS

## 2016-08-30 MED ORDER — FUROSEMIDE 40 MG PO TABS
40.0000 mg | ORAL_TABLET | Freq: Once | ORAL | Status: AC
  Administered 2016-08-30: 40 mg via ORAL
  Filled 2016-08-30: qty 1

## 2016-08-30 MED ORDER — PRAMIPEXOLE DIHYDROCHLORIDE 1 MG PO TABS
1.0000 mg | ORAL_TABLET | Freq: Three times a day (TID) | ORAL | Status: DC | PRN
  Administered 2016-08-30 – 2016-09-01 (×5): 1 mg via ORAL
  Filled 2016-08-30 (×6): qty 1

## 2016-08-30 MED ORDER — SODIUM CHLORIDE 0.9 % IV BOLUS (SEPSIS)
500.0000 mL | Freq: Once | INTRAVENOUS | Status: AC
  Administered 2016-08-30: 17:00:00 500 mL via INTRAVENOUS

## 2016-08-30 NOTE — Progress Notes (Signed)
[facility] - [place] at [facility]   PATIENT NAME: Misty Medina    MR#:  [id number]  DATE OF BIRTH:  27-Oct-1941  SUBJECTIVE:  CHIEF COMPLAINT:   Chief Complaint  Patient presents with  . Weakness   The patient is 75 year old Caucasian female with past medical history significant for history of depression, fibromyalgia, gastroesophageal reflux disease, hypertension, hypothyroidism, osteoporosis, who presented to the hospital with generalized weakness, black stool for 2 days. She was seen by PCP, and was found to be anemic and sent to emergency room for further evaluation and treatment. No more stools since admission. The patient was transfused for hemoglobin level of 6.7 with 2 units of packed blood cells with improvement of hemoglobin level to 11.5 posttransfusion. She feels comfortable today, denies any pain. Has fever today, has been treated for more than 24 hours with Rocephin for urinary tract infection. Admits of some cough with yellow phlegm production  Review of Systems  Constitutional: Negative for chills, fever and weight loss.  HENT: Negative for congestion.   Eyes: Negative for blurred vision and double vision.  Respiratory: Negative for cough, sputum production, shortness of breath and wheezing.   Cardiovascular: Negative for chest pain, palpitations, orthopnea, leg swelling and PND.  Gastrointestinal: Positive for melena. Negative for abdominal pain, blood in stool, constipation, diarrhea, nausea and vomiting.  Genitourinary: Negative for dysuria, frequency, hematuria and urgency.  Musculoskeletal: Negative for falls.  Neurological: Negative for dizziness, tremors, focal weakness and headaches.  Endo/Heme/Allergies: Does not bruise/bleed easily.  Psychiatric/Behavioral: Negative for depression. The patient does not have insomnia.     VITAL SIGNS: Blood pressure (!) 98/45, pulse 84, temperature 98.3 F (36.8 C), temperature source Oral, resp. rate  18, height 5\' 3"  (1.6 m), weight 74.9 kg (165 lb 2 oz), SpO2 94 %.  PHYSICAL EXAMINATION:   GENERAL:  75 y.o.-year-old patient lying in the bed with no acute distress.  EYES: Pupils equal, round, reactive to light and accommodation. No scleral icterus. Extraocular muscles intact.  HEENT: Head atraumatic, normocephalic. Oropharynx and nasopharynx clear.  NECK:  Supple, no jugular venous distention. No thyroid enlargement, no tenderness.  LUNGS: Diminished air entrance, breath sounds bilaterally, no wheezing, scattered rales,rhonchi , but intermittent scattered crepitation noted in both lung fieldss . No use of accessory muscles of respiration.  CARDIOVASCULAR: S1, S2 normal. No murmurs, rubs, or gallops.  ABDOMEN: Soft, nontender, nondistended. Bowel sounds present. No organomegaly or mass.  EXTREMITIES: No pedal edema, cyanosis, or clubbing.  NEUROLOGIC: Cranial nerves II through XII are intact. Muscle strength 5/5 in all extremities. Sensation intact. Gait not checked.  PSYCHIATRIC: The patient is alert and oriented x 3.  SKIN: No obvious rash, lesion, or ulcer.   ORDERS/RESULTS REVIEWED:   CBC  Recent Labs Lab 08/28/16 1027 08/29/16 0012 08/29/16 0805 08/29/16 2004 08/30/16 0340  WBC 8.6 6.3  --   --   --   HGB 7.4* 6.7*  6.6* 8.7* 11.5* 10.2*  HCT 23.5* 20.5*  --  34.8*  --   PLT 210 197  --   --   --   MCV 99.9 98.4  --   --   --   MCH 31.2 31.8  --   --   --   MCHC 31.3* 32.3  --   --   --   RDW 13.9 13.7  --   --   --   LYMPHSABS 1.4  --   --   --   --  MONOABS 0.4  --   --   --   --   EOSABS 0.4  --   --   --   --   BASOSABS 0.1  --   --   --   --    ------------------------------------------------------------------------------------------------------------------  Chemistries   Recent Labs Lab 08/28/16 1027 08/29/16 0012 08/30/16 0340  NA 137 137  --   K 4.1 4.3  --   CL 105 107  --   CO2 23 23  --   GLUCOSE 101* 77  --   BUN 42* 38*  --   CREATININE  2.50* 2.04* 1.62*  CALCIUM 9.0 8.2*  --   AST 132*  --   --   ALT 88*  --   --   ALKPHOS 213*  --   --   BILITOT 0.9  --   --    ------------------------------------------------------------------------------------------------------------------ estimated creatinine clearance is 29.1 mL/min (by C-G formula based on SCr of 1.62 mg/dL (H)). ------------------------------------------------------------------------------------------------------------------ No results for input(s): TSH, T4TOTAL, T3FREE, THYROIDAB in the last 72 hours.  Invalid input(s): FREET3  Cardiac Enzymes  Recent Labs Lab 08/28/16 1027  TROPONINI <0.03   ------------------------------------------------------------------------------------------------------------------ Invalid input(s): POCBNP ---------------------------------------------------------------------------------------------------------------  RADIOLOGY: [place] Renal  Result Date: 08/29/2016 CLINICAL DATA:  Acute renal failure EXAM: RENAL / URINARY TRACT ULTRASOUND COMPLETE COMPARISON:  04/30/2013 FINDINGS: Right Kidney: Length: 8.7 cm. Echogenicity within normal limits. No mass or hydronephrosis visualized. Left Kidney: Length: 8.5 cm. Echogenicity within normal limits. No mass or hydronephrosis visualized. Bladder: Appears normal for degree of bladder distention. IMPRESSION: 1. No mass or hydronephrosis identified Electronically Signed   By: Signa Kellaylor  Stroud M.D.   On: 08/29/2016 15:01    EKG:  Orders placed or performed during the hospital encounter of 08/28/16  . ED EKG  . ED EKG    ASSESSMENT AND PLAN:  Active Problems:   GIB (gastrointestinal bleeding)  #1. Acute posthemorrhagic anemia,  status post 2 units of packed red blood cell transfusion, hemoglobin level has improved, follow hemoglobin level and transfuse as needed. Stable overnight, no recurrent bleeding  #2. Gastrointestinal bleed  due to gastric ulcer, status post EGD 31st of October 2017 by  Dr. Nedra HaiLee , , continue Protonix intravenously, clear liquid diet  #3. Acute on chronic, likely renal failure, creatinine 1.17 in 2016, however, 2.2-2.5 in 2017, improved with IV fluid administration, packed red blood cell transfusion, urinalysis is markedly abnormal revealing too numerous to count white blood cells, white blood cell clumps, concerning for urinary tract infection, awaiting for urinary  cultures, renal ultrasound was normal, no masses or hydronephrosis .  #4. Essential hypertension, well-controlled on current therapy #5 Pyuria, likely UTIawaiting for culture, continue Rocephin IV #6. Fever, likely due to urinary tract infection, rule out influenza or other bacterial infection, get blood cultures, continue antibiotic therapy, adjust antibiotics depending on culture results.  #7. Hypotension, unable to advance IV fluids due to concerns of congestive heart failure #8 acute on chronic diastolic CHF, repeat echocardiogram, 1 dose of Lasix orally was given today, follow ins and outs and outs and oxygenation, clinically, recheck creatinine in the morning    Management plans discussed with the patient, family and they are in agreement.   DRUG ALLERGIES:  Allergies  Allergen Reactions  . Codeine Nausea And Vomiting  . Penicillins Other (See Comments)    Reaction:  Unknown     CODE STATUS:     Code Status Orders        Start  Ordered   08/28/16 1323  Full code  Continuous     08/28/16 1322    Code Status History    Date Active Date Inactive Code Status Order ID Comments User Context   09/14/2015 10:36 PM 09/16/2015  9:58 PM Full Code [id number]  Oralia Manisavid Willis, MD Inpatient      TOTAL TIME TAKING CARE OF THIS PATIENT: 40 minutes.    Katharina CaperVAICKUTE,Marilyne Haseley M.D on 08/30/2016 at 1:45 PM  Between 7am to 6pm - Pager - [phone]  After 6pm go to [email or web address] - password [email or web address] [facility]  [facility] [facility] [facility]  Office  [phone]  CC: Primary care physician; Luna FuseEJAN-SIE, SHEIKH  AHMED, MD

## 2016-08-30 NOTE — Plan of Care (Signed)
Problem: Health Behavior/Discharge Planning: Goal: Ability to manage health-related needs will improve Outcome: Not Progressing Pt c/o urinary frequency. MD notified. Order given to check UA. Also, pt with expiratory wheezes c/o not being able to breathe. Nebulizer treatment administered by CP. Pt later c/o that she still did not " feel good". Spoke to patient about the need for possible antibiotics for urinary tract infection and chest xray to see if anything is going on. Pt declined further intervention. She said that she did not want to start on any new medicines last night that she might agree to treatment this morning. She took her Ambien and Norc per her request with no further complaints.

## 2016-08-30 NOTE — Progress Notes (Signed)
MD aware of low BP, new orders placed per Dr Winona LegatoVaickute

## 2016-08-31 DIAGNOSIS — K254 Chronic or unspecified gastric ulcer with hemorrhage: Principal | ICD-10-CM

## 2016-08-31 LAB — BASIC METABOLIC PANEL
ANION GAP: 9 (ref 5–15)
BUN: 31 mg/dL — ABNORMAL HIGH (ref 6–20)
CALCIUM: 7.9 mg/dL — AB (ref 8.9–10.3)
CHLORIDE: 102 mmol/L (ref 101–111)
CO2: 24 mmol/L (ref 22–32)
Creatinine, Ser: 1.58 mg/dL — ABNORMAL HIGH (ref 0.44–1.00)
GFR calc non Af Amer: 31 mL/min — ABNORMAL LOW (ref >60)
GFR, EST AFRICAN AMERICAN: 36 mL/min — AB (ref >60)
GLUCOSE: 99 mg/dL (ref 65–99)
POTASSIUM: 3.9 mmol/L (ref 3.5–5.1)
Sodium: 135 mmol/L (ref 135–145)

## 2016-08-31 LAB — HEMOGLOBIN: Hemoglobin: 8.9 g/dL — ABNORMAL LOW (ref 12.0–16.0)

## 2016-08-31 MED ORDER — PANTOPRAZOLE SODIUM 40 MG PO TBEC
40.0000 mg | DELAYED_RELEASE_TABLET | Freq: Two times a day (BID) | ORAL | Status: DC
  Administered 2016-08-31 – 2016-09-02 (×4): 40 mg via ORAL
  Filled 2016-08-31 (×4): qty 1

## 2016-08-31 MED ORDER — MEROPENEM 1 G IV SOLR
1.0000 g | Freq: Two times a day (BID) | INTRAVENOUS | Status: DC
  Administered 2016-08-31 (×2): 1 g via INTRAVENOUS
  Filled 2016-08-31 (×4): qty 1

## 2016-08-31 MED ORDER — AZITHROMYCIN 500 MG PO TABS
500.0000 mg | ORAL_TABLET | Freq: Every day | ORAL | Status: DC
  Filled 2016-08-31: qty 1

## 2016-08-31 NOTE — Progress Notes (Signed)
[facility] - [place] at [facility]   PATIENT NAME: Misty Medina    MR#:  [id number]  DATE OF BIRTH:  10-Jun-1941  SUBJECTIVE:  CHIEF COMPLAINT:   Chief Complaint  Patient presents with  . Weakness   The patient is 75 year old Caucasian female with past medical history significant for history of depression, fibromyalgia, gastroesophageal reflux disease, hypertension, hypothyroidism, osteoporosis, who presented to the hospital with generalized weakness, black stool for 2 days. She was seen by PCP, and was found to be anemic and sent to emergency room for further evaluation and treatment. No more stools since admission. The patient was transfused for hemoglobin level of 6.7 with 2 units of packed blood cells with improvement of hemoglobin level to 11.5 posttransfusion. She feels comfortable today, denies pain, However, is uncomfortable whenever her epigastrium, right upper quadrant is touched/palpated. MAXIMUM TEMPERATURE is 99.4 over the past 24 hours, now on Rocephin and Zithromax. Admits of some yellowish phlegm, overall feels better, wants to eat more. Gastroenterologist recommends to change Protonix to oral. Diet is advanced to full liquid diet today. No more bleeding.      Review of Systems  Constitutional: Negative for chills, fever and weight loss.  HENT: Negative for congestion.   Eyes: Negative for blurred vision and double vision.  Respiratory: Negative for cough, sputum production, shortness of breath and wheezing.   Cardiovascular: Negative for chest pain, palpitations, orthopnea, leg swelling and PND.  Gastrointestinal: Positive for melena. Negative for abdominal pain, blood in stool, constipation, diarrhea, nausea and vomiting.  Genitourinary: Negative for dysuria, frequency, hematuria and urgency.  Musculoskeletal: Negative for falls.  Neurological: Negative for dizziness, tremors, focal weakness and headaches.  Endo/Heme/Allergies: Does not  bruise/bleed easily.  Psychiatric/Behavioral: Negative for depression. The patient does not have insomnia.     VITAL SIGNS: Blood pressure (!) 154/60, pulse 84, temperature 99.4 F (37.4 C), temperature source Oral, resp. rate 20, height 5\' 3"  (1.6 m), weight 74.9 kg (165 lb 2 oz), SpO2 94 %.  PHYSICAL EXAMINATION:   GENERAL:  75 y.o.-year-old patient lying in the bed with no acute distress.  EYES: Pupils equal, round, reactive to light and accommodation. No scleral icterus. Extraocular muscles intact.  HEENT: Head atraumatic, normocephalic. Oropharynx and nasopharynx clear.  NECK:  Supple, no jugular venous distention. No thyroid enlargement, no tenderness.  LUNGS:  relatively good air entrance bilaterally, no wheezing, a few crackles at left base, good air entrance on the right. No rails or wheezing. Not using accessory muscles of respiration.  CARDIOVASCULAR: S1, S2 normal. No murmurs, rubs, or gallops.  ABDOMEN: Soft, Tender in epigastric area and right periumbilical area, not distended. Bowel sounds present. No organomegaly or mass.  EXTREMITIES: No pedal edema, cyanosis, or clubbing.  NEUROLOGIC: Cranial nerves II through XII are intact. Muscle strength 5/5 in all extremities. Sensation intact. Gait not checked.  PSYCHIATRIC: The patient is alert and oriented x 3.  SKIN: No obvious rash, lesion, or ulcer.   ORDERS/RESULTS REVIEWED:   CBC  Recent Labs Lab 08/28/16 1027 08/29/16 0012 08/29/16 0805 08/29/16 2004 08/30/16 0340 08/31/16 0427  WBC 8.6 6.3  --   --   --   --   HGB 7.4* 6.7*  6.6* 8.7* 11.5* 10.2* 8.9*  HCT 23.5* 20.5*  --  34.8*  --   --   PLT 210 197  --   --   --   --   MCV 99.9 98.4  --   --   --   --  MCH 31.2 31.8  --   --   --   --   MCHC 31.3* 32.3  --   --   --   --   RDW 13.9 13.7  --   --   --   --   LYMPHSABS 1.4  --   --   --   --   --   MONOABS 0.4  --   --   --   --   --   EOSABS 0.4  --   --   --   --   --   BASOSABS 0.1  --   --   --   --    --    ------------------------------------------------------------------------------------------------------------------  Chemistries   Recent Labs Lab 08/28/16 1027 08/29/16 0012 08/30/16 0340 08/30/16 1632 08/31/16 0427  NA 137 137  --   --  135  K 4.1 4.3  --   --  3.9  CL 105 107  --   --  102  CO2 23 23  --   --  24  GLUCOSE 101* 77  --   --  99  BUN 42* 38*  --   --  31*  CREATININE 2.50* 2.04* 1.62*  --  1.58*  CALCIUM 9.0 8.2*  --   --  7.9*  MG  --   --   --  2.1  --   AST 132*  --   --   --   --   ALT 88*  --   --   --   --   ALKPHOS 213*  --   --   --   --   BILITOT 0.9  --   --   --   --    ------------------------------------------------------------------------------------------------------------------ estimated creatinine clearance is 29.8 mL/min (by C-G formula based on SCr of 1.58 mg/dL (H)). ------------------------------------------------------------------------------------------------------------------ No results for input(s): TSH, T4TOTAL, T3FREE, THYROIDAB in the last 72 hours.  Invalid input(s): FREET3  Cardiac Enzymes  Recent Labs Lab 08/28/16 1027  TROPONINI <0.03   ------------------------------------------------------------------------------------------------------------------ Invalid input(s): POCBNP ---------------------------------------------------------------------------------------------------------------  RADIOLOGY: [place] Renal  Result Date: 08/29/2016 CLINICAL DATA:  Acute renal failure EXAM: RENAL / URINARY TRACT ULTRASOUND COMPLETE COMPARISON:  04/30/2013 FINDINGS: Right Kidney: Length: 8.7 cm. Echogenicity within normal limits. No mass or hydronephrosis visualized. Left Kidney: Length: 8.5 cm. Echogenicity within normal limits. No mass or hydronephrosis visualized. Bladder: Appears normal for degree of bladder distention. IMPRESSION: 1. No mass or hydronephrosis identified Electronically Signed   By: Signa Kellaylor  Stroud M.D.   On:  08/29/2016 15:01   Dg Chest Port 1 View  Result Date: 08/30/2016 CLINICAL DATA:  CHF EXAM: PORTABLE CHEST 1 VIEW COMPARISON:  08/28/2016 FINDINGS: Cardiomegaly. Patchy airspace disease throughout the left lung. Right lung is clear. No visible effusion. No acute bony abnormality. IMPRESSION: Cardiomegaly. Patchy left lung airspace disease concerning for pneumonia. Electronically Signed   By: Charlett NoseKevin  Dover M.D.   On: 08/30/2016 17:01    EKG:  Orders placed or performed during the hospital encounter of 08/28/16  . ED EKG  . ED EKG    ASSESSMENT AND PLAN:  Active Problems:   GIB (gastrointestinal bleeding)  #1. Acute posthemorrhagic anemia,  status post 2 units of packed red blood cell transfusion, hemoglobin level has improved, follow hemoglobin level and transfuse as needed. Stable overnight, no recurrent bleeding  #2. Gastrointestinal bleed  due to gastric ulcer, status post EGD 31st of October 2017 by Dr. Nedra HaiLee , Pathology report reveals chronic  active gastritis, no dysplasia or malignancy , continue Protonix orally, initiate full liquid diet.  #3 Acute on chronic renal failure, creatinine 1.17 in 2016, however, 2.2-2.5 in 2017, improved with IV fluid administration, packed red blood cell transfusion, urinalysis was markedly abnormal revealing too numerous to count white blood cells, white blood cell clumps, concerning for urinary tract infection, awaiting for urinary  cultures,  on 100,000 colony-forming units of gram-negative rods, renal ultrasound was normal, no masses or hydronephrosis .  Creatinine is improving #4. Essential hypertension, relatively well-controlled on current therapy #5  urinary tract infection due to gram-negative rods, . Continue antibiotics intravenously, awaiting for urinary culture results, sensitivities  #6. Fever, due to urinary tract infection and left lung pneumonia, blood cultures are negative so far less than 24 hours continue Zosyn therapy, adjust antibiotics  depending on culture results.  #7. Hypotension,  resolved, discontinue IV fluids due to concerns of congestive heart failure #8  bacterial pneumonia, concerning for aspiration pneumonitis due to recent gastrointestinal procedure, change Rocephin to Zosyn. Get sputum cultures if possible    Management plans discussed with the patient, family and they are in agreement.   DRUG ALLERGIES:  Allergies  Allergen Reactions  . Codeine Nausea And Vomiting  . Penicillins Other (See Comments)    Reaction:  Unknown     CODE STATUS:     Code Status Orders        Start     Ordered   08/28/16 1323  Full code  Continuous     08/28/16 1322    Code Status History    Date Active Date Inactive Code Status Order ID Comments User Context   09/14/2015 10:36 PM 09/16/2015  9:58 PM Full Code [id number]  Oralia Manisavid Willis, MD Inpatient      TOTAL TIME TAKING CARE OF THIS PATIENT: 40 minutes.    Katharina CaperVAICKUTE,Friend Dorfman M.D on 08/31/2016 at 2:03 PM  Between 7am to 6pm - Pager - [phone]  After 6pm go to [email or web address] - password [email or web address] [facility]  [facility]  Office  [phone]  CC: Primary care physician; Luna FuseEJAN-SIE, SHEIKH AHMED, MD

## 2016-08-31 NOTE — Progress Notes (Signed)
Pharmacy Antibiotic Note  Misty Medina is a 75 y.o. female admitted on 08/28/2016 now with aspiration pneumonia.  Pharmacy has been consulted for meropenem dosing.  Pt initially ordered Zosyn. Pt has PCN allergy listed in chart with unknown reaction.  Called pt's room and pt stated "I don't know unless I makes me nauseated" when asked what her reaction to PCN is. Asked if pt remembers SOB or trouble breathing, which she denied. Pt has been receiving ceftriaxone this admission. Discussed with hospitalist, will try meropenem.   Plan: Meropenem 1 g IV q12h based on renal function.   Height: 5\' 3"  (160 cm) Weight: 165 lb 2 oz (74.9 kg) IBW/kg (Calculated) : 52.4  Temp (24hrs), Avg:99.1 F (37.3 C), Min:98.6 F (37 C), Max:99.4 F (37.4 C)   Recent Labs Lab 08/28/16 1027 08/29/16 0012 08/30/16 0340 08/31/16 0427  WBC 8.6 6.3  --   --   CREATININE 2.50* 2.04* 1.62* 1.58*    Estimated Creatinine Clearance: 29.8 mL/min (by C-G formula based on SCr of 1.58 mg/dL (H)).    Allergies  Allergen Reactions  . Codeine Nausea And Vomiting  . Penicillins Other (See Comments)    Reaction:  Unknown     Antimicrobials this admission: CTX 11/1 >>11/2 Azithromycin 11/1 >> Meropenem 11/2>>  Dose adjustments this admission:   Microbiology results: 11/1 BCx: NGTD 10/31 UCx: >100k GNR   Thank you for allowing pharmacy to be a part of this patient's care.  Marty HeckWang, Jakoby Melendrez L 08/31/2016 2:31 PM

## 2016-08-31 NOTE — Progress Notes (Signed)
Wyline MoodKiran Lamarr Feenstra MD [address] [place], [place] [address] Phone: [phone] Fax : [phone]  Tera PartridgeBrenda G Gwyn is being followed for GI bleed  Day 4 of follow up   Subjective:  Denies any bleeding, no bowel movements either.   Objective: Vital signs in last 24 hours: Vitals:   08/30/16 1748 08/30/16 2001 08/31/16 0547 08/31/16 0800  BP: (!) 90/54 (!) 106/55 (!) 113/58 (!) 154/60  Pulse: 76 81 84 84  Resp:  20 20 20   Temp:  98.6 F (37 C) 99.4 F (37.4 C) 99.4 F (37.4 C)  TempSrc:  Oral Oral Oral  SpO2:  95% 90% 94%  Weight:      Height:       Weight change:   Intake/Output Summary (Last 24 hours) at 08/31/16 0954 Last data filed at 08/31/16 0849  Gross per 24 hour  Intake           1197.5 ml  Output              200 ml  Net            997.5 ml     Exam: Heart:: S1S2 present Lungs: clear to auscultation Abdomen: soft, nontender, normal bowel sounds   Lab Results: @LABTEST2 @ Micro Results: Recent Results (from the past 240 hour(s))  Urine culture     Status: Abnormal (Preliminary result)   Collection Time: 08/29/16  2:01 PM  Result Value Ref Range Status   Specimen Description URINE, CATHETERIZED  Final   Special Requests NONE  Final   Culture >=100,000 COLONIES/mL GRAM NEGATIVE RODS (A)  Final   Report Status PENDING  Incomplete  CULTURE, BLOOD (ROUTINE X 2) w Reflex to ID Panel     Status: None (Preliminary result)   Collection Time: 08/30/16  2:08 PM  Result Value Ref Range Status   Specimen Description BLOOD R HAND  Final   Special Requests BOTTLES DRAWN AEROBIC AND ANAEROBIC 4ML  Final   Culture NO GROWTH < 24 HOURS  Final   Report Status PENDING  Incomplete  CULTURE, BLOOD (ROUTINE X 2) w Reflex to ID Panel     Status: None (Preliminary result)   Collection Time: 08/30/16  2:18 PM  Result Value Ref Range Status   Specimen Description BLOOD L HAND  Final   Special Requests   Final    BOTTLES DRAWN AEROBIC AND ANAEROBIC AER 4ML ANA 3ML     Culture NO GROWTH < 24 HOURS  Final   Report Status PENDING  Incomplete   Studies/Results: [place] Renal  Result Date: 08/29/2016 CLINICAL DATA:  Acute renal failure EXAM: RENAL / URINARY TRACT ULTRASOUND COMPLETE COMPARISON:  04/30/2013 FINDINGS: Right Kidney: Length: 8.7 cm. Echogenicity within normal limits. No mass or hydronephrosis visualized. Left Kidney: Length: 8.5 cm. Echogenicity within normal limits. No mass or hydronephrosis visualized. Bladder: Appears normal for degree of bladder distention. IMPRESSION: 1. No mass or hydronephrosis identified Electronically Signed   By: Signa Kellaylor  Stroud M.D.   On: 08/29/2016 15:01   Dg Chest Port 1 View  Result Date: 08/30/2016 CLINICAL DATA:  CHF EXAM: PORTABLE CHEST 1 VIEW COMPARISON:  08/28/2016 FINDINGS: Cardiomegaly. Patchy airspace disease throughout the left lung. Right lung is clear. No visible effusion. No acute bony abnormality. IMPRESSION: Cardiomegaly. Patchy left lung airspace disease concerning for pneumonia. Electronically Signed   By: Charlett NoseKevin  Dover M.D.   On: 08/30/2016 17:01   Medications: I have reviewed the patient's current medications. Scheduled Meds: . azithromycin  500 mg Intravenous Q24H  . busPIRone  15 mg Oral BID  . carvedilol  6.25 mg Oral BID WC  . cefTRIAXone  1 g Intravenous q1800  . citalopram  20 mg Oral Daily  . ferrous fumarate-b12-vitamic C-folic acid  1 capsule Oral Daily  . fesoterodine  8 mg Oral Daily  . Influenza vac split quadrivalent PF  0.5 mL Intramuscular Tomorrow-1000  . levothyroxine  75 mcg Oral QAC breakfast  . loratadine  10 mg Oral Daily  . magnesium oxide  400 mg Oral Daily  . pantoprazole (PROTONIX) IV  40 mg Intravenous Q12H  . pregabalin  25 mg Oral BID   Continuous Infusions: . sodium chloride 75 mL/hr at 08/31/16 0811   PRN Meds:.acetaminophen **OR** acetaminophen, albuterol, HYDROcodone-acetaminophen, ondansetron **OR** ondansetron (ZOFRAN) IV, pramipexole,  zolpidem   Assessment: Active Problems:   GIB (gastrointestinal bleeding)  She was initially seen by Dr Nedra HaiLee on 08/28/2016 for anemia and weakness. Heme positive stool.  She underwent an EGD on 08/29/16 -found to have a hiatal hernia , non bleeding gastric ulcer 10 mm in size in the gastric fundus. Biopsy was taken. On admission Hb 6.7 , received 2 units of PRBC and improved to 11.5 grams. ( on admission her creatinine was 2.5 and over next two days dropped to 1.62). Today Hb is 8.9 with creatinine of 1.58 . On admission abnormal LFT also noted with normal bilirubin.   Plan:  1. GI bleed - no overt bleeding . Her Hb initially went upto 11.5 spuriously probably due to dehydration and hemoconcentration and has drfited down since with her creatinine trending down . Has appropriately risen 2 grams since initial check at 6.7 grams and now is 8.9 grams. Watch for further bleeding . Can change to oral protonix. Follow up biopsies of gastric ulcer.      LOS: 3 days   Wyline MoodKiran Brookley Spitler 08/31/2016, 9:54 AM

## 2016-09-01 LAB — URINE CULTURE: Culture: >=100000 — AB

## 2016-09-01 LAB — SURGICAL PATHOLOGY

## 2016-09-01 LAB — CREATININE, SERUM
Creatinine, Ser: 1.27 mg/dL — ABNORMAL HIGH (ref 0.44–1.00)
GFR, EST AFRICAN AMERICAN: 47 mL/min — AB (ref >60)
GFR, EST NON AFRICAN AMERICAN: 40 mL/min — AB (ref >60)

## 2016-09-01 LAB — HEMOGLOBIN: Hemoglobin: 8.9 g/dL — ABNORMAL LOW (ref 12.0–16.0)

## 2016-09-01 MED ORDER — POLYETHYLENE GLYCOL 3350 17 G PO PACK
17.0000 g | PACK | Freq: Every day | ORAL | Status: DC
  Administered 2016-09-01: 16:00:00 17 g via ORAL
  Filled 2016-09-01: qty 1

## 2016-09-01 MED ORDER — IPRATROPIUM-ALBUTEROL 0.5-2.5 (3) MG/3ML IN SOLN
3.0000 mL | RESPIRATORY_TRACT | Status: DC
  Administered 2016-09-01 – 2016-09-02 (×8): 3 mL via RESPIRATORY_TRACT
  Filled 2016-09-01 (×9): qty 3

## 2016-09-01 MED ORDER — BUDESONIDE 0.25 MG/2ML IN SUSP
0.2500 mg | Freq: Two times a day (BID) | RESPIRATORY_TRACT | Status: DC
  Administered 2016-09-01 – 2016-09-02 (×3): 0.25 mg via RESPIRATORY_TRACT
  Filled 2016-09-01 (×4): qty 2

## 2016-09-01 MED ORDER — PROMETHAZINE HCL 25 MG/ML IJ SOLN
12.5000 mg | INTRAMUSCULAR | Status: DC | PRN
  Administered 2016-09-01: 17:00:00 12.5 mg via INTRAVENOUS
  Filled 2016-09-01: qty 1

## 2016-09-01 MED ORDER — MEROPENEM-SODIUM CHLORIDE 1 GM/50ML IV SOLR
1.0000 g | Freq: Two times a day (BID) | INTRAVENOUS | Status: DC
  Administered 2016-09-01 – 2016-09-02 (×2): 1 g via INTRAVENOUS
  Filled 2016-09-01 (×4): qty 50

## 2016-09-01 NOTE — Progress Notes (Signed)
Dr. Winona LegatoVaickute notified patient c/o nausea and non-productive cough. Verbal order read back and verified for diet change back to full liquids and phenergan 12.5mg  Q4H PRN. Will continue to monitor.

## 2016-09-01 NOTE — Progress Notes (Signed)
[facility] - [place] [facility]   PATIENT NAME: Misty SloopBrenda Medina    MR#:  [id number]  DATE OF BIRTH:  12/16/1940  SUBJECTIVE:  CHIEF COMPLAINT:   Chief Complaint  Patient presents with  . Weakness   The patient is 75 year old Caucasian female with past medical history significant for history of depression, fibromyalgia, gastroesophageal reflux disease, hypertension, hypothyroidism, osteoporosis, who presented to the hospital with generalized weakness, black stool for 2 days. She was seen by PCP, and was found to be anemic and sent to emergency room for further evaluation and treatment. No more stools since admission. The patient was transfused for hemoglobin level of 6.7 with 2 units of packed blood cells with improvement of hemoglobin level to 11.5 posttransfusion. The patient feels comfortable today, denies any significant abdominal pain, would like to go home, however, patient's temperature is fluctuating, her O2 sats only 90-91% on room air. Hemoglobin level remains relatively stable per gastroenterologist, hemoglobin has risen 2 g with when it was transfused for hemoglobin level of 6.7 and now 8.9, and remained stable, no recurrent bleeding.     Review of Systems  Constitutional: Negative for chills, fever and weight loss.  HENT: Negative for congestion.   Eyes: Negative for blurred vision and double vision.  Respiratory: Negative for cough, sputum production, shortness of breath and wheezing.   Cardiovascular: Negative for chest pain, palpitations, orthopnea, leg swelling and PND.  Gastrointestinal: Positive for melena. Negative for abdominal pain, blood in stool, constipation, diarrhea, nausea and vomiting.  Genitourinary: Negative for dysuria, frequency, hematuria and urgency.  Musculoskeletal: Negative for falls.  Neurological: Negative for dizziness, tremors, focal weakness and headaches.  Endo/Heme/Allergies: Does not bruise/bleed easily.   Psychiatric/Behavioral: Negative for depression. The patient does not have insomnia.     VITAL SIGNS: Blood pressure (!) 142/57, pulse 84, temperature 99.6 F (37.6 C), temperature source Oral, resp. rate (!) 24, height 5\' 3"  (1.6 m), weight 74.9 kg (165 lb 2 oz), SpO2 91 %.  PHYSICAL EXAMINATION:   GENERAL:  75 y.o.-year-old patient lying in the bed with no acute distress.  EYES: Pupils equal, round, reactive to light and accommodation. No scleral icterus. Extraocular muscles intact.  HEENT: Head atraumatic, normocephalic. Oropharynx and nasopharynx clear.  NECK:  Supple, no jugular venous distention. No thyroid enlargement, no tenderness.  LUNGS:  Diminished entrance bilaterally, scattered wheezing, rales, rhonchi, crackles half lung field posteriorly, good air entrance on the right. Intermittently using accessory muscles of respiration.  CARDIOVASCULAR: S1, S2 normal. No murmurs, rubs, or gallops.  ABDOMEN: Soft, no significant tenderness  in epigastric and right periumbilical area,  just mild discomfort on palpation, not distended. Bowel sounds present. No organomegaly or mass.  EXTREMITIES: No pedal edema, cyanosis, or clubbing.  NEUROLOGIC: Cranial nerves II through XII are intact. Muscle strength 5/5 in all extremities. Sensation intact. Gait not checked.  PSYCHIATRIC: The patient is alert and oriented x 3.  SKIN: No obvious rash, lesion, or ulcer.   ORDERS/RESULTS REVIEWED:   CBC  Recent Labs Lab 08/28/16 1027 08/29/16 0012 08/29/16 0805 08/29/16 2004 08/30/16 0340 08/31/16 0427 09/01/16 0449  WBC 8.6 6.3  --   --   --   --   --   HGB 7.4* 6.7*  6.6* 8.7* 11.5* 10.2* 8.9* 8.9*  HCT 23.5* 20.5*  --  34.8*  --   --   --   PLT 210 197  --   --   --   --   --  MCV 99.9 98.4  --   --   --   --   --   MCH 31.2 31.8  --   --   --   --   --   MCHC 31.3* 32.3  --   --   --   --   --   RDW 13.9 13.7  --   --   --   --   --   LYMPHSABS 1.4  --   --   --   --   --   --    MONOABS 0.4  --   --   --   --   --   --   EOSABS 0.4  --   --   --   --   --   --   BASOSABS 0.1  --   --   --   --   --   --    ------------------------------------------------------------------------------------------------------------------  Chemistries   Recent Labs Lab 08/28/16 1027 08/29/16 0012 08/30/16 0340 08/30/16 1632 08/31/16 0427 09/01/16 0449  NA 137 137  --   --  135  --   K 4.1 4.3  --   --  3.9  --   CL 105 107  --   --  102  --   CO2 23 23  --   --  24  --   GLUCOSE 101* 77  --   --  99  --   BUN 42* 38*  --   --  31*  --   CREATININE 2.50* 2.04* 1.62*  --  1.58* 1.27*  CALCIUM 9.0 8.2*  --   --  7.9*  --   MG  --   --   --  2.1  --   --   AST 132*  --   --   --   --   --   ALT 88*  --   --   --   --   --   ALKPHOS 213*  --   --   --   --   --   BILITOT 0.9  --   --   --   --   --    ------------------------------------------------------------------------------------------------------------------ estimated creatinine clearance is 37.1 mL/min (by C-G formula based on SCr of 1.27 mg/dL (H)). ------------------------------------------------------------------------------------------------------------------ No results for input(s): TSH, T4TOTAL, T3FREE, THYROIDAB in the last 72 hours.  Invalid input(s): FREET3  Cardiac Enzymes  Recent Labs Lab 08/28/16 1027  TROPONINI <0.03   ------------------------------------------------------------------------------------------------------------------ Invalid input(s): POCBNP ---------------------------------------------------------------------------------------------------------------  RADIOLOGY: Dg Chest Port 1 View  Result Date: 08/30/2016 CLINICAL DATA:  CHF EXAM: PORTABLE CHEST 1 VIEW COMPARISON:  08/28/2016 FINDINGS: Cardiomegaly. Patchy airspace disease throughout the left lung. Right lung is clear. No visible effusion. No acute bony abnormality. IMPRESSION: Cardiomegaly. Patchy left lung airspace disease  concerning for pneumonia. Electronically Signed   By: Charlett NoseKevin  Dover M.D.   On: 08/30/2016 17:01    EKG:  Orders placed or performed during the hospital encounter of 08/28/16  . ED EKG  . ED EKG    ASSESSMENT AND PLAN:  Active Problems:   GIB (gastrointestinal bleeding)  #1. Acute posthemorrhagic anemia,  status post 2 units of packed red blood cell transfusion, hemoglobin level has improved and remains stable, follow hemoglobin level and transfuse as needed. No recurrent bleeding  #2. Gastrointestinal bleed  due to gastric ulcer, status post EGD 31st of October 2017 by Dr. Nedra HaiLee , Pathology report reveals chronic active gastritis,  no dysplasia or malignancy , continue Protonix orally, advance full liquid diet to soft, continue PPI.   #3 Acute on chronic renal failure, creatinine 1.17 in 2016, however, 2.2-2.5 in 2017, improved with IV fluid administration, packed red blood cell transfusion, low in the morning. Renal ultrasound was normal, no masses or hydronephrosis .  Creatinine is improvingg #4 urinary tract infection due to Escherichia coli ,  continue meropenem . #5. Essential hypertension, relatively well-controlled on current therapy #6. Aspiration pneumonitis. Postprocedure, blood cultures are negative, continue meropenem . Follow clinically. Oxygenation is low, 91% on room air  #7. Hypotension,  resolved, off IV fluids due to concerns of congestive heart failure    Management plans discussed with the patient, family and they are in agreement.   DRUG ALLERGIES:  Allergies  Allergen Reactions  . Codeine Nausea And Vomiting  . Penicillins Other (See Comments)    Reaction:  Unknown     CODE STATUS:     Code Status Orders        Start     Ordered   08/28/16 1323  Full code  Continuous     08/28/16 1322    Code Status History    Date Active Date Inactive Code Status Order ID Comments User Context   09/14/2015 10:36 PM 09/16/2015  9:58 PM Full Code [id number]  Oralia Manis, MD Inpatient      TOTAL TIME TAKING CARE OF THIS PATIENT: 40 minutes.    Katharina Caper M.D on 09/01/2016 at 12:31 PM  Between 7am to 6pm - Pager - [phone]  After 6pm go to [email or web address] - password [email or web address] [facility]  [place] [facility]  Office  [phone]  CC: Primary care physician; Luna Fuse, MD

## 2016-09-01 NOTE — Care Management (Signed)
Admitted to [facility] with the diagnosis of GI bleed. Cipriano BunkerGrandson, Justin Smith, lives with her x 1 year [phone]). Daughter/POA  is Olegario MessierKathy, [phone]). [facility] and another agency in the past. "Can't remember the name." [facility] last 09/16/15. [facility] in the past. [facility] in the past. No home oxygen. Life Alert in the home. Rolling walker, cane, nebulizer, bedside chair, and shower chair in the home. Lived at [facility] x 3 months in the past. Good appetite for the last week. Last fall was 6 months ago. Takes care of all basic activities of daily living herself, drives. Family will transport. Gwenette GreetBrenda S Christianna Belmonte RN MSN CCM Care Management [phone]

## 2016-09-01 NOTE — Care Management Important Message (Signed)
Important Message  Patient Details  Name: Misty Medina MRN: [id number] Date of Birth: 06/11/1941   Medicare Important Message Given:  Yes    Gwenette GreetBrenda S Khing Belcher, RN 09/01/2016, 11:04 AM

## 2016-09-02 DIAGNOSIS — D62 Acute posthemorrhagic anemia: Secondary | ICD-10-CM | POA: Diagnosis present

## 2016-09-02 DIAGNOSIS — J690 Pneumonitis due to inhalation of food and vomit: Secondary | ICD-10-CM | POA: Diagnosis present

## 2016-09-02 DIAGNOSIS — B9620 Unspecified Escherichia coli [E. coli] as the cause of diseases classified elsewhere: Secondary | ICD-10-CM | POA: Diagnosis present

## 2016-09-02 DIAGNOSIS — R0902 Hypoxemia: Secondary | ICD-10-CM | POA: Diagnosis present

## 2016-09-02 DIAGNOSIS — N390 Urinary tract infection, site not specified: Secondary | ICD-10-CM

## 2016-09-02 DIAGNOSIS — K259 Gastric ulcer, unspecified as acute or chronic, without hemorrhage or perforation: Secondary | ICD-10-CM | POA: Diagnosis present

## 2016-09-02 MED ORDER — LEVOFLOXACIN 500 MG PO TABS: 500.0000 mg | ORAL_TABLET | Freq: Every day | ORAL | 0 refills | Status: DC

## 2016-09-02 MED ORDER — ONDANSETRON HCL 4 MG PO TABS: 4.0000 mg | ORAL_TABLET | Freq: Four times a day (QID) | ORAL | 0 refills | Status: AC | PRN

## 2016-09-02 MED ORDER — PANTOPRAZOLE SODIUM 40 MG PO TBEC: 40.0000 mg | DELAYED_RELEASE_TABLET | Freq: Two times a day (BID) | ORAL | 0 refills | Status: AC

## 2016-09-02 MED ORDER — LACTULOSE 10 GM/15ML PO SOLN
30.0000 g | Freq: Once | ORAL | Status: DC
  Filled 2016-09-02: qty 60

## 2016-09-02 MED ORDER — BISACODYL 10 MG RE SUPP: 10.0000 mg | Freq: Every day | RECTAL | Status: DC | PRN

## 2016-09-02 MED ORDER — SUCRALFATE 1 GM/10ML PO SUSP
1.0000 g | Freq: Three times a day (TID) | ORAL | Status: DC
  Administered 2016-09-02 (×2): 1 g via ORAL
  Filled 2016-09-02 (×2): qty 10

## 2016-09-02 NOTE — Discharge Instructions (Addendum)
Soft bland diet for 2 days and then advance to usual diet.  Activity as tolerated

## 2016-09-02 NOTE — Evaluation (Signed)
Physical Therapy Evaluation Patient Details Name: Misty PartridgeBrenda G Hall MRN: [id number] DOB: 12/30/1940 Today's Date: 09/02/2016   History of Present Illness  presented to ER secondary to generalized weakness, black stools x2 days; admitted with upper GIB with symptomatic anemia secondary to gastric ulcer.  Clinical Impression  Upon evaluation, patient alert and oriented; follows all commands and demonstrates fair insight/safety awareness.  Bilat UE/LE grossly WFL and symmetrical, though generally weak and deconditioned due to acute illness; no focal weakness appreciated.  Able to complete bed mobility with mod indep; sit/stand, basic transfers and gait (25') without assist device, cga/close sup; improved to close sup with use of RW (125').  Patient subjectively reporting increase comfort/confidence with use of RW; do recommend continued use at discharge. Would benefit from skilled PT to address above deficits and promote optimal return to PLOF; Recommend transition to HHPT upon discharge from acute hospitalization.     Follow Up Recommendations Home health PT    Equipment Recommendations       Recommendations for Other Services       Precautions / Restrictions Precautions Precautions: Fall Restrictions Weight Bearing Restrictions: No      Mobility  Bed Mobility Overal bed mobility: Modified Independent                Transfers Overall transfer level: Needs assistance Equipment used: Rolling walker (2 wheeled) Transfers: Sit to/from Stand Sit to Stand: Supervision         General transfer comment: broad BOS, increased UE assist required for lower seating surfaces  Ambulation/Gait Ambulation/Gait assistance: Min guard Ambulation Distance (Feet): 25 Feet Assistive device: None       General Gait Details: broad, slightly staggering BOS; constantly reaching for walls/furniture for external stabilization  Stairs            Wheelchair Mobility    Modified  Rankin (Stroke Patients Only)       Balance Overall balance assessment: Needs assistance Sitting-balance support: No upper extremity supported;Feet supported Sitting balance-Leahy Scale: Good     Standing balance support: No upper extremity supported Standing balance-Leahy Scale: Fair                               Pertinent Vitals/Pain Pain Assessment: No/denies pain    Home Living Family/patient expects to be discharged to:: Private residence Living Arrangements: Other relatives (grandson) Available Help at Discharge: Family;Available PRN/intermittently (patient home alone while grandson works)   Home Access: Stairs to enter Entrance Stairs-Rails: Right Secretary/administratorntrance Stairs-Number of Steps: 5 Home Layout: One level Home Equipment: Environmental consultantWalker - 2 wheels;Bedside commode;Shower seat - built in;Cane - single point      Prior Function Level of Independence: Independent         Comments: Indep with ADLs and household mobility without assist device at baseline; intermittent use of RW as needed when 'feeling weak'     Hand Dominance        Extremity/Trunk Assessment   Upper Extremity Assessment: Generalized weakness           Lower Extremity Assessment: Generalized weakness (grossly at least 4-/5 throughout)         Communication   Communication: No difficulties  Cognition Arousal/Alertness: Awake/alert Behavior During Therapy: WFL for tasks assessed/performed Overall Cognitive Status: Within Functional Limits for tasks assessed                      General Comments  Exercises Other Exercises Other Exercises: 125' with RW, sup--improved symmetry and fluidity with use of RW; patient subjectively reporting improved comfort/confidence with use of RW Other Exercises: Toilet transfer, ambulatory without assist device, cga/close sup; sit/stand from standard toilet with grab bar, close sup (heavy use of UEs to pull on grab bar); standing balance  for clothing management and hygiene, close sup.   Assessment/Plan    PT Assessment Patient needs continued PT services  PT Problem List Decreased strength;Decreased activity tolerance;Decreased balance;Decreased mobility          PT Treatment Interventions DME instruction;Stair training;Functional mobility training;Gait training;Therapeutic activities;Therapeutic exercise;Balance training;Patient/family education    PT Goals (Current goals can be found in the Care Plan section)  Acute Rehab PT Goals Patient Stated Goal: I hope to go home today PT Goal Formulation: With patient Time For Goal Achievement: 09/16/16 Potential to Achieve Goals: Good    Frequency Min 2X/week   Barriers to discharge Decreased caregiver support      Co-evaluation               End of Session Equipment Utilized During Treatment: Gait belt Activity Tolerance: Patient tolerated treatment well Patient left: in bed;with call bell/phone within reach;with bed alarm set           Time: [id number] PT Time Calculation (min) (ACUTE ONLY): 21 min   Charges:   PT Evaluation $PT Eval Low Complexity: 1 Procedure PT Treatments $Gait Training: 8-22 mins   PT G Codes:        Amorina Doerr H. Manson PasseyBrown, PT, DPT, NCS 09/02/16, 1:44 PM [phone]

## 2016-09-02 NOTE — Progress Notes (Signed)
MD making rounds. Received order to discharge home. IV removed. Patient provided with Education Handout. Prescriptions E-Scribed to pharmacy. Discharge paperwork provided, explained, signed and witnessed. No unanswered questions. Discharged via wheelchair by nursing staff. Belongings sent with patient and family.

## 2016-09-04 LAB — CULTURE, BLOOD (ROUTINE X 2)
CULTURE: NO GROWTH
CULTURE: NO GROWTH

## 2016-09-11 NOTE — Discharge Summary (Signed)
[facility] [place] [facility]   PATIENT NAME: Misty SloopBrenda Trostel    MR#:  [id number]  DATE OF BIRTH:  08/18/1941  DATE OF ADMISSION:  08/28/2016 ADMITTING PHYSICIAN: Shaune PollackQing Chen, MD  DATE OF DISCHARGE: 09/02/2016  7:30 PM  PRIMARY CARE PHYSICIAN: Luna FuseEJAN-SIE, SHEIKH AHMED, MD   ADMISSION DIAGNOSIS:  Gastrointestinal hemorrhage, unspecified gastrointestinal hemorrhage type [K92.2]  DISCHARGE DIAGNOSIS:  Active Problems:   Constipation   GIB (gastrointestinal bleeding)   Gastric ulcer   E. coli UTI   Aspiration pneumonia (HCC)   Acute blood loss anemia   Hypoxia   SECONDARY DIAGNOSIS:   Past Medical History:  Diagnosis Date  . Depression   . Fibromyalgia   . GERD (gastroesophageal reflux disease)   . Heart murmur   . Hypertension   . Hypothyroidism   . Osteoporosis      ADMITTING HISTORY  Misty SloopBrenda Herrington  is a 75 y.o. female with a known history of Hypertension, hypothyroidism and osteoporosis. The patient has had generalized weakness for the Past a few days. She noticed that she has had black stool for the past 2 days. He went to PCPs office and found anemia. She was sent to the ED for further evaluation. Hemoglobin decreased to 7.4. her baseline hemoglobin was 11.5 last year. Has been taking baclofen for unknown reason.  HOSPITAL COURSE:   #1. Acute posthemorrhagic anemia,  status post 2 units of packed red blood cell transfusion, hemoglobin level has improved and remains stable, No recurrent bleeding \  #2. Gastrointestinal bleed  due to gastric ulcer, status post EGD 31st of October 2017 by Dr. Nedra HaiLee , Pathology report reveals chronic active gastritis, no dysplasia or malignancy , continue Protonix orally, Diet advanced and patient tolerating it well.  #3 Acute on chronic renal failure, creatinine 1.17 in 2016, however, 2.2-2.5 in 2017, improved with IV fluid administration, packed red blood cell transfusion,  Renal ultrasound was normal, no masses or  hydronephrosis .  Creatinine is improving  #4 urinary tract infection due to Escherichia coli ,  continue meropenem in the hospital. Switch to oral at discharge.  #5. Essential hypertension, relatively well-controlled on current therapy  #6. Aspiration pneumonitis. Postprocedure, blood cultures are negative, continue meropenem . Follow clinically. Oxygenation is  91% on room air  Treated with oral antibiotics after discharge  #7. Hypotension,  resolved, off IV fluids due to concerns of congestive heart failure  Stable for discharge home with home health  CONSULTS OBTAINED:  Treatment Team:  Ula Lingoichard Lee, MD Wyline MoodKiran Anna, MD  DRUG ALLERGIES:   Allergies  Allergen Reactions  . Codeine Nausea And Vomiting  . Penicillins Other (See Comments)    Reaction:  Unknown     DISCHARGE MEDICATIONS:   Discharge Medication List as of 09/02/2016  6:36 PM    START taking these medications   Details  levofloxacin (LEVAQUIN) 500 MG tablet Take 1 tablet (500 mg total) by mouth daily., Starting Sat 09/02/2016, Normal    ondansetron (ZOFRAN) 4 MG tablet Take 1 tablet (4 mg total) by mouth every 6 (six) hours as needed for nausea., Starting Sat 09/02/2016, Normal      CONTINUE these medications which have CHANGED   Details  pantoprazole (PROTONIX) 40 MG tablet Take 1 tablet (40 mg total) by mouth 2 (two) times daily before a meal., Starting Sat 09/02/2016, Normal      CONTINUE these medications which have NOT CHANGED   Details  b complex vitamins tablet Take 1 tablet by  mouth daily., Historical Med    baclofen (LIORESAL) 10 MG tablet Take 10 mg by mouth 3 (three) times daily., Historical Med    Biotin 1000 MCG tablet Take 1,000 mcg by mouth daily., Historical Med    busPIRone (BUSPAR) 15 MG tablet Take 15 mg by mouth 2 (two) times daily., Historical Med    carvedilol (COREG) 6.25 MG tablet Take 6.25 mg by mouth 2 (two) times daily with a meal., Historical Med    chlorthalidone (HYGROTON) 25  MG tablet Take 0.5 tablets (12.5 mg total) by mouth every other day., Starting Thu 09/16/2015, Normal    Cholecalciferol (VITAMIN D-3) 1000 UNITS CAPS Take 1,000 Units by mouth daily., Historical Med    citalopram (CELEXA) 40 MG tablet Take 40 mg by mouth daily., Historical Med    ferrous fumarate-iron polysaccharide complex (TANDEM) 162-115.2 MG CAPS capsule Take 1 capsule by mouth daily., Historical Med    HYDROcodone-acetaminophen (NORCO) 10-325 MG tablet Take 1 tablet by mouth every 6 (six) hours as needed for moderate pain., Historical Med    levothyroxine (SYNTHROID, LEVOTHROID) 75 MCG tablet Take 75 mcg by mouth daily., Historical Med    loratadine (CLARITIN) 10 MG tablet Take 10 mg by mouth daily., Historical Med    magnesium oxide (MAG-OX) 400 MG tablet Take 400 mg by mouth daily., Historical Med    pregabalin (LYRICA) 25 MG capsule Take 25 mg by mouth 2 (two) times daily., Historical Med    tolterodine (DETROL LA) 4 MG 24 hr capsule Take 4 mg by mouth daily., Historical Med    zolpidem (AMBIEN) 5 MG tablet Take 0.5 tablets (2.5 mg total) by mouth at bedtime as needed for sleep., Starting Thu 09/16/2015, Print        Today   VITAL SIGNS:  Blood pressure (!) 147/56, pulse 82, temperature 98.7 F (37.1 C), temperature source Oral, resp. rate 20, height 5\' 3"  (1.6 m), weight 74.9 kg (165 lb 2 oz), SpO2 91 %.  I/O:  No intake or output data in the 24 hours ending 09/11/16 1343  PHYSICAL EXAMINATION:  Physical Exam  GENERAL:  75 y.o.-year-old patient lying in the bed with no acute distress.  LUNGS: Normal breath sounds bilaterally, no wheezing, rales,rhonchi or crepitation. No use of accessory muscles of respiration.  CARDIOVASCULAR: S1, S2 normal. No murmurs, rubs, or gallops.  ABDOMEN: Soft, non-tender, non-distended. Bowel sounds present. No organomegaly or mass.  NEUROLOGIC: Moves all 4 extremities. PSYCHIATRIC: The patient is alert and awake  DATA REVIEW:    CBC No results for input(s): WBC, HGB, HCT, PLT in the last 168 hours.  Chemistries  No results for input(s): NA, K, CL, CO2, GLUCOSE, BUN, CREATININE, CALCIUM, MG, AST, ALT, ALKPHOS, BILITOT in the last 168 hours.  Invalid input(s): GFRCGP  Cardiac Enzymes No results for input(s): TROPONINI in the last 168 hours.  Microbiology Results  Results for orders placed or performed during the hospital encounter of 08/28/16  Urine culture     Status: Abnormal   Collection Time: 08/29/16  2:01 PM  Result Value Ref Range Status   Specimen Description URINE, CATHETERIZED  Final   Special Requests NONE  Final   Culture >=100,000 COLONIES/mL ESCHERICHIA COLI (A)  Final   Report Status 09/01/2016 FINAL  Final   Organism ID, Bacteria ESCHERICHIA COLI (A)  Final      Susceptibility   Escherichia coli - MIC*    AMPICILLIN <=2 SENSITIVE Sensitive     CEFAZOLIN <=4 SENSITIVE Sensitive  CEFTRIAXONE <=1 SENSITIVE Sensitive     CIPROFLOXACIN <=0.25 SENSITIVE Sensitive     GENTAMICIN <=1 SENSITIVE Sensitive     IMIPENEM <=0.25 SENSITIVE Sensitive     NITROFURANTOIN <=16 SENSITIVE Sensitive     TRIMETH/SULFA <=20 SENSITIVE Sensitive     AMPICILLIN/SULBACTAM <=2 SENSITIVE Sensitive     PIP/TAZO <=4 SENSITIVE Sensitive     Extended ESBL NEGATIVE Sensitive     * >=100,000 COLONIES/mL ESCHERICHIA COLI  CULTURE, BLOOD (ROUTINE X 2) w Reflex to ID Panel     Status: None   Collection Time: 08/30/16  2:08 PM  Result Value Ref Range Status   Specimen Description BLOOD R HAND  Final   Special Requests BOTTLES DRAWN AEROBIC AND ANAEROBIC 4ML  Final   Culture NO GROWTH 5 DAYS  Final   Report Status 09/04/2016 FINAL  Final  CULTURE, BLOOD (ROUTINE X 2) w Reflex to ID Panel     Status: None   Collection Time: 08/30/16  2:18 PM  Result Value Ref Range Status   Specimen Description BLOOD L HAND  Final   Special Requests   Final    BOTTLES DRAWN AEROBIC AND ANAEROBIC AER 4ML ANA 3ML   Culture NO GROWTH  5 DAYS  Final   Report Status 09/04/2016 FINAL  Final    RADIOLOGY:  No results found.  Follow up with PCP in 1 week.  Management plans discussed with the patient, family and they are in agreement.  CODE STATUS:  Code Status History    Date Active Date Inactive Code Status Order ID Comments User Context   08/28/2016  1:22 PM 09/03/2016  3:21 AM Full Code [id number]  Shaune PollackQing Chen, MD Inpatient   09/14/2015 10:36 PM 09/16/2015  9:58 PM Full Code [id number]  Oralia Manisavid Willis, MD Inpatient      TOTAL TIME TAKING CARE OF THIS PATIENT ON DAY OF DISCHARGE: more than 30 minutes.   Milagros LollSudini, Omar Orrego R M.D on 09/11/2016 at 1:43 PM  Between 7am to 6pm - Pager - [phone]  After 6pm go to [email or web address] - password [email or web address]  [facility] [place] [facility]  Office  [phone]  CC: Primary care physician; Luna FuseEJAN-SIE, SHEIKH AHMED, MD  Note: This dictation was prepared with Dragon dictation along with smaller phrase technology. Any transcriptional errors that result from this process are unintentional.

## 2016-10-25 ENCOUNTER — Inpatient Hospital Stay
Admission: EM | Admit: 2016-10-25 | Discharge: 2016-11-06 | DRG: 682 | Disposition: A | Discharge status: Skilled Nursing Facility | Payer: Medicare Other | Attending: Internal Medicine | Admitting: Internal Medicine

## 2016-10-25 DIAGNOSIS — G8929 Other chronic pain: Secondary | ICD-10-CM | POA: Diagnosis present

## 2016-10-25 DIAGNOSIS — Z8711 Personal history of peptic ulcer disease: Secondary | ICD-10-CM

## 2016-10-25 DIAGNOSIS — N189 Chronic kidney disease, unspecified: Secondary | ICD-10-CM | POA: Diagnosis present

## 2016-10-25 DIAGNOSIS — Z66 Do not resuscitate: Secondary | ICD-10-CM | POA: Diagnosis present

## 2016-10-25 DIAGNOSIS — I351 Nonrheumatic aortic (valve) insufficiency: Secondary | ICD-10-CM | POA: Diagnosis present

## 2016-10-25 DIAGNOSIS — M79605 Pain in left leg: Secondary | ICD-10-CM | POA: Diagnosis present

## 2016-10-25 DIAGNOSIS — D631 Anemia in chronic kidney disease: Secondary | ICD-10-CM | POA: Diagnosis present

## 2016-10-25 DIAGNOSIS — E860 Dehydration: Secondary | ICD-10-CM

## 2016-10-25 DIAGNOSIS — J9601 Acute respiratory failure with hypoxia: Secondary | ICD-10-CM | POA: Diagnosis not present

## 2016-10-25 DIAGNOSIS — M797 Fibromyalgia: Secondary | ICD-10-CM

## 2016-10-25 DIAGNOSIS — J329 Chronic sinusitis, unspecified: Secondary | ICD-10-CM | POA: Diagnosis present

## 2016-10-25 DIAGNOSIS — I10 Essential (primary) hypertension: Secondary | ICD-10-CM | POA: Diagnosis present

## 2016-10-25 DIAGNOSIS — E785 Hyperlipidemia, unspecified: Secondary | ICD-10-CM | POA: Diagnosis present

## 2016-10-25 DIAGNOSIS — N170 Acute kidney failure with tubular necrosis: Secondary | ICD-10-CM | POA: Diagnosis not present

## 2016-10-25 DIAGNOSIS — R571 Hypovolemic shock: Secondary | ICD-10-CM | POA: Diagnosis present

## 2016-10-25 DIAGNOSIS — J9811 Atelectasis: Secondary | ICD-10-CM | POA: Diagnosis present

## 2016-10-25 DIAGNOSIS — R112 Nausea with vomiting, unspecified: Secondary | ICD-10-CM | POA: Diagnosis present

## 2016-10-25 DIAGNOSIS — R52 Pain, unspecified: Secondary | ICD-10-CM

## 2016-10-25 DIAGNOSIS — N390 Urinary tract infection, site not specified: Secondary | ICD-10-CM | POA: Diagnosis not present

## 2016-10-25 DIAGNOSIS — I5022 Chronic systolic (congestive) heart failure: Secondary | ICD-10-CM | POA: Diagnosis present

## 2016-10-25 DIAGNOSIS — E875 Hyperkalemia: Secondary | ICD-10-CM | POA: Diagnosis not present

## 2016-10-25 DIAGNOSIS — Z7189 Other specified counseling: Secondary | ICD-10-CM

## 2016-10-25 DIAGNOSIS — E039 Hypothyroidism, unspecified: Secondary | ICD-10-CM | POA: Diagnosis present

## 2016-10-25 DIAGNOSIS — Z79899 Other long term (current) drug therapy: Secondary | ICD-10-CM

## 2016-10-25 DIAGNOSIS — R0602 Shortness of breath: Secondary | ICD-10-CM

## 2016-10-25 DIAGNOSIS — J45901 Unspecified asthma with (acute) exacerbation: Secondary | ICD-10-CM | POA: Diagnosis not present

## 2016-10-25 DIAGNOSIS — F333 Major depressive disorder, recurrent, severe with psychotic symptoms: Secondary | ICD-10-CM | POA: Diagnosis present

## 2016-10-25 DIAGNOSIS — M810 Age-related osteoporosis without current pathological fracture: Secondary | ICD-10-CM | POA: Diagnosis present

## 2016-10-25 DIAGNOSIS — K219 Gastro-esophageal reflux disease without esophagitis: Secondary | ICD-10-CM | POA: Diagnosis present

## 2016-10-25 DIAGNOSIS — I509 Heart failure, unspecified: Secondary | ICD-10-CM

## 2016-10-25 DIAGNOSIS — R262 Difficulty in walking, not elsewhere classified: Secondary | ICD-10-CM

## 2016-10-25 DIAGNOSIS — Z515 Encounter for palliative care: Secondary | ICD-10-CM

## 2016-10-25 DIAGNOSIS — M6281 Muscle weakness (generalized): Secondary | ICD-10-CM

## 2016-10-25 DIAGNOSIS — I130 Hypertensive heart and chronic kidney disease with heart failure and stage 1 through stage 4 chronic kidney disease, or unspecified chronic kidney disease: Secondary | ICD-10-CM | POA: Diagnosis present

## 2016-10-25 DIAGNOSIS — I700 Atherosclerosis of aorta: Secondary | ICD-10-CM | POA: Diagnosis present

## 2016-10-25 DIAGNOSIS — N184 Chronic kidney disease, stage 4 (severe): Secondary | ICD-10-CM | POA: Diagnosis present

## 2016-10-25 DIAGNOSIS — Z8262 Family history of osteoporosis: Secondary | ICD-10-CM

## 2016-10-25 DIAGNOSIS — R197 Diarrhea, unspecified: Secondary | ICD-10-CM

## 2016-10-25 DIAGNOSIS — Z8261 Family history of arthritis: Secondary | ICD-10-CM

## 2016-10-25 DIAGNOSIS — M79604 Pain in right leg: Secondary | ICD-10-CM

## 2016-10-25 DIAGNOSIS — N179 Acute kidney failure, unspecified: Secondary | ICD-10-CM

## 2016-10-25 DIAGNOSIS — R200 Anesthesia of skin: Secondary | ICD-10-CM

## 2016-10-25 DIAGNOSIS — G2581 Restless legs syndrome: Secondary | ICD-10-CM | POA: Diagnosis present

## 2016-10-25 DIAGNOSIS — K5900 Constipation, unspecified: Secondary | ICD-10-CM | POA: Diagnosis present

## 2016-10-25 DIAGNOSIS — M545 Low back pain: Secondary | ICD-10-CM | POA: Diagnosis present

## 2016-10-25 DIAGNOSIS — Z8249 Family history of ischemic heart disease and other diseases of the circulatory system: Secondary | ICD-10-CM

## 2016-10-25 LAB — CBC WITH DIFFERENTIAL/PLATELET
BASOS PCT: 1 %
Basophils Absolute: 0 10*3/uL (ref 0–0.1)
EOS ABS: 0.2 10*3/uL (ref 0–0.7)
Eosinophils Relative: 3 %
HEMATOCRIT: 25 % — AB (ref 35.0–47.0)
Hemoglobin: 8.1 g/dL — ABNORMAL LOW (ref 12.0–16.0)
Lymphocytes Relative: 14 %
Lymphs Abs: 1.2 10*3/uL (ref 1.0–3.6)
MCH: 29.8 pg (ref 26.0–34.0)
MCHC: 32.6 g/dL (ref 32.0–36.0)
MCV: 91.3 fL (ref 80.0–100.0)
MONO ABS: 0.5 10*3/uL (ref 0.2–0.9)
MONOS PCT: 6 %
NEUTROS ABS: 6.4 10*3/uL (ref 1.4–6.5)
Neutrophils Relative %: 76 %
Platelets: 163 10*3/uL (ref 150–440)
RBC: 2.74 MIL/uL — ABNORMAL LOW (ref 3.80–5.20)
RDW: 16.2 % — AB (ref 11.5–14.5)
WBC: 8.4 10*3/uL (ref 3.6–11.0)

## 2016-10-25 LAB — COMPREHENSIVE METABOLIC PANEL
ALT: 22 U/L (ref 14–54)
AST: 45 U/L — AB (ref 15–41)
Albumin: 2.7 g/dL — ABNORMAL LOW (ref 3.5–5.0)
Alkaline Phosphatase: 111 U/L (ref 38–126)
Anion gap: 7 (ref 5–15)
BILIRUBIN TOTAL: 0.8 mg/dL (ref 0.3–1.2)
BUN: 60 mg/dL — AB (ref 6–20)
CALCIUM: 7.6 mg/dL — AB (ref 8.9–10.3)
CO2: 17 mmol/L — ABNORMAL LOW (ref 22–32)
CREATININE: 2.84 mg/dL — AB (ref 0.44–1.00)
Chloride: 112 mmol/L — ABNORMAL HIGH (ref 101–111)
GFR calc Af Amer: 18 mL/min — ABNORMAL LOW (ref >60)
GFR, EST NON AFRICAN AMERICAN: 15 mL/min — AB (ref >60)
Glucose, Bld: 100 mg/dL — ABNORMAL HIGH (ref 65–99)
Potassium: 5.7 mmol/L — ABNORMAL HIGH (ref 3.5–5.1)
Sodium: 136 mmol/L (ref 135–145)
TOTAL PROTEIN: 5.3 g/dL — AB (ref 6.5–8.1)

## 2016-10-25 LAB — URINALYSIS, ROUTINE W REFLEX MICROSCOPIC
BILIRUBIN URINE: NEGATIVE
GLUCOSE, UA: NEGATIVE mg/dL
HGB URINE DIPSTICK: NEGATIVE
KETONES UR: NEGATIVE mg/dL
Leukocytes, UA: NEGATIVE
Nitrite: NEGATIVE
PH: 5 (ref 5.0–8.0)
Protein, ur: NEGATIVE mg/dL
Specific Gravity, Urine: 1.018 (ref 1.005–1.030)

## 2016-10-25 LAB — LIPASE, BLOOD: LIPASE: 10 U/L — AB (ref 11–51)

## 2016-10-25 LAB — MAGNESIUM: Magnesium: 3.2 mg/dL — ABNORMAL HIGH (ref 1.7–2.4)

## 2016-10-25 LAB — TROPONIN I: Troponin I: <0.03 ng/mL (ref <0.03)

## 2016-10-25 MED ORDER — SODIUM CHLORIDE 0.9 % IV BOLUS (SEPSIS)
1000.0000 mL | INTRAVENOUS | Status: AC
  Administered 2016-10-25: 1000 mL via INTRAVENOUS

## 2016-10-25 NOTE — ED Notes (Signed)
Lab called this nurse to notify CBC clotted in lab. Lab told this nurse to draw another CBC and send down. Collection has been obtained and tubed to lab.

## 2016-10-25 NOTE — ED Notes (Signed)
ED Provider at bedside. 

## 2016-10-25 NOTE — H&P (Signed)
[facility] - [place] at [facility]   PATIENT NAME: Misty Medina    MR#:  [id number]  DATE OF BIRTH:  06/10/1941  DATE OF ADMISSION:  10/25/2016  PRIMARY CARE PHYSICIAN: Luna FuseEJAN-SIE, SHEIKH AHMED, MD   REQUESTING/REFERRING PHYSICIAN: York CeriseForbach, MD  CHIEF COMPLAINT:   Chief Complaint  Patient presents with  . Weakness    N/V/D    HISTORY OF PRESENT ILLNESS:  Misty Medina  is a 75 y.o. female who presents with Nausea vomiting and diarrhea for the past several days. Patient denies any recent antibiotic use, she denies any fevers/chills, abdominal pain, or other infectious symptoms. On lab evaluation here she was found to have acute on chronic renal injury in the setting of profound dehydration. Hospitalists were called for admission.  PAST MEDICAL HISTORY:   Past Medical History:  Diagnosis Date  . Depression   . Fibromyalgia   . GERD (gastroesophageal reflux disease)   . Heart murmur   . Hypertension   . Hypothyroidism   . Osteoporosis     PAST SURGICAL HISTORY:   Past Surgical History:  Procedure Laterality Date  . ABDOMINAL HYSTERECTOMY    . CHOLECYSTECTOMY    . ESOPHAGOGASTRODUODENOSCOPY (EGD) WITH PROPOFOL N/A 08/29/2016   Procedure: ESOPHAGOGASTRODUODENOSCOPY (EGD) WITH PROPOFOL;  Surgeon: Ula Lingoichard Lee, MD;  [facility];  Service: Endoscopy;  Laterality: N/A;    SOCIAL HISTORY:   Social History  Substance Use Topics  . Smoking status: Never Smoker  . Smokeless tobacco: Never Used  . Alcohol use No    FAMILY HISTORY:   Family History  Problem Relation Age of Onset  . Hypertension Mother   . Osteoporosis Mother   . Arthritis Father   . Heart attack Father     DRUG ALLERGIES:   Allergies  Allergen Reactions  . Codeine Nausea And Vomiting  . Penicillins Other (See Comments)    Reaction:  Unknown     MEDICATIONS AT HOME:   Prior to Admission medications   Medication Sig Start Date End Date Taking?  Authorizing Provider  b complex vitamins tablet Take 1 tablet by mouth daily.    Historical Provider, MD  baclofen (LIORESAL) 10 MG tablet Take 10 mg by mouth 3 (three) times daily.    Historical Provider, MD  Biotin 1000 MCG tablet Take 1,000 mcg by mouth daily.    Historical Provider, MD  busPIRone (BUSPAR) 15 MG tablet Take 15 mg by mouth 2 (two) times daily.    Historical Provider, MD  carvedilol (COREG) 6.25 MG tablet Take 6.25 mg by mouth 2 (two) times daily with a meal.    Historical Provider, MD  chlorthalidone (HYGROTON) 25 MG tablet Take 0.5 tablets (12.5 mg total) by mouth every other day. 09/16/15   Katharina Caperima Vaickute, MD  Cholecalciferol (VITAMIN D-3) 1000 UNITS CAPS Take 1,000 Units by mouth daily.    Historical Provider, MD  citalopram (CELEXA) 40 MG tablet Take 40 mg by mouth daily.    Historical Provider, MD  ferrous fumarate-iron polysaccharide complex (TANDEM) 162-115.2 MG CAPS capsule Take 1 capsule by mouth daily.    Historical Provider, MD  HYDROcodone-acetaminophen (NORCO) 10-325 MG tablet Take 1 tablet by mouth every 6 (six) hours as needed for moderate pain.    Historical Provider, MD  levofloxacin (LEVAQUIN) 500 MG tablet Take 1 tablet (500 mg total) by mouth daily. 09/02/16   Srikar Sudini, MD  levothyroxine (SYNTHROID, LEVOTHROID) 75 MCG tablet Take 75 mcg by mouth daily.    Historical  Provider, MD  loratadine (CLARITIN) 10 MG tablet Take 10 mg by mouth daily.    Historical Provider, MD  magnesium oxide (MAG-OX) 400 MG tablet Take 400 mg by mouth daily.    Historical Provider, MD  ondansetron (ZOFRAN) 4 MG tablet Take 1 tablet (4 mg total) by mouth every 6 (six) hours as needed for nausea. 09/02/16   Srikar Sudini, MD  pantoprazole (PROTONIX) 40 MG tablet Take 1 tablet (40 mg total) by mouth 2 (two) times daily before a meal. 09/02/16   Milagros Loll, MD  pregabalin (LYRICA) 25 MG capsule Take 25 mg by mouth 2 (two) times daily.    Historical Provider, MD  tolterodine (DETROL  LA) 4 MG 24 hr capsule Take 4 mg by mouth daily.    Historical Provider, MD  zolpidem (AMBIEN) 5 MG tablet Take 0.5 tablets (2.5 mg total) by mouth at bedtime as needed for sleep. 09/16/15   Katharina Caper, MD    REVIEW OF SYSTEMS:  Review of Systems  Constitutional: Negative for chills, fever, malaise/fatigue and weight loss.  HENT: Negative for ear pain, hearing loss and tinnitus.   Eyes: Negative for blurred vision, double vision, pain and redness.  Respiratory: Negative for cough, hemoptysis and shortness of breath.   Cardiovascular: Negative for chest pain, palpitations, orthopnea and leg swelling.  Gastrointestinal: Positive for diarrhea, nausea and vomiting. Negative for abdominal pain and constipation.  Genitourinary: Negative for dysuria, frequency and hematuria.  Musculoskeletal: Negative for back pain, joint pain and neck pain.  Skin:       No acne, rash, or lesions  Neurological: Negative for dizziness, tremors, focal weakness and weakness.  Endo/Heme/Allergies: Negative for polydipsia. Does not bruise/bleed easily.  Psychiatric/Behavioral: Negative for depression. The patient is not nervous/anxious and does not have insomnia.      VITAL SIGNS:   Vitals:   10/25/16 2100 10/25/16 2130 10/25/16 2200 10/25/16 2230  BP: (!) 108/50 107/61 105/60 101/69  Pulse: 75 74 73 70  Resp: (!) 23     Temp:      TempSrc:      SpO2: 97% 95% 99% 96%  Weight:      Height:       Wt Readings from Last 3 Encounters:  10/25/16 70.3 kg (155 lb)  08/28/16 74.9 kg (165 lb 2 oz)  09/14/15 105.3 kg (232 lb 3.2 oz)    PHYSICAL EXAMINATION:  Physical Exam  Vitals reviewed. Constitutional: She is oriented to person, place, and time. She appears well-developed and well-nourished. No distress.  HENT:  Head: Normocephalic and atraumatic.  Dry mucous membranes  Eyes: Conjunctivae and EOM are normal. Pupils are equal, round, and reactive to light. No scleral icterus.  Neck: Normal range of  motion. Neck supple. No JVD present. No thyromegaly present.  Cardiovascular: Normal rate, regular rhythm and intact distal pulses.  Exam reveals no gallop and no friction rub.   No murmur heard. Respiratory: Effort normal and breath sounds normal. No respiratory distress. She has no wheezes. She has no rales.  GI: Soft. Bowel sounds are normal. She exhibits no distension. There is no tenderness.  Musculoskeletal: Normal range of motion. She exhibits no edema.  No arthritis, no gout  Lymphadenopathy:    She has no cervical adenopathy.  Neurological: She is alert and oriented to person, place, and time. No cranial nerve deficit.  No dysarthria, no aphasia  Skin: Skin is warm and dry. No rash noted. No erythema.  Psychiatric: She has a normal mood  and affect. Her behavior is normal. Judgment and thought content normal.    LABORATORY PANEL:   CBC  Recent Labs Lab 10/25/16 2118  WBC 8.4  HGB 8.1*  HCT 25.0*  PLT 163   ------------------------------------------------------------------------------------------------------------------  Chemistries   Recent Labs Lab 10/25/16 2053  NA 136  K 5.7*  CL 112*  CO2 17*  GLUCOSE 100*  BUN 60*  CREATININE 2.84*  CALCIUM 7.6*  MG 3.2*  AST 45*  ALT 22  ALKPHOS 111  BILITOT 0.8   ------------------------------------------------------------------------------------------------------------------  Cardiac Enzymes  Recent Labs Lab 10/25/16 2053  TROPONINI <0.03   ------------------------------------------------------------------------------------------------------------------  RADIOLOGY:  No results found.  EKG:   Orders placed or performed during the hospital encounter of 08/28/16  . ED EKG  . ED EKG    IMPRESSION AND PLAN:  Principal Problem:   Acute on chronic renal failure (HCC) - almost surely due to prerenal insult from severe dehydration due to her nausea vomiting and diarrhea. We'll hydrate her with IV fluids  tonight and monitor her creatinine for improvement, avoid nephrotoxins. Active Problems:   Chronic systolic CHF (congestive heart failure) (HCC) - hydrate gently with IV fluids, continue home meds   Nausea vomiting and diarrhea - stool studies pending   HTN (hypertension) - continue home meds   Hypothyroidism - home dose thyroid replacement   GERD (gastroesophageal reflux disease) - home dose PPI  All the records are reviewed and case discussed with ED provider. Management plans discussed with the patient and/or family.  DVT PROPHYLAXIS: SubQ heparin  GI PROPHYLAXIS: PPI  ADMISSION STATUS: Observation  CODE STATUS: Full Code Status History    Date Active Date Inactive Code Status Order ID Comments User Context   08/28/2016  1:22 PM 09/03/2016  3:21 AM Full Code [id number]  Shaune PollackQing Chen, MD Inpatient   09/14/2015 10:36 PM 09/16/2015  9:58 PM Full Code [id number]  Oralia Manisavid Sejal Cofield, MD Inpatient    Advance Directive Documentation   Flowsheet Row Most Recent Value  Type of Advance Directive  Healthcare Power of Attorney, Living will  Pre-existing out of facility DNR order (yellow form or pink MOST form)  No data  "MOST" Form in Place?  No data      TOTAL TIME TAKING CARE OF THIS PATIENT: 45 minutes.    Sharmin Foulk FIELDING 10/25/2016, 11:09 PM  [facility]  Office  [phone]  CC: Primary care physician; Luna FuseEJAN-SIE, SHEIKH AHMED, MD

## 2016-10-25 NOTE — ED Provider Notes (Signed)
[facility] Emergency Department Provider Note  ____________________________________________   First MD Initiated Contact with Patient 10/25/16 2203     (approximate)  I have reviewed the triage vital signs and the nursing notes.   HISTORY  Chief Complaint Weakness (N/V/D)    HPI Misty Medina is a 75 y.o. female who presents for evaluation of weakness and diarrhea that has been gradually worsening over 3 days.  She states that she has had multiple episodes of vomiting and several loose stools per day.  She has no known sick contacts.  She has had some difficulty breathing but also has a history of asthma and feels better after breathing treatment given prior to arrival.  She denies chest pain, abdominal pain, and dysuria.  She states her symptoms are severe.  She states that she has not had an appetite recently and has had decreased oral intake as well as having vomiting and diarrhea.  Nothing is making her symptoms better nor worse.   Past Medical History:  Diagnosis Date  . Depression   . Fibromyalgia   . GERD (gastroesophageal reflux disease)   . Heart murmur   . Hypertension   . Hypothyroidism   . Osteoporosis     Patient Active Problem List   Diagnosis Date Noted  . Gastric ulcer 09/02/2016  . E. coli UTI 09/02/2016  . Aspiration pneumonia (HCC) 09/02/2016  . Acute blood loss anemia 09/02/2016  . Hypoxia 09/02/2016  . GIB (gastrointestinal bleeding) 08/28/2016  . Fracture of fifth toe, left, closed 09/16/2015  . Blister of foot without infection 09/16/2015  . Constipation 09/16/2015  . Falls 09/15/2015  . Frequent falls 09/14/2015  . Foot fracture, left 09/14/2015  . Hypothyroidism 09/14/2015  . Chronic systolic CHF (congestive heart failure) (HCC) 09/14/2015  . HTN (hypertension) 09/14/2015  . GERD (gastroesophageal reflux disease) 09/14/2015  . Depression 09/14/2015    Past Surgical History:  Procedure Laterality Date  .  ABDOMINAL HYSTERECTOMY    . CHOLECYSTECTOMY    . ESOPHAGOGASTRODUODENOSCOPY (EGD) WITH PROPOFOL N/A 08/29/2016   Procedure: ESOPHAGOGASTRODUODENOSCOPY (EGD) WITH PROPOFOL;  Surgeon: Ula Lingoichard Lee, MD;  [facility];  Service: Endoscopy;  Laterality: N/A;    Prior to Admission medications   Medication Sig Start Date End Date Taking? Authorizing Provider  b complex vitamins tablet Take 1 tablet by mouth daily.    Historical Provider, MD  baclofen (LIORESAL) 10 MG tablet Take 10 mg by mouth 3 (three) times daily.    Historical Provider, MD  Biotin 1000 MCG tablet Take 1,000 mcg by mouth daily.    Historical Provider, MD  busPIRone (BUSPAR) 15 MG tablet Take 15 mg by mouth 2 (two) times daily.    Historical Provider, MD  carvedilol (COREG) 6.25 MG tablet Take 6.25 mg by mouth 2 (two) times daily with a meal.    Historical Provider, MD  chlorthalidone (HYGROTON) 25 MG tablet Take 0.5 tablets (12.5 mg total) by mouth every other day. 09/16/15   Katharina Caperima Vaickute, MD  Cholecalciferol (VITAMIN D-3) 1000 UNITS CAPS Take 1,000 Units by mouth daily.    Historical Provider, MD  citalopram (CELEXA) 40 MG tablet Take 40 mg by mouth daily.    Historical Provider, MD  ferrous fumarate-iron polysaccharide complex (TANDEM) 162-115.2 MG CAPS capsule Take 1 capsule by mouth daily.    Historical Provider, MD  HYDROcodone-acetaminophen (NORCO) 10-325 MG tablet Take 1 tablet by mouth every 6 (six) hours as needed for moderate pain.    Historical Provider,  MD  levofloxacin (LEVAQUIN) 500 MG tablet Take 1 tablet (500 mg total) by mouth daily. 09/02/16   Srikar Sudini, MD  levothyroxine (SYNTHROID, LEVOTHROID) 75 MCG tablet Take 75 mcg by mouth daily.    Historical Provider, MD  loratadine (CLARITIN) 10 MG tablet Take 10 mg by mouth daily.    Historical Provider, MD  magnesium oxide (MAG-OX) 400 MG tablet Take 400 mg by mouth daily.    Historical Provider, MD  ondansetron (ZOFRAN) 4 MG tablet Take 1 tablet (4 mg  total) by mouth every 6 (six) hours as needed for nausea. 09/02/16   Srikar Sudini, MD  pantoprazole (PROTONIX) 40 MG tablet Take 1 tablet (40 mg total) by mouth 2 (two) times daily before a meal. 09/02/16   Milagros Loll, MD  pregabalin (LYRICA) 25 MG capsule Take 25 mg by mouth 2 (two) times daily.    Historical Provider, MD  tolterodine (DETROL LA) 4 MG 24 hr capsule Take 4 mg by mouth daily.    Historical Provider, MD  zolpidem (AMBIEN) 5 MG tablet Take 0.5 tablets (2.5 mg total) by mouth at bedtime as needed for sleep. 09/16/15   Katharina Caper, MD    Allergies Codeine and Penicillins  Family History  Problem Relation Age of Onset  . Hypertension Mother   . Osteoporosis Mother   . Arthritis Father   . Heart attack Father     Social History Social History  Substance Use Topics  . Smoking status: Never Smoker  . Smokeless tobacco: Never Used  . Alcohol use No    Review of Systems Constitutional: No fever/chills Eyes: No visual changes. ENT: No sore throat. Cardiovascular: Denies chest pain. Respiratory: Mild shortness of breath. Gastrointestinal: No abdominal pain.  +N/V/D Genitourinary: Negative for dysuria. Musculoskeletal: Negative for back pain. Skin: Negative for rash. Neurological: Negative for headaches, focal weakness or numbness.  10-point ROS otherwise negative.  ____________________________________________   PHYSICAL EXAM:  VITAL SIGNS: ED Triage Vitals  Enc Vitals Group     BP 10/25/16 2050 (!) 110/59     Pulse Rate 10/25/16 2050 74     Resp 10/25/16 2050 (!) 22     Temp 10/25/16 2050 97.3 F (36.3 C)     Temp Source 10/25/16 2050 Oral     SpO2 10/25/16 2050 98 %     Weight 10/25/16 2050 155 lb (70.3 kg)     Height 10/25/16 2050 5\' 3"  (1.6 m)     Head Circumference --      Peak Flow --      Pain Score 10/25/16 2051 8     Pain Loc --      Pain Edu? --      Excl. in GC? --     Constitutional: Alert and oriented. Ill appearing but  nontoxic Eyes: Conjunctivae are normal. PERRL. EOMI. Head: Atraumatic. Nose: No congestion/rhinnorhea. Mouth/Throat: Mucous membranes are Dry and cracked.  Oropharynx non-erythematous. Neck: No stridor.  No meningeal signs.   Cardiovascular: Normal rate, regular rhythm. Good peripheral circulation. Grossly normal heart sounds. Respiratory: Normal respiratory effort.  No retractions. Lungs CTAB. Gastrointestinal: Soft and nontender. No distention.  Musculoskeletal: No lower extremity tenderness nor edema. No gross deformities of extremities. Neurologic:  Normal speech and language. No gross focal neurologic deficits are appreciated.  Skin:  Skin is warm, dry and intact. No rash noted. Psychiatric: Mood and affect are normal. Speech and behavior are normal.  ____________________________________________   LABS (all labs ordered are listed, but only abnormal  results are displayed)  Labs Reviewed  MAGNESIUM - Abnormal; Notable for the following:       Result Value   Magnesium 3.2 (*)    All other components within normal limits  COMPREHENSIVE METABOLIC PANEL - Abnormal; Notable for the following:    Potassium 5.7 (*)    Chloride 112 (*)    CO2 17 (*)    Glucose, Bld 100 (*)    BUN 60 (*)    Creatinine, Ser 2.84 (*)    Calcium 7.6 (*)    Total Protein 5.3 (*)    Albumin 2.7 (*)    AST 45 (*)    GFR calc non Af Amer 15 (*)    GFR calc Af Amer 18 (*)    All other components within normal limits  LIPASE, BLOOD - Abnormal; Notable for the following:    Lipase 10 (*)    All other components within normal limits  URINALYSIS, ROUTINE W REFLEX MICROSCOPIC - Abnormal; Notable for the following:    Color, Urine AMBER (*)    APPearance CLEAR (*)    All other components within normal limits  GASTROINTESTINAL PANEL BY PCR, STOOL (REPLACES STOOL CULTURE)  C DIFFICILE QUICK SCREEN W PCR REFLEX  TROPONIN I  CBC WITH DIFFERENTIAL/PLATELET    ____________________________________________  EKG  ED ECG REPORT I, Aziah Brostrom, the attending physician, personally viewed and interpreted this ECG.  Date: 10/25/2016 EKG Time: 20:46 Rate: 73 Rhythm: normal sinus rhythm QRS Axis: normal Intervals: Prolonged QTC at 579 ms ST/T Wave abnormalities: normal Conduction Disturbances: none Narrative Interpretation: unremarkable  ____________________________________________  RADIOLOGY   No results found.  ____________________________________________   PROCEDURES  Procedure(s) performed:   Procedures   Critical Care performed: No ____________________________________________   INITIAL IMPRESSION / ASSESSMENT AND PLAN / ED COURSE  Pertinent labs & imaging results that were available during my care of the patient were reviewed by me and considered in my medical decision making (see chart for details).  Acute renal insufficiency likely secondary to decreased oral intake and increased output.  I have ordered stool studies but the patient has not been having numerous bowel movements, they have just been loose.  No indication for empiric antibiotics.  Ordering fluid bolus and will admit for further management of her acute renal failure.  ____________________________________________  FINAL CLINICAL IMPRESSION(S) / ED DIAGNOSES  Final diagnoses:  Acute renal failure, unspecified acute renal failure type (HCC)  Diarrhea, unspecified type  Dehydration     MEDICATIONS GIVEN DURING THIS VISIT:  Medications  sodium chloride 0.9 % bolus 1,000 mL (not administered)  sodium chloride 0.9 % bolus 1,000 mL (1,000 mLs Intravenous New Bag/Given 10/25/16 2059)     NEW OUTPATIENT MEDICATIONS STARTED DURING THIS VISIT:  New Prescriptions   No medications on file    Modified Medications   No medications on file    Discontinued Medications   No medications on file     Note:  This document was prepared using Dragon  voice recognition software and may include unintentional dictation errors.    Loleta Roseory Sherie Dobrowolski, MD 10/26/16 [phone]

## 2016-10-25 NOTE — ED Triage Notes (Addendum)
Per EMS, pt from home and has N/V/D for the past 3 days. EMS reports pt was lethargic at home and started fluids only able to get an 18 gauge in left EJ. Pt has had of NS so far. Pt also has hx of asthma so breathing treatment was started in transfer, pt's current O2 sat is 97% on RA. Pt A&O at this time.

## 2016-10-26 ENCOUNTER — Inpatient Hospital Stay: Payer: Medicare Other

## 2016-10-26 ENCOUNTER — Observation Stay: Payer: Medicare Other

## 2016-10-26 DIAGNOSIS — M79604 Pain in right leg: Secondary | ICD-10-CM | POA: Diagnosis present

## 2016-10-26 DIAGNOSIS — K219 Gastro-esophageal reflux disease without esophagitis: Secondary | ICD-10-CM | POA: Diagnosis present

## 2016-10-26 DIAGNOSIS — Z66 Do not resuscitate: Secondary | ICD-10-CM | POA: Diagnosis present

## 2016-10-26 DIAGNOSIS — N184 Chronic kidney disease, stage 4 (severe): Secondary | ICD-10-CM

## 2016-10-26 DIAGNOSIS — G2581 Restless legs syndrome: Secondary | ICD-10-CM | POA: Diagnosis present

## 2016-10-26 DIAGNOSIS — N170 Acute kidney failure with tubular necrosis: Secondary | ICD-10-CM | POA: Diagnosis present

## 2016-10-26 DIAGNOSIS — E875 Hyperkalemia: Secondary | ICD-10-CM | POA: Diagnosis not present

## 2016-10-26 DIAGNOSIS — R571 Hypovolemic shock: Secondary | ICD-10-CM | POA: Diagnosis present

## 2016-10-26 DIAGNOSIS — Z515 Encounter for palliative care: Secondary | ICD-10-CM | POA: Diagnosis not present

## 2016-10-26 DIAGNOSIS — I130 Hypertensive heart and chronic kidney disease with heart failure and stage 1 through stage 4 chronic kidney disease, or unspecified chronic kidney disease: Secondary | ICD-10-CM | POA: Diagnosis present

## 2016-10-26 DIAGNOSIS — E039 Hypothyroidism, unspecified: Secondary | ICD-10-CM | POA: Diagnosis present

## 2016-10-26 DIAGNOSIS — E785 Hyperlipidemia, unspecified: Secondary | ICD-10-CM | POA: Diagnosis present

## 2016-10-26 DIAGNOSIS — N179 Acute kidney failure, unspecified: Secondary | ICD-10-CM | POA: Diagnosis present

## 2016-10-26 DIAGNOSIS — I5022 Chronic systolic (congestive) heart failure: Secondary | ICD-10-CM | POA: Diagnosis present

## 2016-10-26 DIAGNOSIS — D631 Anemia in chronic kidney disease: Secondary | ICD-10-CM | POA: Diagnosis present

## 2016-10-26 DIAGNOSIS — M545 Low back pain: Secondary | ICD-10-CM | POA: Diagnosis present

## 2016-10-26 DIAGNOSIS — I351 Nonrheumatic aortic (valve) insufficiency: Secondary | ICD-10-CM | POA: Diagnosis present

## 2016-10-26 DIAGNOSIS — Z7189 Other specified counseling: Secondary | ICD-10-CM | POA: Diagnosis not present

## 2016-10-26 DIAGNOSIS — R112 Nausea with vomiting, unspecified: Secondary | ICD-10-CM | POA: Diagnosis not present

## 2016-10-26 DIAGNOSIS — E860 Dehydration: Secondary | ICD-10-CM | POA: Diagnosis present

## 2016-10-26 DIAGNOSIS — G8929 Other chronic pain: Secondary | ICD-10-CM | POA: Diagnosis present

## 2016-10-26 DIAGNOSIS — J9811 Atelectasis: Secondary | ICD-10-CM | POA: Diagnosis present

## 2016-10-26 DIAGNOSIS — M810 Age-related osteoporosis without current pathological fracture: Secondary | ICD-10-CM | POA: Diagnosis present

## 2016-10-26 DIAGNOSIS — M79605 Pain in left leg: Secondary | ICD-10-CM | POA: Diagnosis present

## 2016-10-26 DIAGNOSIS — N390 Urinary tract infection, site not specified: Secondary | ICD-10-CM | POA: Diagnosis not present

## 2016-10-26 DIAGNOSIS — M797 Fibromyalgia: Secondary | ICD-10-CM | POA: Diagnosis present

## 2016-10-26 DIAGNOSIS — J45901 Unspecified asthma with (acute) exacerbation: Secondary | ICD-10-CM | POA: Diagnosis not present

## 2016-10-26 DIAGNOSIS — F333 Major depressive disorder, recurrent, severe with psychotic symptoms: Secondary | ICD-10-CM | POA: Diagnosis present

## 2016-10-26 DIAGNOSIS — J9601 Acute respiratory failure with hypoxia: Secondary | ICD-10-CM | POA: Diagnosis not present

## 2016-10-26 DIAGNOSIS — I959 Hypotension, unspecified: Secondary | ICD-10-CM

## 2016-10-26 LAB — BASIC METABOLIC PANEL
Anion gap: 7 (ref 5–15)
BUN: 57 mg/dL — ABNORMAL HIGH (ref 6–20)
CALCIUM: 7.6 mg/dL — AB (ref 8.9–10.3)
CO2: 16 mmol/L — AB (ref 22–32)
Chloride: 113 mmol/L — ABNORMAL HIGH (ref 101–111)
Creatinine, Ser: 2.83 mg/dL — ABNORMAL HIGH (ref 0.44–1.00)
GFR calc non Af Amer: 15 mL/min — ABNORMAL LOW (ref >60)
GFR, EST AFRICAN AMERICAN: 18 mL/min — AB (ref >60)
GLUCOSE: 74 mg/dL (ref 65–99)
POTASSIUM: 5.4 mmol/L — AB (ref 3.5–5.1)
Sodium: 136 mmol/L (ref 135–145)

## 2016-10-26 LAB — CBC
HEMATOCRIT: 23 % — AB (ref 35.0–47.0)
HEMOGLOBIN: 7.6 g/dL — AB (ref 12.0–16.0)
MCH: 30.7 pg (ref 26.0–34.0)
MCHC: 33.1 g/dL (ref 32.0–36.0)
MCV: 92.9 fL (ref 80.0–100.0)
Platelets: 179 10*3/uL (ref 150–440)
RBC: 2.48 MIL/uL — AB (ref 3.80–5.20)
RDW: 16.4 % — ABNORMAL HIGH (ref 11.5–14.5)
WBC: 9.2 10*3/uL (ref 3.6–11.0)

## 2016-10-26 LAB — TROPONIN I
Troponin I: 0.03 ng/mL (ref <0.03)
Troponin I: <0.03 ng/mL (ref <0.03)

## 2016-10-26 LAB — MRSA PCR SCREENING: MRSA BY PCR: NEGATIVE

## 2016-10-26 LAB — LACTIC ACID, PLASMA: LACTIC ACID, VENOUS: 0.6 mmol/L (ref 0.5–1.9)

## 2016-10-26 LAB — PREPARE RBC (CROSSMATCH)

## 2016-10-26 LAB — PROCALCITONIN: Procalcitonin: 0.87 ng/mL

## 2016-10-26 MED ORDER — BUDESONIDE 0.5 MG/2ML IN SUSP
0.5000 mg | Freq: Two times a day (BID) | RESPIRATORY_TRACT | Status: DC
  Administered 2016-10-26 – 2016-10-27 (×3): 0.5 mg via RESPIRATORY_TRACT
  Filled 2016-10-26 (×5): qty 2

## 2016-10-26 MED ORDER — PANTOPRAZOLE SODIUM 40 MG PO TBEC
40.0000 mg | DELAYED_RELEASE_TABLET | Freq: Two times a day (BID) | ORAL | Status: DC
  Administered 2016-10-26 – 2016-11-06 (×22): 40 mg via ORAL
  Filled 2016-10-26 (×22): qty 1

## 2016-10-26 MED ORDER — FESOTERODINE FUMARATE ER 4 MG PO TB24
4.0000 mg | ORAL_TABLET | Freq: Every day | ORAL | Status: DC
  Administered 2016-10-26 – 2016-10-27 (×2): 4 mg via ORAL
  Filled 2016-10-26 (×3): qty 1

## 2016-10-26 MED ORDER — BACLOFEN 10 MG PO TABS
10.0000 mg | ORAL_TABLET | Freq: Three times a day (TID) | ORAL | Status: DC
  Administered 2016-10-26 – 2016-11-06 (×34): 10 mg via ORAL
  Filled 2016-10-26 (×34): qty 1

## 2016-10-26 MED ORDER — CARVEDILOL 6.25 MG PO TABS
6.2500 mg | ORAL_TABLET | Freq: Two times a day (BID) | ORAL | Status: DC
Start: 2016-10-26 — End: 2016-10-26

## 2016-10-26 MED ORDER — PREGABALIN 25 MG PO CAPS
25.0000 mg | ORAL_CAPSULE | Freq: Two times a day (BID) | ORAL | Status: DC
  Administered 2016-10-26 – 2016-10-27 (×3): 25 mg via ORAL
  Filled 2016-10-26 (×3): qty 1

## 2016-10-26 MED ORDER — PRAMIPEXOLE DIHYDROCHLORIDE 0.25 MG PO TABS
0.2500 mg | ORAL_TABLET | Freq: Every day | ORAL | Status: DC
  Administered 2016-10-26: 0.25 mg via ORAL
  Filled 2016-10-26: qty 1

## 2016-10-26 MED ORDER — SODIUM CHLORIDE 0.9 % IV SOLN
INTRAVENOUS | Status: AC
  Administered 2016-10-26: 01:00:00 via INTRAVENOUS

## 2016-10-26 MED ORDER — SODIUM CHLORIDE 0.9 % IV SOLN
Freq: Once | INTRAVENOUS | Status: AC
  Administered 2016-10-26: 16:00:00 via INTRAVENOUS

## 2016-10-26 MED ORDER — FUROSEMIDE 10 MG/ML IJ SOLN: 60.0000 mg | Freq: Once | INTRAMUSCULAR | Status: DC

## 2016-10-26 MED ORDER — NOREPINEPHRINE BITARTRATE 1 MG/ML IV SOLN
0.0000 ug/min | INTRAVENOUS | Status: DC
  Administered 2016-10-26: 11 ug/min via INTRAVENOUS
  Administered 2016-10-26: 2 ug/min via INTRAVENOUS
  Filled 2016-10-26 (×3): qty 4

## 2016-10-26 MED ORDER — ONDANSETRON HCL 4 MG PO TABS: 4.0000 mg | ORAL_TABLET | Freq: Four times a day (QID) | ORAL | Status: DC | PRN

## 2016-10-26 MED ORDER — HYDROCORTISONE NA SUCCINATE PF 100 MG IJ SOLR
50.0000 mg | Freq: Three times a day (TID) | INTRAMUSCULAR | Status: DC
  Administered 2016-10-26 – 2016-10-27 (×3): 50 mg via INTRAVENOUS
  Filled 2016-10-26 (×3): qty 2

## 2016-10-26 MED ORDER — HEPARIN SODIUM (PORCINE) 5000 UNIT/ML IJ SOLN
5000.0000 [IU] | Freq: Three times a day (TID) | INTRAMUSCULAR | Status: DC
  Administered 2016-10-26 – 2016-11-06 (×33): 5000 [IU] via SUBCUTANEOUS
  Filled 2016-10-26 (×33): qty 1

## 2016-10-26 MED ORDER — ZOLPIDEM TARTRATE 5 MG PO TABS
5.0000 mg | ORAL_TABLET | Freq: Every evening | ORAL | Status: DC | PRN
  Administered 2016-10-27: 5 mg via ORAL
  Filled 2016-10-26: qty 1

## 2016-10-26 MED ORDER — HYDROCODONE-ACETAMINOPHEN 10-325 MG PO TABS
1.0000 | ORAL_TABLET | Freq: Four times a day (QID) | ORAL | Status: DC | PRN
  Administered 2016-10-26 – 2016-11-02 (×19): 1 via ORAL
  Filled 2016-10-26 (×19): qty 1

## 2016-10-26 MED ORDER — BUSPIRONE HCL 5 MG PO TABS
15.0000 mg | ORAL_TABLET | Freq: Two times a day (BID) | ORAL | Status: DC
  Administered 2016-10-26 – 2016-11-01 (×14): 15 mg via ORAL
  Filled 2016-10-26 (×5): qty 1
  Filled 2016-10-26: qty 3
  Filled 2016-10-26 (×2): qty 1
  Filled 2016-10-26 (×2): qty 3
  Filled 2016-10-26 (×3): qty 1
  Filled 2016-10-26: qty 3
  Filled 2016-10-26: qty 1

## 2016-10-26 MED ORDER — SODIUM CHLORIDE 0.9 % IV SOLN
INTRAVENOUS | Status: DC
  Administered 2016-10-26: 16:00:00 via INTRAVENOUS

## 2016-10-26 MED ORDER — ACETAMINOPHEN 650 MG RE SUPP: 650.0000 mg | Freq: Four times a day (QID) | RECTAL | Status: DC | PRN

## 2016-10-26 MED ORDER — ACETAMINOPHEN 325 MG PO TABS
650.0000 mg | ORAL_TABLET | Freq: Four times a day (QID) | ORAL | Status: DC | PRN
Start: 2016-10-26 — End: 2016-11-06
  Administered 2016-10-30 – 2016-11-06 (×5): 650 mg via ORAL
  Filled 2016-10-26 (×5): qty 2

## 2016-10-26 MED ORDER — IPRATROPIUM-ALBUTEROL 0.5-2.5 (3) MG/3ML IN SOLN
3.0000 mL | Freq: Four times a day (QID) | RESPIRATORY_TRACT | Status: DC
  Administered 2016-10-26 – 2016-10-28 (×6): 3 mL via RESPIRATORY_TRACT
  Filled 2016-10-26 (×8): qty 3

## 2016-10-26 MED ORDER — LEVOTHYROXINE SODIUM 75 MCG PO TABS
75.0000 ug | ORAL_TABLET | Freq: Every day | ORAL | Status: DC
  Administered 2016-10-26 – 2016-11-06 (×12): 75 ug via ORAL
  Filled 2016-10-26 (×12): qty 1

## 2016-10-26 MED ORDER — FESOTERODINE FUMARATE ER 8 MG PO TB24: 8.0000 mg | ORAL_TABLET | Freq: Every day | ORAL | Status: DC

## 2016-10-26 MED ORDER — ZOLPIDEM TARTRATE 5 MG PO TABS
2.5000 mg | ORAL_TABLET | Freq: Every evening | ORAL | Status: DC | PRN
  Administered 2016-10-26: 2.5 mg via ORAL
  Filled 2016-10-26: qty 1

## 2016-10-26 MED ORDER — CITALOPRAM HYDROBROMIDE 20 MG PO TABS
40.0000 mg | ORAL_TABLET | Freq: Every day | ORAL | Status: DC
  Administered 2016-10-26 – 2016-10-31 (×6): 40 mg via ORAL
  Filled 2016-10-26 (×6): qty 2

## 2016-10-26 MED ORDER — ONDANSETRON HCL 4 MG/2ML IJ SOLN
4.0000 mg | Freq: Four times a day (QID) | INTRAMUSCULAR | Status: DC | PRN
  Administered 2016-10-26: 4 mg via INTRAVENOUS
  Filled 2016-10-26: qty 2

## 2016-10-26 NOTE — Progress Notes (Signed)
Blood transfusion complete

## 2016-10-26 NOTE — Consult Note (Signed)
Name: Misty Medina MRN: [id number] DOB: [birth year]    ADMISSION DATE:  10/25/2016 CONSULTATION DATE:  10/25/2016  REFERRING MD :  Dr. Sheryle Hail  CHIEF COMPLAINT:  Hypotension  BRIEF PATIENT DESCRIPTION: This is a 75 yo female who presented to [facility] ER 12/27 with c/o nausea, vomiting, diarrhea onset several days prior to presentation.    SIGNIFICANT EVENTS  12/26-Pt admitted to [facility] with acute on chronic renal failure, nausea, vomiting, and diarrhea 12/28-Pt transferred to ICU due to hypotension secondary to hypovolemic shock and acute respiratory failure secondary to CHF exacerbation  STUDIES:  None  HISTORY OF PRESENT ILLNESS:   This is a 75 yo female with a PMH of Osteoporosis, Hypothyroidism, Asthma, HTN, Heart Murmur, GERD, Fibromyalgia, Depression, and Chronic systolic CHF.  She presented to [facility] ER 12/27 with c/o shortness of breath, poor po intake, nausea, vomiting, and multiple episodes of diarrhea onset several days prior to presentation.   Labs obtained in ER revealed the pt was in acute on chronic renal failure secondary to dehydration pt was subsequently admitted to [facility] unit.  Pt transferred to ICU 12/28 due to persistent hypotension despite fluid resuscitation.  PCCM consulted for management of hypotension requiring vasopressors and acute respiratory failure secondary to pulmonary edema.    PAST MEDICAL HISTORY :   has a past medical history of Depression; Fibromyalgia; GERD (gastroesophageal reflux disease); Heart murmur; Hypertension; Hypothyroidism; and Osteoporosis.  has a past surgical history that includes Cholecystectomy; Abdominal hysterectomy; and Esophagogastroduodenoscopy (egd) with propofol (N/A, 08/29/2016). Prior to Admission medications   Medication Sig Start Date End Date Taking? Authorizing Provider  b complex vitamins tablet Take 1 tablet by mouth daily.   Yes Historical Provider, MD  baclofen (LIORESAL) 10 MG tablet Take 10 mg by mouth 3  (three) times daily.   Yes Historical Provider, MD  Biotin 1000 MCG tablet Take 1,000 mcg by mouth daily.   Yes Historical Provider, MD  busPIRone (BUSPAR) 15 MG tablet Take 15 mg by mouth 2 (two) times daily.   Yes Historical Provider, MD  carvedilol (COREG) 6.25 MG tablet Take 6.25 mg by mouth 2 (two) times daily with a meal.   Yes Historical Provider, MD  chlorthalidone (HYGROTON) 25 MG tablet Take 0.5 tablets (12.5 mg total) by mouth every other day. 09/16/15  Yes Katharina Caper, MD  Cholecalciferol (VITAMIN D-3) 1000 UNITS CAPS Take 1,000 Units by mouth daily.   Yes Historical Provider, MD  citalopram (CELEXA) 40 MG tablet Take 40 mg by mouth daily.   Yes Historical Provider, MD  DUREZOL 0.05 % EMUL Place 1 drop into both eyes daily. 09/28/16  Yes Historical Provider, MD  ferrous fumarate-iron polysaccharide complex (TANDEM) 162-115.2 MG CAPS capsule Take 1 capsule by mouth daily.   Yes Historical Provider, MD  HYDROcodone-acetaminophen (NORCO) 10-325 MG tablet Take 1 tablet by mouth every 6 (six) hours as needed for moderate pain.   Yes Historical Provider, MD  ILEVRO 0.3 % ophthalmic suspension Place 1 drop into both eyes daily. 09/28/16  Yes Historical Provider, MD  levothyroxine (SYNTHROID, LEVOTHROID) 75 MCG tablet Take 75 mcg by mouth daily.   Yes Historical Provider, MD  lisinopril (PRINIVIL,ZESTRIL) 5 MG tablet Take 5 mg by mouth every morning. 10/04/16  Yes Historical Provider, MD  loratadine (CLARITIN) 10 MG tablet Take 10 mg by mouth daily.   Yes Historical Provider, MD  magnesium oxide (MAG-OX) 400 MG tablet Take 400 mg by mouth daily.   Yes Historical Provider, MD  ondansetron (  ZOFRAN) 4 MG tablet Take 1 tablet (4 mg total) by mouth every 6 (six) hours as needed for nausea. 09/02/16  Yes Srikar Sudini, MD  pantoprazole (PROTONIX) 40 MG tablet Take 1 tablet (40 mg total) by mouth 2 (two) times daily before a meal. 09/02/16  Yes Srikar Sudini, MD  pregabalin (LYRICA) 25 MG capsule Take  25 mg by mouth 2 (two) times daily.   Yes Historical Provider, MD  rOPINIRole (REQUIP) 0.5 MG tablet Take 1 tablet by mouth every evening. 10/04/16  Yes Historical Provider, MD  tolterodine (DETROL LA) 4 MG 24 hr capsule Take 4 mg by mouth daily.   Yes Historical Provider, MD  zolpidem (AMBIEN) 5 MG tablet Take 0.5 tablets (2.5 mg total) by mouth at bedtime as needed for sleep. 09/16/15  Yes Katharina Caperima [place]ickute, MD   Allergies  Allergen Reactions  . Codeine Nausea And Vomiting  . Penicillins Other (See Comments)    Reaction:  Unknown     FAMILY HISTORY:  family history includes Arthritis in her father; Heart attack in her father; Hypertension in her mother; Osteoporosis in her mother. SOCIAL HISTORY:  reports that she has never smoked. She has never used smokeless tobacco. She reports that she does not drink alcohol or use drugs.  REVIEW OF SYSTEMS:  Positives in BOLD Constitutional: Negative for fever, chills, weight loss, malaise/fatigue and diaphoresis.  HENT: Negative for hearing loss, ear pain, nosebleeds, congestion, sore throat, neck pain, tinnitus and ear discharge.   Eyes: Negative for blurred vision, double vision, photophobia, pain, discharge and redness.  Respiratory: Negative for cough, hemoptysis, sputum production, shortness of breath, wheezing and stridor.   Cardiovascular: Negative for chest pain, palpitations, orthopnea, claudication, leg swelling and PND.  Gastrointestinal: Negative for heartburn, nausea, vomiting, abdominal pain, diarrhea, constipation, blood in stool and melena.  Genitourinary: Negative for dysuria, urgency, frequency, hematuria and flank pain.  Musculoskeletal: Negative for myalgias, back pain, joint pain and falls.  Skin: Negative for itching and rash.  Neurological: Negative for dizziness, tingling, tremors, sensory change, speech change, focal weakness, seizures, loss of consciousness, weakness and headaches.  Endo/Heme/Allergies: Negative for  environmental allergies and polydipsia. Does not bruise/bleed easily.  SUBJECTIVE:   VITAL SIGNS: Temp:  [97.3 F (36.3 C)-98.5 F (36.9 C)] 98.3 F (36.8 C) (12/28 0520) Pulse Rate:  [70-88] 79 (12/28 0601) Resp:  [19-25] 19 (12/28 0520) BP: (77-134)/(41-69) 83/45 (12/28 0601) SpO2:  [95 %-100 %] 100 % (12/28 0520) Weight:  [155 lb (70.3 kg)-164 lb 1.6 oz (74.4 kg)] 164 lb 1.6 oz (74.4 kg) (12/28 0047)  PHYSICAL EXAMINATION: General:  Awake, alert, no acute distress Neuro:  Non-focal A and O times 3.  HEENT:  PERRLA, EOMI Cardiovascular:  RRR, no MRG Lungs:  CTA B;  Abdomen:  Soft, NT, ND Musculoskeletal:  Normal Skin:  WNL   Recent Labs Lab 10/25/16 2053 10/26/16 0437  NA 136 136  K 5.7* 5.4*  CL 112* 113*  CO2 17* 16*  BUN 60* 57*  CREATININE 2.84* 2.83*  GLUCOSE 100* 74    Recent Labs Lab 10/25/16 2118 10/26/16 0437  HGB 8.1* 7.6*  HCT 25.0* 23.0*  WBC 8.4 9.2  PLT 163 179   No results found.  ASSESSMENT / PLAN: Hypotension secondary to hypovolemic shock Acute on chronic renal failure secondary to hypovolemic shock  Nausea, Vomiting, and Diarrhea Acute respiratory failure secondary to CHF exacerbation- improved.  Echo 09/15/15; EF=60% LE dopplers reviewed: negative.  CXR image 12/28 reviewed; mild LLL atelectasis,  otherwise unremarkable.   Hx: Asthma  P: Supplemental O2 to maintain O2 sats >92% Stat CXR 12/28 Levophed gtt to maintain map >65 Empiric steroids Discontinue Carvedilol  Cdiff and GI panel results pending  MRSA PCR negative. U/A unremarkable. Check bld culture, lactic acid, PCT doubt septic shock, will hold abx for now.  Sonda Rumbleana Blakeney, AGNP  Pulmonary/Critical Care Pager [phone] (please enter 7 digits) PCCM Consult Pager [phone] (please enter 7 digits)  Pt seen and examined with NP and agree with findings, assessment and plan as amended above. Hypotensive suspect secondary to hypovolemic shock and ARF. Continue  hydration, wean pressors. Doubt septic shock, but will check lactic acid, PCT, bld cx.   Wells Guiles-Deep Malaiah Viramontes, M.D. 10/26/2016   Critical Care Attestation.  I have personally obtained a history, examined the patient, evaluated laboratory and imaging results, formulated the assessment and plan and placed orders. The Patient requires high complexity decision making for assessment and support, frequent evaluation and titration of therapies, application of advanced monitoring technologies and extensive interpretation of multiple databases. The patient has critical illness that could lead imminently to failure of 1 or more organ systems and requires the highest level of physician preparedness to intervene.  Critical Care Time devoted to patient care services described in this note is 45 minutes and is exclusive of time spent in procedures.

## 2016-10-26 NOTE — Progress Notes (Signed)
Patient BP low. Primary fluids infusing. MD paged. Dr. Sheryle Hailiamond notified new orders given.

## 2016-10-26 NOTE — Progress Notes (Signed)
Patient ID: Misty Medina, female   DOB: 12/21/1940, 75 y.o.   MRN: [id number]  [employer] PROGRESS NOTE  Misty PartridgeBrenda G Medina WUJ:[id number] DOB: 12/27/1940 DOA: 10/25/2016 PCP: Luna FuseEJAN-SIE, SHEIKH AHMED, MD  HPI/Subjective: Patient does not feel well. Had diarrhea prior to coming into the hospital but none since being in the hospital. Poor appetite. No nausea or vomiting. Some abdominal discomfort. Feels weak.  Objective: Vitals:   10/26/16 1500 10/26/16 1530  BP: (!) 93/59 (!) 95/46  Pulse: 85 81  Resp: 19 18  Temp:      Filed Weights   10/25/16 2050 10/26/16 0047 10/26/16 0715  Weight: 70.3 kg (155 lb) 74.4 kg (164 lb 1.6 oz) 77.2 kg (170 lb 3.1 oz)    ROS: Review of Systems  Constitutional: Negative for chills and fever.  Eyes: Negative for blurred vision.  Respiratory: Positive for shortness of breath. Negative for cough.   Cardiovascular: Negative for chest pain.  Gastrointestinal: Positive for abdominal pain and diarrhea. Negative for constipation, nausea and vomiting.  Genitourinary: Negative for dysuria.  Musculoskeletal: Negative for joint pain.  Neurological: Positive for weakness. Negative for dizziness and headaches.   Exam: Physical Exam  HENT:  Nose: No mucosal edema.  Mouth/Throat: No oropharyngeal exudate or posterior oropharyngeal edema.  Eyes: Conjunctivae, EOM and lids are normal. Pupils are equal, round, and reactive to light.  Neck: No JVD present. Carotid bruit is not present. No edema present. No thyroid mass and no thyromegaly present.  Cardiovascular: S1 normal and S2 normal.  Exam reveals no gallop.   No murmur heard. Pulses:      Dorsalis pedis pulses are 2+ on the right side, and 2+ on the left side.  Respiratory: No respiratory distress. She has decreased breath sounds in the right middle field, the right lower field, the left middle field and the left lower field. She has no wheezes. She has rhonchi in the right lower field and the left lower  field. She has no rales.  GI: Soft. Bowel sounds are normal. There is tenderness.  Musculoskeletal:       Right ankle: She exhibits swelling.       Left ankle: She exhibits swelling.  Lymphadenopathy:    She has no cervical adenopathy.  Neurological: She is alert. No cranial nerve deficit.  Skin: Skin is warm. No rash noted. Nails show no clubbing.  Psychiatric: She has a normal mood and affect.      Data Reviewed: Basic Metabolic Panel:  Recent Labs Lab 10/25/16 2053 10/26/16 0437  NA 136 136  K 5.7* 5.4*  CL 112* 113*  CO2 17* 16*  GLUCOSE 100* 74  BUN 60* 57*  CREATININE 2.84* 2.83*  CALCIUM 7.6* 7.6*  MG 3.2*  --    Liver Function Tests:  Recent Labs Lab 10/25/16 2053  AST 45*  ALT 22  ALKPHOS 111  BILITOT 0.8  PROT 5.3*  ALBUMIN 2.7*    Recent Labs Lab 10/25/16 2053  LIPASE 10*   No results for input(s): AMMONIA in the last 168 hours. CBC:  Recent Labs Lab 10/25/16 2118 10/26/16 0437  WBC 8.4 9.2  NEUTROABS 6.4  --   HGB 8.1* 7.6*  HCT 25.0* 23.0*  MCV 91.3 92.9  PLT 163 179   Cardiac Enzymes:  Recent Labs Lab 10/25/16 2053  TROPONINI <0.03     Recent Results (from the past 240 hour(s))  MRSA PCR Screening     Status: None   Collection Time: 10/26/16  7:17 AM  Result Value Ref Range Status   MRSA by PCR NEGATIVE NEGATIVE Final    Comment:        The GeneXpert MRSA Assay (FDA approved for NASAL specimens only), is one component of a comprehensive MRSA colonization surveillance program. It is not intended to diagnose MRSA infection nor to guide or monitor treatment for MRSA infections.      Studies: [place] Venous Img Lower Bilateral  Result Date: 10/26/2016 CLINICAL DATA:  Bilateral leg pain. Shortness of breath. Evaluate for DVT. EXAM: BILATERAL LOWER EXTREMITY VENOUS DOPPLER ULTRASOUND TECHNIQUE: Gray-scale sonography with graded compression, as well as color Doppler and duplex ultrasound were performed to evaluate the  lower extremity deep venous systems from the level of the common femoral vein and including the common femoral, femoral, profunda femoral, popliteal and calf veins including the posterior tibial, peroneal and gastrocnemius veins when visible. The superficial great saphenous vein was also interrogated. Spectral Doppler was utilized to evaluate flow at rest and with distal augmentation maneuvers in the common femoral, femoral and popliteal veins. COMPARISON:  None. FINDINGS: RIGHT LOWER EXTREMITY Common Femoral Vein: No evidence of thrombus. Normal compressibility, respiratory phasicity and response to augmentation. Saphenofemoral Junction: No evidence of thrombus. Normal compressibility and flow on color Doppler imaging. Profunda Femoral Vein: No evidence of thrombus. Normal compressibility and flow on color Doppler imaging. Femoral Vein: No evidence of thrombus. Normal compressibility, respiratory phasicity and response to augmentation. Popliteal Vein: No evidence of thrombus. Normal compressibility, respiratory phasicity and response to augmentation. Calf Veins: No evidence of thrombus. Normal compressibility and flow on color Doppler imaging. Superficial Great Saphenous Vein: No evidence of thrombus. Normal compressibility and flow on color Doppler imaging. Venous Reflux:  None. Other Findings:  None. LEFT LOWER EXTREMITY Common Femoral Vein: No evidence of thrombus. Normal compressibility, respiratory phasicity and response to augmentation. Saphenofemoral Junction: No evidence of thrombus. Normal compressibility and flow on color Doppler imaging. Profunda Femoral Vein: No evidence of thrombus. Normal compressibility and flow on color Doppler imaging. Femoral Vein: No evidence of thrombus. Normal compressibility, respiratory phasicity and response to augmentation. Popliteal Vein: No evidence of thrombus. Normal compressibility, respiratory phasicity and response to augmentation. Calf Veins: No evidence of  thrombus. Normal compressibility and flow on color Doppler imaging. Superficial Great Saphenous Vein: No evidence of thrombus. Normal compressibility and flow on color Doppler imaging. Venous Reflux:  None. Other Findings:  None. IMPRESSION: No evidence of DVT within either lower extremity. Electronically Signed   By: Simonne ComeJohn  Watts M.D.   On: 10/26/2016 12:12   Dg Chest Port 1 View  Result Date: 10/26/2016 CLINICAL DATA:  Congestive heart failure. EXAM: PORTABLE CHEST 1 VIEW COMPARISON:  11/10/2015 FINDINGS: The patchy infiltrates present in the left lung on the prior study have resolved with the exception of a small area of atelectasis at the left lung base medially. Right lung is clear. Slight cardiomegaly. Calcification of the thoracic aorta. IMPRESSION: Minimal atelectasis at the left lung base.  Chronic cardiomegaly. Aortic atherosclerosis. Electronically Signed   By: Francene BoyersJames  Maxwell M.D.   On: 10/26/2016 07:09    Scheduled Meds: . sodium chloride   Intravenous Once  . baclofen  10 mg Oral TID  . busPIRone  15 mg Oral BID  . citalopram  40 mg Oral Daily  . fesoterodine  4 mg Oral Daily  . furosemide  60 mg Intravenous Once  . heparin  5,000 Units Subcutaneous Q8H  . hydrocortisone sod succinate (SOLU-CORTEF) inj  50 mg  Intravenous Q8H  . levothyroxine  75 mcg Oral QAC breakfast  . pantoprazole  40 mg Oral BID AC  . pramipexole  0.25 mg Oral QHS  . pregabalin  25 mg Oral BID   Continuous Infusions: . sodium chloride    . norepinephrine (LEVOPHED) Adult infusion 8 mcg/min (10/26/16 1430)    Assessment/Plan:  1. Hypovolemic shock. Patient on levophed. Patient received IV fluid boluses. Hemoglobin has come down to 7.6. I will go ahead and transfuse a unit of blood to try to get the blood pressure better and hemoglobin up. Patient understands the benefits and risks and has consented to blood transfusion. Continue to hold antihypertensive medications. Patient placed on stress dose steroids  also 2. Acute kidney injury on chronic kidney disease stage III. This is likely secondary to hypotension. 3. Acute hypoxic respiratory failure with Asthma exacerbation on IV steroids. Start nebulizer treatments. Continue oxygen. 4. Diarrhea. Stool studies if able to have a bowel movement 5. Hypothyroidism unspecified on levothyroxine 6. GERD and recent gastric ulcer on endoscopy on Protonix 7. Restless leg syndrome, start Mirapex 8. Fibromyalgia on Lyrica and baclofen 9. Depression continue psychiatric medication  Code Status:     Code Status Orders        Start     Ordered   10/26/16 0026  Do not attempt resuscitation (DNR)  Continuous    Question Answer Comment  In the event of cardiac or respiratory ARREST Do not call a "code blue"   In the event of cardiac or respiratory ARREST Do not perform Intubation, CPR, defibrillation or ACLS   In the event of cardiac or respiratory ARREST Use medication by any route, position, wound care, and other measures to relive pain and suffering. May use oxygen, suction and manual treatment of airway obstruction as needed for comfort.      10/26/16 0025    Code Status History    Date Active Date Inactive Code Status Order ID Comments User Context   08/28/2016  1:22 PM 09/03/2016  3:21 AM Full Code [id number]  Shaune Pollack, MD Inpatient   09/14/2015 10:36 PM 09/16/2015  9:58 PM Full Code [id number]  Oralia Manis, MD Inpatient    Advance Directive Documentation   Flowsheet Row Most Recent Value  Type of Advance Directive  Healthcare Power of Attorney, Living will  Pre-existing out of facility DNR order (yellow form or pink MOST form)  No data  "MOST" Form in Place?  No data     Disposition Plan: To be determined  Consultants:  Critical care specialist  Time spent: 35 minutes, patient critically ill on levophed drip to maintain blood pressure.  Alford Highland  [employer]

## 2016-10-26 NOTE — Progress Notes (Signed)
Chaplain rounded the unit to provide a compassionate presence and spiritual support to the patient. Patient appeared to be anxious to go and said so. Prayer calmed her. Jefm PettyChaplain Calin Fantroy [phone]

## 2016-10-26 NOTE — ED Notes (Signed)
Awaiting transport.

## 2016-10-27 ENCOUNTER — Inpatient Hospital Stay: Payer: Medicare Other

## 2016-10-27 DIAGNOSIS — N170 Acute kidney failure with tubular necrosis: Principal | ICD-10-CM

## 2016-10-27 DIAGNOSIS — N181 Chronic kidney disease, stage 1: Secondary | ICD-10-CM

## 2016-10-27 DIAGNOSIS — R197 Diarrhea, unspecified: Secondary | ICD-10-CM

## 2016-10-27 DIAGNOSIS — R112 Nausea with vomiting, unspecified: Secondary | ICD-10-CM

## 2016-10-27 LAB — TROPONIN I: Troponin I: <0.03 ng/mL (ref <0.03)

## 2016-10-27 LAB — COMPREHENSIVE METABOLIC PANEL
ALBUMIN: 3.2 g/dL — AB (ref 3.5–5.0)
ALK PHOS: 125 U/L (ref 38–126)
ALT: 19 U/L (ref 14–54)
AST: 23 U/L (ref 15–41)
Anion gap: 7 (ref 5–15)
BILIRUBIN TOTAL: 0.7 mg/dL (ref 0.3–1.2)
BUN: 53 mg/dL — AB (ref 6–20)
CO2: 17 mmol/L — ABNORMAL LOW (ref 22–32)
Calcium: 8.3 mg/dL — ABNORMAL LOW (ref 8.9–10.3)
Chloride: 110 mmol/L (ref 101–111)
Creatinine, Ser: 2.47 mg/dL — ABNORMAL HIGH (ref 0.44–1.00)
GFR calc Af Amer: 21 mL/min — ABNORMAL LOW (ref >60)
GFR calc non Af Amer: 18 mL/min — ABNORMAL LOW (ref >60)
GLUCOSE: 187 mg/dL — AB (ref 65–99)
POTASSIUM: 5.9 mmol/L — AB (ref 3.5–5.1)
Sodium: 134 mmol/L — ABNORMAL LOW (ref 135–145)
TOTAL PROTEIN: 6.4 g/dL — AB (ref 6.5–8.1)

## 2016-10-27 LAB — POTASSIUM: Potassium: 5 mmol/L (ref 3.5–5.1)

## 2016-10-27 LAB — CBC
HEMATOCRIT: 31 % — AB (ref 35.0–47.0)
HEMOGLOBIN: 10.7 g/dL — AB (ref 12.0–16.0)
MCH: 31.1 pg (ref 26.0–34.0)
MCHC: 34.5 g/dL (ref 32.0–36.0)
MCV: 90.4 fL (ref 80.0–100.0)
Platelets: 255 10*3/uL (ref 150–440)
RBC: 3.43 MIL/uL — AB (ref 3.80–5.20)
RDW: 15.2 % — AB (ref 11.5–14.5)
WBC: 13.8 10*3/uL — AB (ref 3.6–11.0)

## 2016-10-27 LAB — TYPE AND SCREEN
ABO/RH(D): AB POS
ANTIBODY SCREEN: NEGATIVE
Unit division: 0

## 2016-10-27 LAB — PROCALCITONIN: PROCALCITONIN: 0.61 ng/mL

## 2016-10-27 MED ORDER — SODIUM CHLORIDE 0.9 % IV SOLN
1.0000 g | Freq: Once | INTRAVENOUS | Status: AC
  Administered 2016-10-27: 1 g via INTRAVENOUS
  Filled 2016-10-27: qty 10

## 2016-10-27 MED ORDER — DEXTROSE 50 % IV SOLN
25.0000 g | Freq: Once | INTRAVENOUS | Status: AC
  Administered 2016-10-27: 25 g via INTRAVENOUS
  Filled 2016-10-27: qty 50

## 2016-10-27 MED ORDER — PREGABALIN 25 MG PO CAPS
25.0000 mg | ORAL_CAPSULE | Freq: Every day | ORAL | Status: DC
  Administered 2016-10-28 – 2016-11-06 (×10): 25 mg via ORAL
  Filled 2016-10-27 (×10): qty 1

## 2016-10-27 MED ORDER — PATIROMER SORBITEX CALCIUM 8.4 G PO PACK
8.4000 g | PACK | Freq: Every day | ORAL | Status: DC
  Filled 2016-10-27 (×3): qty 4

## 2016-10-27 MED ORDER — SODIUM CHLORIDE 0.9 % IV SOLN
INTRAVENOUS | Status: DC
  Administered 2016-10-27 – 2016-10-29 (×2): via INTRAVENOUS

## 2016-10-27 MED ORDER — SODIUM BICARBONATE 8.4 % IV SOLN
50.0000 meq | Freq: Once | INTRAVENOUS | Status: AC
  Administered 2016-10-27: 50 meq via INTRAVENOUS
  Filled 2016-10-27: qty 50

## 2016-10-27 MED ORDER — SODIUM POLYSTYRENE SULFONATE 15 GM/60ML PO SUSP
30.0000 g | Freq: Once | ORAL | Status: AC
  Administered 2016-10-27: 30 g via ORAL
  Filled 2016-10-27: qty 120

## 2016-10-27 MED ORDER — INSULIN ASPART 100 UNIT/ML ~~LOC~~ SOLN
10.0000 [IU] | Freq: Once | SUBCUTANEOUS | Status: AC
  Administered 2016-10-27: 10 [IU] via INTRAVENOUS
  Filled 2016-10-27: qty 10

## 2016-10-27 NOTE — Evaluation (Signed)
Physical Therapy Evaluation Patient Details Name: Misty PartridgeBrenda G Spivak MRN: [id number] DOB: 06/06/1941 Today's Date: 10/27/2016   History of Present Illness  Pt admitted for acute on chronic renal failure. Pt complains of weakness from N/V. History includes depression, fibromyalgia, and GERD. Pt with pending psych consult.   Clinical Impression  Pt is a pleasant 75 year old female who was admitted for acute on chronic renal failure. Pt very lethargic upon arrival, takes sternal rubbing for proper arousal. Hesitant to work with PT at this time. Pt performs rolling in bed with cga, refusing further OOB mobility despite heavy encouragement from therapist. Pt demonstrates sufficient strength for future attempts. Pt demonstrates deficits with strength/mobility/endurance. Pt thinks she is imminently dying, requesting to go to hospice home as she does not want to return home with grandson or go to SNF. Discussed speaking with chaplain.  Pt is currently not at baseline level. Would benefit from skilled PT to address above deficits and promote optimal return to PLOF; recommend transition to STR upon discharge from acute hospitalization.       Follow Up Recommendations SNF (with transition to LTC if unable to care for self)    Equipment Recommendations       Recommendations for Other Services       Precautions / Restrictions Precautions Precautions: Fall Restrictions Weight Bearing Restrictions: No      Mobility  Bed Mobility Overal bed mobility: Needs Assistance Bed Mobility: Rolling Rolling: Min guard         General bed mobility comments: assist for initiation, however pt able to roll to B sides, however complains of stomach pain with any movement.   Transfers                 General transfer comment: not performed at this time secondary to heavy refusal.  Ambulation/Gait                Stairs            Wheelchair Mobility    Modified Rankin (Stroke Patients  Only)       Balance Overall balance assessment: History of Falls;Needs assistance (reports 1 fall in last 6 months)                                           Pertinent Vitals/Pain Pain Assessment: Faces Faces Pain Scale: Hurts little more Pain Location: grimacing towards stomach Pain Descriptors / Indicators: Discomfort Pain Intervention(s): Limited activity within patient's tolerance    Home Living Family/patient expects to be discharged to:: Private residence Living Arrangements:  (lives with grandson) Available Help at Discharge: Family;Available PRN/intermittently Type of Home: House Home Access: Stairs to enter Entrance Stairs-Rails: Right Entrance Stairs-Number of Steps: 5 Home Layout: One level Home Equipment: Walker - 2 wheels;Bedside commode;Shower seat - built in;Cane - single point      Prior Function Level of Independence: Independent         Comments: Indep with ADLs and household mobility without assist device at baseline; intermittent use of RW as needed when 'feeling weak'     Hand Dominance        Extremity/Trunk Assessment   Upper Extremity Assessment Upper Extremity Assessment: Overall WFL for tasks assessed    Lower Extremity Assessment Lower Extremity Assessment: Generalized weakness (B LE grossly 4/5)       Communication   Communication: No difficulties  Cognition Arousal/Alertness: Lethargic Behavior During Therapy: Anxious Overall Cognitive Status: Difficult to assess                 General Comments: needed sternal rub to awaken    General Comments      Exercises Other Exercises Other Exercises: attempted ther-ex, however pt unable to participate more than a few reps prior to fatigue. Attempted SLRs and ankle pumps. Pt clenches up fists while performing ther-ex and holds breath. Cues given to breathe, however O2 sats decrease to 85% while on 2L of O2. Improves with resting.   Assessment/Plan    PT  Assessment Patient needs continued PT services  PT Problem List Decreased strength;Decreased activity tolerance;Decreased balance;Decreased mobility;Pain;Decreased safety awareness          PT Treatment Interventions Gait training;DME instruction;Therapeutic exercise    PT Goals (Current goals can be found in the Care Plan section)  Acute Rehab PT Goals Patient Stated Goal: to go hospice home- she thinks she is dying. No other rehab goals stated PT Goal Formulation: With patient Time For Goal Achievement: 11/10/16 Potential to Achieve Goals: Poor    Frequency Min 2X/week   Barriers to discharge        Co-evaluation               End of Session Equipment Utilized During Treatment: Oxygen Activity Tolerance: Treatment limited secondary to medical complications (Comment) Patient left: in bed;with bed alarm set (requesting to see chaplain-told secretary) Nurse Communication: Mobility status         Time: [id number] PT Time Calculation (min) (ACUTE ONLY): 12 min   Charges:   PT Evaluation $PT Eval Low Complexity: 1 Procedure     PT G Codes:        Shetara Launer 10/27/2016, 3:55 PM Elizabeth PalauStephanie Mehr Depaoli, PT, DPT [phone]

## 2016-10-27 NOTE — Progress Notes (Signed)
Patient states that she is dying and requested to see the chaplain.  I had the Diplomatic Services operational officersecretary page the chaplain.  Arturo MortonClay, Ellie Spickler N  10/27/2016  9:05 PM

## 2016-10-27 NOTE — Consult Note (Signed)
Name: Misty Medina MRN: [id number] DOB: 03/23/1941    ADMISSION DATE:  10/25/2016 CONSULTATION DATE:  10/25/2016  REFERRING MD :  Dr. Sheryle Hailiamond  CHIEF COMPLAINT:  Hypotension  BRIEF PATIENT DESCRIPTION: This is a 75 yo female who presented to [facility] 12/27 with c/o nausea, vomiting, diarrhea onset several days prior to presentation.    SIGNIFICANT EVENTS  12/26-Pt admitted [facility] med[facility]urg with acute on chronic renal failure, nausea, vomiting, and diarrhea 12/28-Pt transferred to ICU due to hypotension secondary to hypovolemic shock and acute respiratory failure secondary to CHF exacerbation  STUDIES:  None  HISTORY OF PRESENT ILLNESS:   This is a 75 yo female with a PMH of Osteoporosis, Hypothyroidism, Asthma, HTN, Heart Murmur, GERD, Fibromyalgia, Depression, and Chronic systolic CHF.  She presented to [facility] ER 12/27 with c/o shortness of breath, poor po intake, nausea, vomiting, and multiple episodes of diarrhea onset several days prior to presentation.   Labs obtained in ER revealed the pt was in acute on chronic renal failure secondary to dehydration [facility]  Pt transferred to ICU 12/28 due to persistent hypotension despite fluid resuscitation.  PCCM consulted for management of hypotension requiring vasopressors and acute respiratory failure secondary to pulmonary edema.     REVIEW OF SYSTEMS:  Positives in BOLD Constitutional: Negative for fever, chills, weight loss, malaise/fatigue and diaphoresis.  HENT: Negative for hearing loss, ear pain, nosebleeds, congestion, sore throat, neck pain, tinnitus and ear discharge.   Eyes: Negative for blurred vision, double vision, photophobia, pain, discharge and redness.  Respiratory: Negative for cough, hemoptysis, sputum production, shortness of breath, wheezing and stridor.   Cardiovascular: Negative for chest pain, palpitations, orthopnea, claudication, leg swelling and PND.  Gastrointestinal:  Negative for heartburn, nausea, vomiting, abdominal pain, diarrhea, constipation, blood in stool and melena.  Genitourinary: Negative for dysuria, urgency, frequency, hematuria and flank pain.  Musculoskeletal: Negative for myalgias, back pain, joint pain and falls.  Skin: Negative for itching and rash.  Neurological: Negative for dizziness, tingling, tremors, sensory change, speech change, focal weakness, seizures, loss of consciousness, weakness and headaches.  Endo/Heme/Allergies: Negative for environmental allergies and polydipsia. Does not bruise/bleed easily.  SUBJECTIVE: resting comforrably this AM, off vasopressors No overt distress  VITAL SIGNS: Temp:  [97.9 F (36.6 C)-98.4 F (36.9 C)] 98.3 F (36.8 C) (12/29 0817) Pulse Rate:  [73-96] 73 (12/29 0600) Resp:  [13-29] 14 (12/29 0600) BP: (71-176)/(34-149) 176/64 (12/29 0600) SpO2:  [70 %-100 %] 98 % (12/29 0600)  PHYSICAL EXAMINATION: General:  Awake, alert, no acute distress Neuro:  Non-focal A and O times 3.  HEENT:  PERRLA, EOMI Cardiovascular:  RRR, no MRG Lungs:  CTA B;  Abdomen:  Soft, NT, ND Musculoskeletal:  Normal Skin:  WNL   Recent Labs Lab 10/25/16 2053 10/26/16 0437 10/27/16 0354  NA 136 136 134*  K 5.7* 5.4* 5.9*  CL 112* 113* 110  CO2 17* 16* 17*  BUN 60* 57* 53*  CREATININE 2.84* 2.83* 2.47*  GLUCOSE 100* 74 187*    Recent Labs Lab 10/25/16 2118 10/26/16 0437 10/27/16 0354  HGB 8.1* 7.6* 10.7*  HCT 25.0* 23.0* 31.0*  WBC 8.4 9.2 13.8*  PLT 163 179 255   [place] Venous Img Lower Bilateral  Result Date: 10/26/2016 CLINICAL DATA:  Bilateral leg pain. Shortness of breath. Evaluate for DVT. EXAM: BILATERAL LOWER EXTREMITY VENOUS DOPPLER ULTRASOUND TECHNIQUE: Gray-scale sonography with graded compression, as well as color Doppler and duplex ultrasound were performed  to evaluate the lower extremity deep venous systems from the level of the common femoral vein and including the common femoral,  femoral, profunda femoral, popliteal and calf veins including the posterior tibial, peroneal and gastrocnemius veins when visible. The superficial great saphenous vein was also interrogated. Spectral Doppler was utilized to evaluate flow at rest and with distal augmentation maneuvers in the common femoral, femoral and popliteal veins. COMPARISON:  None. FINDINGS: RIGHT LOWER EXTREMITY Common Femoral Vein: No evidence of thrombus. Normal compressibility, respiratory phasicity and response to augmentation. Saphenofemoral Junction: No evidence of thrombus. Normal compressibility and flow on color Doppler imaging. Profunda Femoral Vein: No evidence of thrombus. Normal compressibility and flow on color Doppler imaging. Femoral Vein: No evidence of thrombus. Normal compressibility, respiratory phasicity and response to augmentation. Popliteal Vein: No evidence of thrombus. Normal compressibility, respiratory phasicity and response to augmentation. Calf Veins: No evidence of thrombus. Normal compressibility and flow on color Doppler imaging. Superficial Great Saphenous Vein: No evidence of thrombus. Normal compressibility and flow on color Doppler imaging. Venous Reflux:  None. Other Findings:  None. LEFT LOWER EXTREMITY Common Femoral Vein: No evidence of thrombus. Normal compressibility, respiratory phasicity and response to augmentation. Saphenofemoral Junction: No evidence of thrombus. Normal compressibility and flow on color Doppler imaging. Profunda Femoral Vein: No evidence of thrombus. Normal compressibility and flow on color Doppler imaging. Femoral Vein: No evidence of thrombus. Normal compressibility, respiratory phasicity and response to augmentation. Popliteal Vein: No evidence of thrombus. Normal compressibility, respiratory phasicity and response to augmentation. Calf Veins: No evidence of thrombus. Normal compressibility and flow on color Doppler imaging. Superficial Great Saphenous Vein: No evidence of  thrombus. Normal compressibility and flow on color Doppler imaging. Venous Reflux:  None. Other Findings:  None. IMPRESSION: No evidence of DVT within either lower extremity. Electronically Signed   By: Simonne ComeJohn  Watts M.D.   On: 10/26/2016 12:12   Dg Chest Port 1 View  Result Date: 10/26/2016 CLINICAL DATA:  Congestive heart failure. EXAM: PORTABLE CHEST 1 VIEW COMPARISON:  11/10/2015 FINDINGS: The patchy infiltrates present in the left lung on the prior study have resolved with the exception of a small area of atelectasis at the left lung base medially. Right lung is clear. Slight cardiomegaly. Calcification of the thoracic aorta. IMPRESSION: Minimal atelectasis at the left lung base.  Chronic cardiomegaly. Aortic atherosclerosis. Electronically Signed   By: Francene BoyersJames  Maxwell M.D.   On: 10/26/2016 07:09    ASSESSMENT / PLAN: Hypotension secondary to hypovolemic shock Acute on chronic renal failure secondary to hypovolemic shock  Nausea, Vomiting, and Diarrhea Acute respiratory failure secondary to CHF exacerbation- improved.  Echo 09/15/15; EF=60% LE dopplers reviewed: negative.  CXR image 12/28 reviewed; mild LLL atelectasis, otherwise unremarkable.   Hx: Asthma  P: Supplemental O2 to maintain O2 sats >92% Empiric steroids Discontinue Carvedilol     Overall improved, no acute ICU issues at this time.  Ok to transfer to gen med floor if she stays off vasopressors.  Will sign off at this time, please call back with any questions.    Lucie LeatherKurian David Sabin Gibeault, M.D.  [facility]  Medical Director [facility] Medical Director [facility] Cardio[facility]

## 2016-10-27 NOTE — Evaluation (Signed)
Clinical/Bedside Swallow Evaluation Patient Details  Name: Misty Medina MRN: [id number] Date of Birth: 05/26/1941  Today's Date: 10/27/2016 Time: SLP Start Time (ACUTE ONLY): 1350 SLP Stop Time (ACUTE ONLY): 1430 SLP Time Calculation (min) (ACUTE ONLY): 40 min  Past Medical History:  Past Medical History:  Diagnosis Date  . Depression   . Fibromyalgia   . GERD (gastroesophageal reflux disease)   . Heart murmur   . Hypertension   . Hypothyroidism   . Osteoporosis    Past Surgical History:  Past Surgical History:  Procedure Laterality Date  . ABDOMINAL HYSTERECTOMY    . CHOLECYSTECTOMY    . ESOPHAGOGASTRODUODENOSCOPY (EGD) WITH PROPOFOL N/A 08/29/2016   Procedure: ESOPHAGOGASTRODUODENOSCOPY (EGD) WITH PROPOFOL;  Surgeon: Ula Lingoichard Lee, MD;  [facility];  Service: Endoscopy;  Laterality: N/A;   HPI:  Misty Medina  is a 75 y.o. female who presents with Nausea vomiting and diarrhea for the past several days. Patient denies any recent antibiotic use, she denies any fevers/chills, abdominal pain, or other infectious symptoms. On lab evaluation here she was found to have acute on chronic renal injury in the setting of profound dehydration. Hospitalists were called for admission. MD reported that pt stated she could not swallow and that she feels like she is dying.    Assessment / Plan / Recommendation Clinical Impression  Pt presents w/moderate oral dysphagia c/b decreased mastication and decreased clearing of soft solids and solid consistencies. Pt given ice chips, thin liquids, puree, soft solid and solid consistencies. No overt s/s of aspiration were observed w/any tested consistency. Vocal quality remained clear throughout and laryngeal elevation appeared adequate. Pt was edentulous and does not have dentures with her. Pt demonstrated difficulty masticating and clearing soft solid and solid. Pt was not able to clear solid and had to expectorate. No pocketing or holding was  observed, aside from difficulty clearing solids. After several trials of puree, pt reported that it felt like a bite was stuck in her throat, gave pt a liquid wash and she reported that the sensation cleared. Oral mech exam revealed generalized mild oral motor weakness. Recommend altering diet to Dysphagia I w/thin liquidsl; aspiration precautions. Please assist pt with meals and alternate bites and sips. Will f/u in 1-2 days. Nsg and pt in agreement.      Aspiration Risk  Mild aspiration risk    Diet Recommendation Dysphagia 1 (Puree);Thin liquid   Liquid Administration via: Straw Medication Administration: Whole meds with liquid Supervision: Staff to assist with self feeding Compensations: Minimize environmental distractions;Slow rate;Small sips/bites;Follow solids with liquid Postural Changes: Seated upright at 90 degrees    Other  Recommendations Oral Care Recommendations: Oral care BID;Staff/trained caregiver to provide oral care   Follow up Recommendations  (TBD)      Frequency and Duration min 3x week  1 week       Prognosis Prognosis for Safe Diet Advancement: Fair      Swallow Study   General Date of Onset: 10/25/16 HPI: Misty Medina  is a 75 y.o. female who presents with Nausea vomiting and diarrhea for the past several days. Patient denies any recent antibiotic use, she denies any fevers/chills, abdominal pain, or other infectious symptoms. On lab evaluation here she was found to have acute on chronic renal injury in the setting of profound dehydration. Hospitalists were called for admission. MD reported that pt stated she could not swallow and that she feels like she is dying.  Type of Study: Bedside Swallow Evaluation Previous Swallow  Assessment: none  Diet Prior to this Study: Regular;Thin liquids Temperature Spikes Noted: No Respiratory Status: Nasal cannula History of Recent Intubation: No Behavior/Cognition: Alert;Cooperative Oral Cavity Assessment: Dry Oral  Care Completed by SLP: No Oral Cavity - Dentition: Edentulous;Dentures, not available Vision: Functional for self-feeding Self-Feeding Abilities: Total assist Patient Positioning: Upright in bed Baseline Vocal Quality: Normal Volitional Cough: Strong Volitional Swallow: Able to elicit    Oral/Motor/Sensory Function Overall Oral Motor/Sensory Function: Generalized oral weakness   Ice Chips Ice chips: Within functional limits Presentation: Spoon Other Comments: 1 tsp   Thin Liquid Thin Liquid: Within functional limits Presentation: Straw Other Comments: 8 ounces of thin via straw    Nectar Thick Nectar Thick Liquid: Not tested   Honey Thick Honey Thick Liquid: Not tested   Puree Puree: Within functional limits Presentation: Spoon Other Comments: 4 ounces of puree   Solid   GO   Solid: Impaired Presentation: Spoon Oral Phase Impairments: Impaired mastication Oral Phase Functional Implications: Impaired mastication;Oral residue;Prolonged oral transit Other Comments: Attempted bite of solid and 2 bites of soft solid. Pt demonstrated impaired mastication and decreased oral clearing with both solid and soft solid consistencies.         [place],Misty Medina 10/27/2016,2:41 PM

## 2016-10-27 NOTE — Progress Notes (Signed)
Pt said she is ready for Hospice to take her and then ready for heaven. Pt estranged from grandson with whom she has been living. CH offered prayer.   10/27/16 1520  Clinical Encounter Type  Visited With Patient  Visit Type Critical Care  Referral From Nurse  Spiritual Encounters  Spiritual Needs Prayer;Emotional  Stress Factors  Patient Stress Factors Exhausted;Family relationships;Health changes;Lack of caregivers

## 2016-10-27 NOTE — Progress Notes (Signed)
Spoke to Dr. Hilton SinclairWeiting- updated him on how patient previously during shift alert and oriented- calm- now patient is stating that she thinks she is dying and thinks she is in organ failure.  Patient off levophed since 6am today- vitals stable.  Patient swallowed pills fine and ate bites of egg and tolerated well this morning- Patient was evaluated by speech therapy this afternoon- now on a dyshaphia diet.  Patient tolerates meds whole in applesauce.  Patient to go to CT of head today.  Recent potassium 5- patient still has not had a BM- Dr. Hilton SinclairWeiting aware.

## 2016-10-27 NOTE — Progress Notes (Signed)
Pt had moved to [facility] from ICU. Pt requested prayer which CH provided.    10/27/16 2100  Clinical Encounter Type  Visited With Patient;Health care provider  Visit Type Follow-up  Referral From Nurse  Spiritual Encounters  Spiritual Needs Prayer;Emotional  Stress Factors  Patient Stress Factors Health changes

## 2016-10-27 NOTE — Progress Notes (Signed)
Patient ID: Misty Medina, female   DOB: 08/15/1941, 75 y.o.   MRN: [id number]  [facility] PROGRESS NOTE  Misty PartridgeBrenda G Medina EXB:[id number] DOB: 04/10/1941 DOA: 10/25/2016 PCP: Luna FuseEJAN-SIE, SHEIKH AHMED, MD  HPI/Subjective: The patient states that she is dying and asked for the chaplain. She states that she has no feeling throughout her body. She states she can't swallow. Not feeling well at all. She refuses for me to call any family members.  Objective: Vitals:   10/27/16 0817 10/27/16 1550  BP:    Pulse:    Resp:    Temp: 98.3 F (36.8 C) 98.5 F (36.9 C)    Filed Weights   10/25/16 2050 10/26/16 0047 10/26/16 0715  Weight: 70.3 kg (155 lb) 74.4 kg (164 lb 1.6 oz) 77.2 kg (170 lb 3.1 oz)    ROS: Review of Systems  Constitutional: Negative for chills and fever.  Eyes: Negative for blurred vision.  Respiratory: Positive for shortness of breath. Negative for cough.   Cardiovascular: Negative for chest pain.  Gastrointestinal: Positive for abdominal pain. Negative for constipation, diarrhea, nausea and vomiting.  Genitourinary: Negative for dysuria.  Musculoskeletal: Negative for joint pain.  Neurological: Positive for sensory change and weakness. Negative for dizziness and headaches.   Exam: Physical Exam  HENT:  Nose: No mucosal edema.  Mouth/Throat: No oropharyngeal exudate or posterior oropharyngeal edema.  Eyes: Conjunctivae, EOM and lids are normal. Pupils are equal, round, and reactive to light.  Neck: No JVD present. Carotid bruit is not present. No edema present. No thyroid mass and no thyromegaly present.  Cardiovascular: S1 normal and S2 normal.  Exam reveals no gallop.   No murmur heard. Pulses:      Dorsalis pedis pulses are 2+ on the right side, and 2+ on the left side.  Respiratory: No respiratory distress. She has decreased breath sounds in the right lower field and the left lower field. She has no wheezes. She has no rhonchi. She has no rales.  GI: Soft.  Bowel sounds are normal. There is tenderness.  Musculoskeletal:       Right ankle: She exhibits swelling.       Left ankle: She exhibits swelling.  Lymphadenopathy:    She has no cervical adenopathy.  Neurological: She is alert.  Able to wiggle toes and since that on touching her feet. Able to lift both arms up off the bed.  Skin: Skin is warm. No rash noted. Nails show no clubbing.  Psychiatric: Her mood appears anxious. She is slowed.      Data Reviewed: Basic Metabolic Panel:  Recent Labs Lab 10/25/16 2053 10/26/16 0437 10/27/16 0354 10/27/16 1051  NA 136 136 134*  --   K 5.7* 5.4* 5.9* 5.0  CL 112* 113* 110  --   CO2 17* 16* 17*  --   GLUCOSE 100* 74 187*  --   BUN 60* 57* 53*  --   CREATININE 2.84* 2.83* 2.47*  --   CALCIUM 7.6* 7.6* 8.3*  --   MG 3.2*  --   --   --    Liver Function Tests:  Recent Labs Lab 10/25/16 2053 10/27/16 0354  AST 45* 23  ALT 22 19  ALKPHOS 111 125  BILITOT 0.8 0.7  PROT 5.3* 6.4*  ALBUMIN 2.7* 3.2*    Recent Labs Lab 10/25/16 2053  LIPASE 10*   CBC:  Recent Labs Lab 10/25/16 2118 10/26/16 0437 10/27/16 0354  WBC 8.4 9.2 13.8*  NEUTROABS 6.4  --   --  HGB 8.1* 7.6* 10.7*  HCT 25.0* 23.0* 31.0*  MCV 91.3 92.9 90.4  PLT 163 179 255   Cardiac Enzymes:  Recent Labs Lab 10/25/16 2053 10/26/16 1610 10/26/16 2150 10/27/16 0354  TROPONINI <0.03 0.03* <0.03 <0.03     Recent Results (from the past 240 hour(s))  MRSA PCR Screening     Status: None   Collection Time: 10/26/16  7:17 AM  Result Value Ref Range Status   MRSA by PCR NEGATIVE NEGATIVE Final    Comment:        The GeneXpert MRSA Assay (FDA approved for NASAL specimens only), is one component of a comprehensive MRSA colonization surveillance program. It is not intended to diagnose MRSA infection nor to guide or monitor treatment for MRSA infections.   Culture, blood (Routine X 2) w Reflex to ID Panel     Status: None (Preliminary result)    Collection Time: 10/26/16  4:10 PM  Result Value Ref Range Status   Specimen Description BLOOD RIGHT ARM  Final   Special Requests   Final    BOTTLES DRAWN AEROBIC AND ANAEROBIC 10MLAERO,8MLANA   Culture NO GROWTH < 24 HOURS  Final   Report Status PENDING  Incomplete  Culture, blood (Routine X 2) w Reflex to ID Panel     Status: None (Preliminary result)   Collection Time: 10/26/16  4:10 PM  Result Value Ref Range Status   Specimen Description BLOOD LEFT HAND  Final   Special Requests BOTTLES DRAWN AEROBIC AND ANAEROBIC 6MLAERO,6MLANA  Final   Culture NO GROWTH < 24 HOURS  Final   Report Status PENDING  Incomplete     Studies: US Venous Img Lower Bilateral  Result Date: 10/26/2016 CLINICAL DATA:  Bilateral leg pain. Shortness of breath. Evaluate for DVT. EXAM: BILATERAL LOWER EXTREMITY VENOUS DOPPLER ULTRASOUND TECHNIQUE: Gray-scale sonography with graded compression, as well as color Doppler and duplex ultrasound were performed to evaluate the lower extremity deep venous systems from the level of the common femoral vein and including the common femoral, femoral, profunda femoral, popliteal and calf veins including the posterior tibial, peroneal and gastrocnemius veins when visible. The superficial great saphenous vein was also interrogated. Spectral Doppler was utilized to evaluate flow at rest and with distal augmentation maneuvers in the common femoral, femoral and popliteal veins. COMPARISON:  None. FINDINGS: RIGHT LOWER EXTREMITY Common Femoral Vein: No evidence of thrombus. Normal compressibility, respiratory phasicity and response to augmentation. Saphenofemoral Junction: No evidence of thrombus. Normal compressibility and flow on color Doppler imaging. Profunda Femoral Vein: No evidence of thrombus. Normal compressibility and flow on color Doppler imaging. Femoral Vein: No evidence of thrombus. Normal compressibility, respiratory phasicity and response to augmentation. Popliteal Vein: No  evidence of thrombus. Normal compressibility, respiratory phasicity and response to augmentation. Calf Veins: No evidence of thrombus. Normal compressibility and flow on color Doppler imaging. Superficial Great Saphenous Vein: No evidence of thrombus. Normal compressibility and flow on color Doppler imaging. Venous Reflux:  None. Other Findings:  None. LEFT LOWER EXTREMITY Common Femoral Vein: No evidence of thrombus. Normal compressibility, respiratory phasicity and response to augmentation. Saphenofemoral Junction: No evidence of thrombus. Normal compressibility and flow on color Doppler imaging. Profunda Femoral Vein: No evidence of thrombus. Normal compressibility and flow on color Doppler imaging. Femoral Vein: No evidence of thrombus. Normal compressibility, respiratory phasicity and response to augmentation. Popliteal Vein: No evidence of thrombus. Normal compressibility, respiratory phasicity and response to augmentation. Calf Veins: No evidence of thrombus. Normal compressibility and flow  on color Doppler imaging. Superficial Great Saphenous Vein: No evidence of thrombus. Normal compressibility and flow on color Doppler imaging. Venous Reflux:  None. Other Findings:  None. IMPRESSION: No evidence of DVT within either lower extremity. Electronically Signed   By: Simonne Come M.D.   On: 10/26/2016 12:12   Dg Chest Port 1 View  Result Date: 10/26/2016 CLINICAL DATA:  Congestive heart failure. EXAM: PORTABLE CHEST 1 VIEW COMPARISON:  11/10/2015 FINDINGS: The patchy infiltrates present in the left lung on the prior study have resolved with the exception of a small area of atelectasis at the left lung base medially. Right lung is clear. Slight cardiomegaly. Calcification of the thoracic aorta. IMPRESSION: Minimal atelectasis at the left lung base.  Chronic cardiomegaly. Aortic atherosclerosis. Electronically Signed   By: Francene Boyers M.D.   On: 10/26/2016 07:09    Scheduled Meds: . baclofen  10 mg Oral  TID  . budesonide (PULMICORT) nebulizer solution  0.5 mg Nebulization BID  . busPIRone  15 mg Oral BID  . citalopram  40 mg Oral Daily  . heparin  5,000 Units Subcutaneous Q8H  . ipratropium-albuterol  3 mL Nebulization Q6H  . levothyroxine  75 mcg Oral QAC breakfast  . pantoprazole  40 mg Oral BID AC  . [START ON 10/28/2016] pregabalin  25 mg Oral Daily   Continuous Infusions: . sodium chloride 30 mL/hr at 10/27/16 1610    Assessment/Plan:  1. Hypovolemic shock. Patient Off levophed since early this morning. Patient received IV fluid boluses. Patient received a unit of blood yesterday. Gentle IV fluids started today. Stress dose steroids stopped. As per critical care specialist stable for transfer to the floor. 2. Sensory change throughout her body. CT scan of the head ordered. 3. Acute kidney injury on chronic kidney disease stage III. This is ATN secondary to hypotension and shock. Gentle IV fluids ordered. 4. Hyperkalemia of 5.9 this morning. Nighttime physician gave Kayexalate and patient has not had a bowel movement yet. I ordered shorter acting medication and repeat potassium 5.0. Repeat BMP tomorrow morning. 5. Acute hypoxic respiratory failure with Asthma exacerbation. Continue nebulizer treatments. Continue oxygen. Critical care specialist stop steroids. 6. Diarrhea. No bowel movement since being here 7. Hypothyroidism unspecified on levothyroxine 8. GERD and recent gastric ulcer on endoscopy on Protonix 9. Restless leg syndrome, discontinue Mirapex 10. Fibromyalgia on Lyrica (decrease dose) and baclofen 11. Depression continue psychiatric medication. We'll get psychiatric consultation because the patient states she is dying and has some odd complaints.  Code Status:     Code Status Orders        Start     Ordered   10/26/16 0026  Do not attempt resuscitation (DNR)  Continuous    Question Answer Comment  In the event of cardiac or respiratory ARREST Do not call a "code  blue"   In the event of cardiac or respiratory ARREST Do not perform Intubation, CPR, defibrillation or ACLS   In the event of cardiac or respiratory ARREST Use medication by any route, position, wound care, and other measures to relive pain and suffering. May use oxygen, suction and manual treatment of airway obstruction as needed for comfort.      10/26/16 0025    Code Status History    Date Active Date Inactive Code Status Order ID Comments User Context   08/28/2016  1:22 PM 09/03/2016  3:21 AM Full Code [id number]  Shaune Pollack, MD Inpatient   09/14/2015 10:36 PM 09/16/2015  9:58 PM Full  Code [id number]  Oralia Manisavid Willis, MD Inpatient    Advance Directive Documentation   Flowsheet Row Most Recent Value  Type of Advance Directive  Healthcare Power of Attorney, Living will  Pre-existing out of facility DNR order (yellow form or pink MOST form)  No data  "MOST" Form in Place?  No data     Disposition Plan: Physical therapy recommended rehabilitation  Consultants:  Critical care specialist  Time spent: 25 minutes. Patient refused me to call family  Alford HighlandWIETING, Meleny Tregoning  [employer] [facility]

## 2016-10-28 DIAGNOSIS — F333 Major depressive disorder, recurrent, severe with psychotic symptoms: Secondary | ICD-10-CM

## 2016-10-28 LAB — BASIC METABOLIC PANEL
Anion gap: 7 (ref 5–15)
BUN: 48 mg/dL — AB (ref 6–20)
CO2: 19 mmol/L — ABNORMAL LOW (ref 22–32)
Calcium: 8.5 mg/dL — ABNORMAL LOW (ref 8.9–10.3)
Chloride: 111 mmol/L (ref 101–111)
Creatinine, Ser: 1.99 mg/dL — ABNORMAL HIGH (ref 0.44–1.00)
GFR calc non Af Amer: 23 mL/min — ABNORMAL LOW (ref >60)
GFR, EST AFRICAN AMERICAN: 27 mL/min — AB (ref >60)
Glucose, Bld: 86 mg/dL (ref 65–99)
Potassium: 4.3 mmol/L (ref 3.5–5.1)
SODIUM: 137 mmol/L (ref 135–145)

## 2016-10-28 LAB — CBC
HCT: 29.7 % — ABNORMAL LOW (ref 35.0–47.0)
Hemoglobin: 10 g/dL — ABNORMAL LOW (ref 12.0–16.0)
MCH: 30.8 pg (ref 26.0–34.0)
MCHC: 33.6 g/dL (ref 32.0–36.0)
MCV: 91.6 fL (ref 80.0–100.0)
Platelets: 209 10*3/uL (ref 150–440)
RBC: 3.24 MIL/uL — ABNORMAL LOW (ref 3.80–5.20)
RDW: 15.9 % — AB (ref 11.5–14.5)
WBC: 11.1 10*3/uL — ABNORMAL HIGH (ref 3.6–11.0)

## 2016-10-28 LAB — PROCALCITONIN: PROCALCITONIN: 0.32 ng/mL

## 2016-10-28 MED ORDER — HALOPERIDOL 0.5 MG PO TABS
0.5000 mg | ORAL_TABLET | Freq: Two times a day (BID) | ORAL | Status: DC
  Administered 2016-10-28 – 2016-10-30 (×5): 0.5 mg via ORAL
  Filled 2016-10-28 (×5): qty 1

## 2016-10-28 NOTE — Progress Notes (Signed)
Pt refusing breathing tx, states she is "too far gone" and doesn't want "these things anymore" No SOB or distress noted.

## 2016-10-28 NOTE — Progress Notes (Signed)
Speech Therapy SLP contacted nursing to discuss pt status. Pt at baseline and requesting puree as she states she cannot swallow at this time. Pt presents much higher in function than she states, however slp will leave diet the same and continue to monitor due to pt concerns.  Misty MaineStacie Sauber, MA/CCC-SLP

## 2016-10-28 NOTE — Progress Notes (Signed)
Patient ID: Misty Medina, female   DOB: 12/28/1940, 75 y.o.   MRN: [id number]  [facility] PROGRESS NOTE  Misty Medina NWG:[id number] DOB: 06/10/1941 DOA: 10/25/2016 PCP: Luna FuseEJAN-SIE, SHEIKH AHMED, MD  HPI/Subjective:  Patient continued to complain of feeling of dying. Her renal function is improved   Objective: Vitals:   10/28/16 0734 10/28/16 1230  BP: (!) 161/89 117/79  Pulse: 99 91  Resp: 16 20  Temp: 98.5 F (36.9 C) 98 F (36.7 C)    Filed Weights   10/25/16 2050 10/26/16 0047 10/26/16 0715  Weight: 155 lb (70.3 kg) 164 lb 1.6 oz (74.4 kg) 170 lb 3.1 oz (77.2 kg)    ROS: Review of Systems  Constitutional: Negative for chills and fever.  Eyes: Negative for blurred vision.  Respiratory: Positive for shortness of breath. Negative for cough.   Cardiovascular: Negative for chest pain.  Gastrointestinal: Negative for abdominal pain, constipation, diarrhea, nausea and vomiting.  Genitourinary: Negative for dysuria.  Musculoskeletal: Negative for joint pain.  Neurological: Positive for sensory change and weakness. Negative for dizziness and headaches.   Exam: Physical Exam  HENT:  Nose: No mucosal edema.  Mouth/Throat: No oropharyngeal exudate or posterior oropharyngeal edema.  Eyes: Conjunctivae, EOM and lids are normal. Pupils are equal, round, and reactive to light.  Neck: No JVD present. Carotid bruit is not present. No edema present. No thyroid mass and no thyromegaly present.  Cardiovascular: S1 normal and S2 normal.  Exam reveals no gallop.   No murmur heard. Pulses:      Dorsalis pedis pulses are 2+ on the right side, and 2+ on the left side.  Respiratory: No respiratory distress. She has decreased breath sounds in the right lower field and the left lower field. She has no wheezes. She has no rhonchi. She has no rales.  GI: Soft. Bowel sounds are normal. There is tenderness.  Musculoskeletal:       Right ankle: She exhibits swelling.       Left ankle:  She exhibits swelling.  Lymphadenopathy:    She has no cervical adenopathy.  Neurological: She is alert.  Able to wiggle toes and since that on touching her feet. Able to lift both arms up off the bed.  Skin: Skin is warm. No rash noted. Nails show no clubbing.  Psychiatric: Her mood appears anxious. She is slowed.      Data Reviewed: Basic Metabolic Panel:  Recent Labs Lab 10/25/16 2053 10/26/16 0437 10/27/16 0354 10/27/16 1051 10/28/16 0549  NA 136 136 134*  --  137  K 5.7* 5.4* 5.9* 5.0 4.3  CL 112* 113* 110  --  111  CO2 17* 16* 17*  --  19*  GLUCOSE 100* 74 187*  --  86  BUN 60* 57* 53*  --  48*  CREATININE 2.84* 2.83* 2.47*  --  1.99*  CALCIUM 7.6* 7.6* 8.3*  --  8.5*  MG 3.2*  --   --   --   --    Liver Function Tests:  Recent Labs Lab 10/25/16 2053 10/27/16 0354  AST 45* 23  ALT 22 19  ALKPHOS 111 125  BILITOT 0.8 0.7  PROT 5.3* 6.4*  ALBUMIN 2.7* 3.2*    Recent Labs Lab 10/25/16 2053  LIPASE 10*   CBC:  Recent Labs Lab 10/25/16 2118 10/26/16 0437 10/27/16 0354 10/28/16 0549  WBC 8.4 9.2 13.8* 11.1*  NEUTROABS 6.4  --   --   --   HGB 8.1* 7.6*  10.7* 10.0*  HCT 25.0* 23.0* 31.0* 29.7*  MCV 91.3 92.9 90.4 91.6  PLT 163 179 255 209   Cardiac Enzymes:  Recent Labs Lab 10/25/16 2053 10/26/16 1610 10/26/16 2150 10/27/16 0354  TROPONINI <0.03 0.03* <0.03 <0.03     Recent Results (from the past 240 hour(s))  MRSA PCR Screening     Status: None   Collection Time: 10/26/16  7:17 AM  Result Value Ref Range Status   MRSA by PCR NEGATIVE NEGATIVE Final    Comment:        The GeneXpert MRSA Assay (FDA approved for NASAL specimens only), is one component of a comprehensive MRSA colonization surveillance program. It is not intended to diagnose MRSA infection nor to guide or monitor treatment for MRSA infections.   Culture, blood (Routine X 2) w Reflex to ID Panel     Status: None (Preliminary result)   Collection Time: 10/26/16   4:10 PM  Result Value Ref Range Status   Specimen Description BLOOD RIGHT ARM  Final   Special Requests   Final    BOTTLES DRAWN AEROBIC AND ANAEROBIC 10MLAERO,8MLANA   Culture NO GROWTH 2 DAYS  Final   Report Status PENDING  Incomplete  Culture, blood (Routine X 2) w Reflex to ID Panel     Status: None (Preliminary result)   Collection Time: 10/26/16  4:10 PM  Result Value Ref Range Status   Specimen Description BLOOD LEFT HAND  Final   Special Requests BOTTLES DRAWN AEROBIC AND ANAEROBIC 6MLAERO,6MLANA  Final   Culture NO GROWTH 2 DAYS  Final   Report Status PENDING  Incomplete     Studies: Ct Head Wo Contrast  Result Date: 10/27/2016 CLINICAL DATA:  Loss of feeling sensation. The patient states that she is dying and asked for the chaplain. She states that she has no feeling throughout her body. She states she can't swallow. Not feeling well at all. EXAM: CT HEAD WITHOUT CONTRAST TECHNIQUE: Contiguous axial images were obtained from the base of the skull through the vertex without intravenous contrast. COMPARISON:  06/30/2013 FINDINGS: Brain: There is central and cortical atrophy. Periventricular white matter changes are consistent with small vessel disease. There is no intra or extra-axial fluid collection or mass lesion. The basilar cisterns and ventricles have a normal appearance. There is no CT evidence for acute infarction or hemorrhage. Vascular: There is atherosclerotic calcification of the carotid siphons. Skull: Normal. Negative for fracture or focal lesion. Sinuses/Orbits: There is soft tissue opacity within the right maxillary sinus consistent with mucous retention cyst, polyp or mucosal thickening. No air-fluid levels are identified. The mastoid air cells are normally aerated. Other: Orbits are unremarkable. IMPRESSION: 1. Atrophy and small vessel disease. 2.  No evidence for acute intracranial abnormality. 3. Probable right maxillary sinus mucous retention cyst or polyp.  Electronically Signed   By: Norva PavlovElizabeth  Brown M.D.   On: 10/27/2016 18:07    Scheduled Meds: . baclofen  10 mg Oral TID  . budesonide (PULMICORT) nebulizer solution  0.5 mg Nebulization BID  . busPIRone  15 mg Oral BID  . citalopram  40 mg Oral Daily  . haloperidol  0.5 mg Oral BID  . heparin  5,000 Units Subcutaneous Q8H  . ipratropium-albuterol  3 mL Nebulization Q6H  . levothyroxine  75 mcg Oral QAC breakfast  . pantoprazole  40 mg Oral BID AC  . pregabalin  25 mg Oral Daily   Continuous Infusions: . sodium chloride 30 mL/hr at 10/27/16 1610  Assessment/Plan:  1. Hypovolemic shock. Patient's blood pressure normal 2. Sensory change throughout her body. CT scan of the head ordered. No clear etiology 3. Acute kidney injury on chronic kidney disease stage III. This is ATN secondary to hypotension and shock. Improved with IV fluids 4. Hyperkalemia of 5.9 this morning. Resolved  5. Acute hypoxic respiratory failure with Asthma exacerbation. Continue nebulizer treatments. Continue oxygen. Critical care specialist stop steroids. 6. Diarrhea. No bowel movement since being here 7. Hypothyroidism unspecified on levothyroxine 8. GERD and recent gastric ulcer on endoscopy on Protonix 9. Restless leg syndrome, discontinue Mirapex 10. Fibromyalgia on Lyrica (decrease dose) and baclofen 11. Depression continue psychiatric medication.seen by pychiatry  Code Status:     Code Status Orders        Start     Ordered   10/26/16 0026  Do not attempt resuscitation (DNR)  Continuous    Question Answer Comment  In the event of cardiac or respiratory ARREST Do not call a "code blue"   In the event of cardiac or respiratory ARREST Do not perform Intubation, CPR, defibrillation or ACLS   In the event of cardiac or respiratory ARREST Use medication by any route, position, wound care, and other measures to relive pain and suffering. May use oxygen, suction and manual treatment of airway obstruction  as needed for comfort.      10/26/16 0025    Code Status History    Date Active Date Inactive Code Status Order ID Comments User Context   08/28/2016  1:22 PM 09/03/2016  3:21 AM Full Code [id number]  Shaune Pollack, MD Inpatient   09/14/2015 10:36 PM 09/16/2015  9:58 PM Full Code [id number]  Oralia Manis, MD Inpatient    Advance Directive Documentation   Flowsheet Row Most Recent Value  Type of Advance Directive  Healthcare Power of Attorney, Living will  Pre-existing out of facility DNR order (yellow form or pink MOST form)  No data  "MOST" Form in Place?  No data     Disposition Plan: Physical therapy recommended rehabilitation  Consultants:  Critical care specialist  Time spent: 25 minutes. Patient refused me to call family  Merlene Dante, [facility]  [facility]

## 2016-10-28 NOTE — Consult Note (Signed)
[facility] Face-to-Face Psychiatry Consult   Reason for Consult: Depression. Referring Physician:  Dr. Leslye Peer Patient Identification: NEAVE LENGER MRN:  [id number] Principal Diagnosis: Severe recurrent major depressive disorder with psychotic features [facility]) Diagnosis:   Patient Active Problem List   Diagnosis Date Noted  . Acute kidney injury ([place]) [N17.9] 10/26/2016  . Nausea vomiting and diarrhea [R11.2, R19.7] 10/25/2016  . Acute on chronic renal failure ([place]) [N17.9, N18.9] 10/25/2016  . AKI (acute kidney injury) ([place]) [N17.9] 10/25/2016  . Gastric ulcer [K25.9] 09/02/2016  . E. coli UTI [N39.0, B96.20] 09/02/2016  . Aspiration pneumonia ([place]) [J69.0] 09/02/2016  . Acute blood loss anemia [D62] 09/02/2016  . Hypoxia [R09.02] 09/02/2016  . GIB (gastrointestinal bleeding) [K92.2] 08/28/2016  . Fracture of fifth toe, left, closed [S92.502A] 09/16/2015  . Blister of foot without infection [S90.829A] 09/16/2015  . Constipation [K59.00] 09/16/2015  . Falls [phone] 09/15/2015  . Frequent falls [R29.6] 09/14/2015  . Foot fracture, left [S92.902A] 09/14/2015  . Hypothyroidism [E03.9] 09/14/2015  . Chronic systolic CHF (congestive heart failure) ([place]) [I50.22] 09/14/2015  . HTN (hypertension) [I10] 09/14/2015  . GERD (gastroesophageal reflux disease) [K21.9] 09/14/2015  . Severe recurrent major depressive disorder with psychotic features ([place]) [F33.3] 09/14/2015    Total Time spent with patient: 45 minutes  Subjective:   Identifying data. TIANE SZYDLOWSKI is a 75 y.o. female with a history of depression and anxiety.  Chief complaint. "My organs are shutting down."  History of present illness. Information was obtained from the patient and the chart. The patient has a history of depression and anxiety with infrequent panic attacks. She has been treated with a combination of Celexa and BuSpar prescribed by her primary care physician with excellent results. 4 days ago and the patient  became physically ill with nausea, diarrhea, and vomiting. She was admitted to the hospital for dehydration. Consult was called because the patient felt that "she was dying". The patient reports that for the past 4 days she has felt that she is about to die and that her "organs are shutting down". She tells me that she is ready to "meet her maker" and wants to go "without pain and suffering". She is asking to see a chaplain again. She tells me that she does not wish her family to visit any more. When I told her that I would be back tomorrow, she assured me that she will be dead by then. On direct questioning, she denies any symptoms of depression, anxiety, or psychosis, symptoms suggestive of bipolar illness, or drug or alcohol use. She adamantly denies any thoughts, intentions, or plans to hurt herself or others.  Past psychiatric history. Mild depression and anxiety treated with Celexa and BuSpar by her primary provider. She has never been in a psychiatric hospital. There were no suicide attempts.  Family psychiatric history. Nonreported.  Social history. She lives with her grandson who helps her manage medication.  Risk to Self: Is patient at risk for suicide?: No Risk to Others:   Prior Inpatient Therapy:   Prior Outpatient Therapy:    Past Medical History:  Past Medical History:  Diagnosis Date  . Depression   . Fibromyalgia   . GERD (gastroesophageal reflux disease)   . Heart murmur   . Hypertension   . Hypothyroidism   . Osteoporosis     Past Surgical History:  Procedure Laterality Date  . ABDOMINAL HYSTERECTOMY    . CHOLECYSTECTOMY    . ESOPHAGOGASTRODUODENOSCOPY (EGD) WITH PROPOFOL N/A 08/29/2016   Procedure: ESOPHAGOGASTRODUODENOSCOPY (  EGD) WITH PROPOFOL;  Surgeon: Drinda Butts, MD;  Location[facility] ENDOSCOPY;  Service: Endoscopy;  Laterality: N/A;   Family History:  Family History  Problem Relation Age of Onset  . Hypertension Mother   . Osteoporosis Mother   . Arthritis  Father   . Heart attack Father    Social History:  History  Alcohol Use No     History  Drug Use No    Social History   Social History  . Marital status: Divorced    Spouse name: N/A  . Number of children: N/A  . Years of education: N/A   Social History Main Topics  . Smoking status: Never Smoker  . Smokeless tobacco: Never Used  . Alcohol use No  . Drug use: No  . Sexual activity: Not Asked   Other Topics Concern  . None   Social History Narrative  . None   Additional Social History:    Allergies:   Allergies  Allergen Reactions  . Codeine Nausea And Vomiting  . Penicillins Other (See Comments)    Reaction:  Unknown     Labs:  Results for orders placed or performed during the hospital encounter of 10/25/16 (from the past 48 hour(s))  Prepare RBC     Status: None   Collection Time: 10/26/16  2:26 PM  Result Value Ref Range   Order Confirmation ORDER PROCESSED BY BLOOD BANK   Type and screen [place]     Status: None   Collection Time: 10/26/16  2:26 PM  Result Value Ref Range   ABO/RH(D) AB POS    Antibody Screen NEG    Sample Expiration 10/29/2016    Unit Number [id number]    Blood Component Type RED CELLS,LR    Unit division 00    Status of Unit ISSUED,FINAL    Transfusion Status OK TO TRANSFUSE    Crossmatch Result Compatible   Culture, blood (Routine X 2) w Reflex to ID Panel     Status: None (Preliminary result)   Collection Time: 10/26/16  4:10 PM  Result Value Ref Range   Specimen Description BLOOD RIGHT ARM    Special Requests      BOTTLES DRAWN AEROBIC AND ANAEROBIC 10MLAERO,8MLANA   Culture NO GROWTH 2 DAYS    Report Status PENDING   Culture, blood (Routine X 2) w Reflex to ID Panel     Status: None (Preliminary result)   Collection Time: 10/26/16  4:10 PM  Result Value Ref Range   Specimen Description BLOOD LEFT HAND    Special Requests BOTTLES DRAWN AEROBIC AND ANAEROBIC 6MLAERO,6MLANA    Culture NO GROWTH  2 DAYS    Report Status PENDING   Procalcitonin - Baseline     Status: None   Collection Time: 10/26/16  4:10 PM  Result Value Ref Range   Procalcitonin 0.87 ng/mL    Comment:        Interpretation: PCT > 0.5 ng/mL and <= 2 ng/mL: Systemic infection (sepsis) is possible, but other conditions are known to elevate PCT as well. (NOTE)         ICU PCT Algorithm               Non ICU PCT Algorithm    ----------------------------     ------------------------------         PCT < 0.25 ng/mL                 PCT < 0.1 ng/mL  Stopping of antibiotics            Stopping of antibiotics       strongly encouraged.               strongly encouraged.    ----------------------------     ------------------------------       PCT level decrease by               PCT < 0.25 ng/mL       >= 80% from peak PCT       OR PCT 0.25 - 0.5 ng/mL          Stopping of antibiotics                                             encouraged.     Stopping of antibiotics           encouraged.    ----------------------------     ------------------------------       PCT level decrease by              PCT >= 0.25 ng/mL       < 80% from peak PCT        AND PCT >= 0.5 ng/mL             Continuing antibiotics                                              encouraged.       Continuing antibiotics            encouraged.    ----------------------------     ------------------------------     PCT level increase compared          PCT > 0.5 ng/mL         with peak PCT AND          PCT >= 0.5 ng/mL             Escalation of antibiotics                                          strongly encouraged.      Escalation of antibiotics        strongly encouraged.   Troponin I     Status: Abnormal   Collection Time: 10/26/16  4:10 PM  Result Value Ref Range   Troponin I 0.03 (HH) <0.03 ng/mL    Comment: CRITICAL RESULT CALLED TO, READ BACK BY AND VERIFIED WITH KELSEY WHATTS 10/26/16 1719 KLW   Lactic acid, plasma     Status: None    Collection Time: 10/26/16  4:11 PM  Result Value Ref Range   Lactic Acid, Venous 0.6 0.5 - 1.9 mmol/L  Troponin I     Status: None   Collection Time: 10/26/16  9:50 PM  Result Value Ref Range   Troponin I <0.03 <0.03 ng/mL  Troponin I     Status: None   Collection Time: 10/27/16  3:54 AM  Result Value Ref Range   Troponin I <0.03 <0.03 ng/mL  Procalcitonin     Status: None   Collection Time:  10/27/16  3:54 AM  Result Value Ref Range   Procalcitonin 0.61 ng/mL    Comment:        Interpretation: PCT > 0.5 ng/mL and <= 2 ng/mL: Systemic infection (sepsis) is possible, but other conditions are known to elevate PCT as well. (NOTE)         ICU PCT Algorithm               Non ICU PCT Algorithm    ----------------------------     ------------------------------         PCT < 0.25 ng/mL                 PCT < 0.1 ng/mL     Stopping of antibiotics            Stopping of antibiotics       strongly encouraged.               strongly encouraged.    ----------------------------     ------------------------------       PCT level decrease by               PCT < 0.25 ng/mL       >= 80% from peak PCT       OR PCT 0.25 - 0.5 ng/mL          Stopping of antibiotics                                             encouraged.     Stopping of antibiotics           encouraged.    ----------------------------     ------------------------------       PCT level decrease by              PCT >= 0.25 ng/mL       < 80% from peak PCT        AND PCT >= 0.5 ng/mL             Continuing antibiotics                                              encouraged.       Continuing antibiotics            encouraged.    ----------------------------     ------------------------------     PCT level increase compared          PCT > 0.5 ng/mL         with peak PCT AND          PCT >= 0.5 ng/mL             Escalation of antibiotics                                          strongly encouraged.      Escalation of antibiotics         strongly encouraged.   CBC     Status: Abnormal   Collection Time: 10/27/16  3:54 AM  Result Value Ref Range   WBC 13.8 (H) 3.6 - 11.0  K/uL   RBC 3.43 (L) 3.80 - 5.20 MIL/uL   Hemoglobin 10.7 (L) 12.0 - 16.0 g/dL    Comment: RESULT REPEATED AND VERIFIED   HCT 31.0 (L) 35.0 - 47.0 %   MCV 90.4 80.0 - 100.0 fL   MCH 31.1 26.0 - 34.0 pg   MCHC 34.5 32.0 - 36.0 g/dL   RDW 15.2 (H) 11.5 - 14.5 %   Platelets 255 150 - 440 K/uL  Comprehensive metabolic panel     Status: Abnormal   Collection Time: 10/27/16  3:54 AM  Result Value Ref Range   Sodium 134 (L) 135 - 145 mmol/L   Potassium 5.9 (H) 3.5 - 5.1 mmol/L   Chloride 110 101 - 111 mmol/L   CO2 17 (L) 22 - 32 mmol/L   Glucose, Bld 187 (H) 65 - 99 mg/dL   BUN 53 (H) 6 - 20 mg/dL   Creatinine, Ser 2.47 (H) 0.44 - 1.00 mg/dL   Calcium 8.3 (L) 8.9 - 10.3 mg/dL   Total Protein 6.4 (L) 6.5 - 8.1 g/dL   Albumin 3.2 (L) 3.5 - 5.0 g/dL   AST 23 15 - 41 U/L   ALT 19 14 - 54 U/L   Alkaline Phosphatase 125 38 - 126 U/L   Total Bilirubin 0.7 0.3 - 1.2 mg/dL   GFR calc non Af Amer 18 (L) >60 mL/min   GFR calc Af Amer 21 (L) >60 mL/min    Comment: (NOTE) The eGFR has been calculated using the CKD EPI equation. This calculation has not been validated in all clinical situations. eGFR's persistently <60 mL/min signify possible Chronic Kidney Disease.    Anion gap 7 5 - 15  Potassium     Status: None   Collection Time: 10/27/16 10:51 AM  Result Value Ref Range   Potassium 5.0 3.5 - 5.1 mmol/L  Procalcitonin     Status: None   Collection Time: 10/28/16  5:49 AM  Result Value Ref Range   Procalcitonin 0.32 ng/mL    Comment:        Interpretation: PCT (Procalcitonin) <= 0.5 ng/mL: Systemic infection (sepsis) is not likely. Local bacterial infection is possible. (NOTE)         ICU PCT Algorithm               Non ICU PCT Algorithm    ----------------------------     ------------------------------         PCT < 0.25 ng/mL                  PCT < 0.1 ng/mL     Stopping of antibiotics            Stopping of antibiotics       strongly encouraged.               strongly encouraged.    ----------------------------     ------------------------------       PCT level decrease by               PCT < 0.25 ng/mL       >= 80% from peak PCT       OR PCT 0.25 - 0.5 ng/mL          Stopping of antibiotics  encouraged.     Stopping of antibiotics           encouraged.    ----------------------------     ------------------------------       PCT level decrease by              PCT >= 0.25 ng/mL       < 80% from peak PCT        AND PCT >= 0.5 ng/mL            Continuin g antibiotics                                              encouraged.       Continuing antibiotics            encouraged.    ----------------------------     ------------------------------     PCT level increase compared          PCT > 0.5 ng/mL         with peak PCT AND          PCT >= 0.5 ng/mL             Escalation of antibiotics                                          strongly encouraged.      Escalation of antibiotics        strongly encouraged.   CBC     Status: Abnormal   Collection Time: 10/28/16  5:49 AM  Result Value Ref Range   WBC 11.1 (H) 3.6 - 11.0 K/uL   RBC 3.24 (L) 3.80 - 5.20 MIL/uL   Hemoglobin 10.0 (L) 12.0 - 16.0 g/dL   HCT 29.7 (L) 35.0 - 47.0 %   MCV 91.6 80.0 - 100.0 fL   MCH 30.8 26.0 - 34.0 pg   MCHC 33.6 32.0 - 36.0 g/dL   RDW 15.9 (H) 11.5 - 14.5 %   Platelets 209 150 - 440 K/uL  Basic metabolic panel     Status: Abnormal   Collection Time: 10/28/16  5:49 AM  Result Value Ref Range   Sodium 137 135 - 145 mmol/L   Potassium 4.3 3.5 - 5.1 mmol/L   Chloride 111 101 - 111 mmol/L   CO2 19 (L) 22 - 32 mmol/L   Glucose, Bld 86 65 - 99 mg/dL   BUN 48 (H) 6 - 20 mg/dL   Creatinine, Ser 1.99 (H) 0.44 - 1.00 mg/dL   Calcium 8.5 (L) 8.9 - 10.3 mg/dL   GFR calc non Af Amer 23 (L) >60 mL/min   GFR  calc Af Amer 27 (L) >60 mL/min    Comment: (NOTE) The eGFR has been calculated using the CKD EPI equation. This calculation has not been validated in all clinical situations. eGFR's persistently <60 mL/min signify possible Chronic Kidney Disease.    Anion gap 7 5 - 15    Current Facility-Administered Medications  Medication Dose Route Frequency Provider Last Rate Last Dose  . 0.9 %  sodium chloride infusion   Intravenous Continuous Loletha Grayer, MD 30 mL/hr at 10/27/16 1610    . acetaminophen (TYLENOL) tablet 650 mg  650 mg Oral Q6H PRN Lance Coon, MD  Or  . acetaminophen (TYLENOL) suppository 650 mg  650 mg Rectal Q6H PRN Lance Coon, MD      . baclofen (LIORESAL) tablet 10 mg  10 mg Oral TID Lance Coon, MD   10 mg at 10/28/16 1032  . budesonide (PULMICORT) nebulizer solution 0.5 mg  0.5 mg Nebulization BID Loletha Grayer, MD   0.5 mg at 10/27/16 2010  . busPIRone (BUSPAR) tablet 15 mg  15 mg Oral BID Lance Coon, MD   15 mg at 10/28/16 1031  . citalopram (CELEXA) tablet 40 mg  40 mg Oral Daily Lance Coon, MD   40 mg at 10/28/16 1031  . haloperidol (HALDOL) tablet 0.5 mg  0.5 mg Oral BID Clovis Fredrickson, MD      . heparin injection 5,000 Units  5,000 Units Subcutaneous Q8H Lance Coon, MD   5,000 Units at 10/28/16 0503  . HYDROcodone-acetaminophen (NORCO) 10-325 MG per tablet 1 tablet  1 tablet Oral Q6H PRN Lance Coon, MD   1 tablet at 10/28/16 0745  . ipratropium-albuterol (DUONEB) 0.5-2.5 (3) MG/3ML nebulizer solution 3 mL  3 mL Nebulization Q6H Loletha Grayer, MD   3 mL at 10/28/16 0115  . levothyroxine (SYNTHROID, LEVOTHROID) tablet 75 mcg  75 mcg Oral QAC breakfast Lance Coon, MD   75 mcg at 10/28/16 0736  . ondansetron (ZOFRAN) tablet 4 mg  4 mg Oral Q6H PRN Lance Coon, MD       Or  . ondansetron [facility]) injection 4 mg  4 mg Intravenous Q6H PRN Lance Coon, MD   4 mg at 10/26/16 1948  . pantoprazole (PROTONIX) EC tablet 40 mg  40 mg Oral BID AC Lance Coon, MD   40 mg at 10/28/16 0736  . pregabalin (LYRICA) capsule 25 mg  25 mg Oral Daily Loletha Grayer, MD   25 mg at 10/28/16 1032    Musculoskeletal: Strength & Muscle Tone: within normal limits Gait & Station: normal Patient leans: N/A  Psychiatric Specialty Exam: Physical Exam  Nursing note and vitals reviewed.   Review of Systems  Gastrointestinal: Positive for abdominal pain, diarrhea, nausea and vomiting.  Psychiatric/Behavioral: Positive for depression. The patient is nervous/anxious.   All other systems reviewed and are negative.   Blood pressure (!) 161/89, pulse 99, temperature 98.5 F (36.9 C), temperature source Oral, resp. rate 16, height 5' 3"  (1.6 m), weight 77.2 kg (170 lb 3.1 oz), SpO2 99 %.Body mass index is 30.15 kg/m.  General Appearance: Casual  Eye Contact:  Good  Speech:  Clear and Coherent  Volume:  Normal  Mood:  Depressed  Affect:  Blunt  Thought Process:  Goal Directed and Descriptions of Associations: Intact  Orientation:  Full (Time, Place, and Person)  Thought Content:  Delusions and Paranoid Ideation  Suicidal Thoughts:  No  Homicidal Thoughts:  No  Memory:  Immediate;   Fair Recent;   Fair Remote;   Fair  Judgement:  Impaired  Insight:  Shallow  Psychomotor Activity:  Normal  Concentration:  Concentration: Fair and Attention Span: Fair  Recall:  [employer] of Knowledge:  Fair  Language:  Fair  Akathisia:  No  Handed:  Right  AIMS (if indicated):     Assets:  Agricultural consultant Housing Social Support  ADL's:  Intact  Cognition:  WNL  Sleep:        Treatment Plan Summary: Daily contact with patient to assess and evaluate symptoms and progress in treatment and Medication management  PLAN:  1. Please continue Celexa and Buspar for depression and anxiety.  2. I will add low dose Haldol for paranoia.  3. I will follow up tomorrow.  Disposition: No evidence of imminent risk to self or  others at present.   Patient does not meet criteria for psychiatric inpatient admission. Supportive therapy provided about ongoing stressors. Discussed crisis plan, support from social network, calling 911, coming to the Emergency Department, and calling Suicide Hotline.  Orson Slick, MD 10/28/2016 12:07 PM

## 2016-10-28 NOTE — Plan of Care (Signed)
Problem: Pain Managment: Goal: General experience of comfort will improve Outcome: Progressing Patient complains of generalized pain.  Pain medication was given and patient is sleeping soundly.  Problem: Activity: Goal: Risk for activity intolerance will decrease Outcome: Not Progressing Patient is not ambulating at this time.  Problem: Activity: Goal: Capacity to carry out activities will improve Outcome: Not Progressing Patient has generalized weakness.  Problem: Education: Goal: Ability to demonstrate managment of disease process will improve Outcome: Not Progressing Patient needs assistance.  Problem: Health Behavior/Discharge Planning: Goal: Ability to manage health-related needs will improve for discharge Outcome: Not Progressing Patient needs assistance.

## 2016-10-29 LAB — BASIC METABOLIC PANEL
Anion gap: 5 (ref 5–15)
BUN: 58 mg/dL — ABNORMAL HIGH (ref 6–20)
CALCIUM: 8.4 mg/dL — AB (ref 8.9–10.3)
CO2: 20 mmol/L — AB (ref 22–32)
CREATININE: 2.57 mg/dL — AB (ref 0.44–1.00)
Chloride: 114 mmol/L — ABNORMAL HIGH (ref 101–111)
GFR calc non Af Amer: 17 mL/min — ABNORMAL LOW (ref >60)
GFR, EST AFRICAN AMERICAN: 20 mL/min — AB (ref >60)
Glucose, Bld: 120 mg/dL — ABNORMAL HIGH (ref 65–99)
Potassium: 4.7 mmol/L (ref 3.5–5.1)
Sodium: 139 mmol/L (ref 135–145)

## 2016-10-29 MED ORDER — MIRTAZAPINE 15 MG PO TABS
15.0000 mg | ORAL_TABLET | Freq: Every day | ORAL | Status: DC
  Administered 2016-10-29 – 2016-10-31 (×3): 15 mg via ORAL
  Filled 2016-10-29 (×3): qty 1

## 2016-10-29 MED ORDER — IPRATROPIUM-ALBUTEROL 0.5-2.5 (3) MG/3ML IN SOLN
3.0000 mL | Freq: Four times a day (QID) | RESPIRATORY_TRACT | Status: DC | PRN
  Administered 2016-10-31 – 2016-11-05 (×4): 3 mL via RESPIRATORY_TRACT
  Filled 2016-10-29 (×4): qty 3

## 2016-10-29 NOTE — Consult Note (Signed)
[facility] Face-to-Face Psychiatry Consult   Reason for Consult: Depression. Referring Physician:  Dr. Leslye Peer Patient Identification: Misty Medina MRN:  [id number] Principal Diagnosis: Severe recurrent major depressive disorder with psychotic features Misty Medina) Diagnosis:   Patient Active Problem List   Diagnosis Date Noted  . Acute kidney injury ([place]) [N17.9] 10/26/2016  . Nausea vomiting and diarrhea [R11.2, R19.7] 10/25/2016  . Acute on chronic renal failure ([place]) [N17.9, N18.9] 10/25/2016  . AKI (acute kidney injury) ([place]) [N17.9] 10/25/2016  . Gastric ulcer [K25.9] 09/02/2016  . E. coli UTI [N39.0, B96.20] 09/02/2016  . Aspiration pneumonia ([place]) [J69.0] 09/02/2016  . Acute blood loss anemia [D62] 09/02/2016  . Hypoxia [R09.02] 09/02/2016  . GIB (gastrointestinal bleeding) [K92.2] 08/28/2016  . Fracture of fifth toe, left, closed [S92.502A] 09/16/2015  . Blister of foot without infection [S90.829A] 09/16/2015  . Constipation [K59.00] 09/16/2015  . Falls [phone].XXXA] 09/15/2015  . Frequent falls [R29.6] 09/14/2015  . Foot fracture, left [S92.902A] 09/14/2015  . Hypothyroidism [E03.9] 09/14/2015  . Chronic systolic CHF (congestive heart failure) ([place]) [I50.22] 09/14/2015  . HTN (hypertension) [I10] 09/14/2015  . GERD (gastroesophageal reflux disease) [K21.9] 09/14/2015  . Severe recurrent major depressive disorder with psychotic features ([place]) [F33.3] 09/14/2015    Total Time spent with patient: 15 minutes   12/31 this is a follow up visit. Ms. Misty Medina appears more depressed than yesterday. Her voice is hardly audible, she seems somnolent. She hardly touched her lunch. She still believes that she is dying and will not be here to talk to me tomorrow. Still thinks that her "organs are shutting down". She is ready to "give up". She however denies any suicidal ideation or plans. Her family visited last night but the patient does not feel better. We will continue BuSpar, Celexa and low dose  Haldol. I will add Remeron 15 mg nightly for depression.    Identifying data. Misty Medina is a 75 y.o. female with a history of depression and anxiety.  Chief complaint. "My organs are shutting down."  History of present illness. Information was obtained from the patient and the chart. The patient has a history of depression and anxiety with infrequent panic attacks. She has been treated with a combination of Celexa and BuSpar prescribed by her primary care physician with excellent results. 4 days ago and the patient became physically ill with nausea, diarrhea, and vomiting. She was admitted to the hospital for dehydration. Consult was called because the patient felt that "she was dying". The patient reports that for the past 4 days she has felt that she is about to die and that her "organs are shutting down". She tells me that she is ready to "meet her maker" and wants to go "without pain and suffering". She is asking to see a chaplain again. She tells me that she does not wish her family to visit any more. When I told her that I would be back tomorrow, she assured me that she will be dead by then. On direct questioning, she denies any symptoms of depression, anxiety, or psychosis, symptoms suggestive of bipolar illness, or drug or alcohol use. She adamantly denies any thoughts, intentions, or plans to hurt herself or others.  Past psychiatric history. Mild depression and anxiety treated with Celexa and BuSpar by her primary provider. She has never been in a psychiatric hospital. There were no suicide attempts.  Family psychiatric history. Nonreported.  Social history. She lives with her grandson who helps her manage medication.  Risk to Self: Is  patient at risk for suicide?: No Risk to Others:   Prior Inpatient Therapy:   Prior Outpatient Therapy:    Past Medical History:  Past Medical History:  Diagnosis Date  . Depression   . Fibromyalgia   . GERD (gastroesophageal reflux disease)    . Heart murmur   . Hypertension   . Hypothyroidism   . Osteoporosis     Past Surgical History:  Procedure Laterality Date  . ABDOMINAL HYSTERECTOMY    . CHOLECYSTECTOMY    . ESOPHAGOGASTRODUODENOSCOPY (EGD) WITH PROPOFOL N/A 08/29/2016   Procedure: ESOPHAGOGASTRODUODENOSCOPY (EGD) WITH PROPOFOL;  Surgeon: Drinda Butts, MD;  Location: [facility] ENDOSCOPY;  Service: Endoscopy;  Laterality: N/A;   Family History:  Family History  Problem Relation Age of Onset  . Hypertension Mother   . Osteoporosis Mother   . Arthritis Father   . Heart attack Father    Social History:  History  Alcohol Use No     History  Drug Use No    Social History   Social History  . Marital status: Divorced    Spouse name: N/A  . Number of children: N/A  . Years of education: N/A   Social History Main Topics  . Smoking status: Never Smoker  . Smokeless tobacco: Never Used  . Alcohol use No  . Drug use: No  . Sexual activity: Not Asked   Other Topics Concern  . None   Social History Narrative  . None   Additional Social History:    Allergies:   Allergies  Allergen Reactions  . Codeine Nausea And Vomiting  . Penicillins Other (See Comments)    Reaction:  Unknown     Labs:  Results for orders placed or performed during the hospital encounter of 10/25/16 (from the past 48 hour(s))  Procalcitonin     Status: None   Collection Time: 10/28/16  5:49 AM  Result Value Ref Range   Procalcitonin 0.32 ng/mL    Comment:        Interpretation: PCT (Procalcitonin) <= 0.5 ng/mL: Systemic infection (sepsis) is not likely. Local bacterial infection is possible. (NOTE)         ICU PCT Algorithm               Non ICU PCT Algorithm    ----------------------------     ------------------------------         PCT < 0.25 ng/mL                 PCT < 0.1 ng/mL     Stopping of antibiotics            Stopping of antibiotics       strongly encouraged.               strongly encouraged.     ----------------------------     ------------------------------       PCT level decrease by               PCT < 0.25 ng/mL       >= 80% from peak PCT       OR PCT 0.25 - 0.5 ng/mL          Stopping of antibiotics                                             encouraged.     Stopping  of antibiotics           encouraged.    ----------------------------     ------------------------------       PCT level decrease by              PCT >= 0.25 ng/mL       < 80% from peak PCT        AND PCT >= 0.5 ng/mL            Continuin g antibiotics                                              encouraged.       Continuing antibiotics            encouraged.    ----------------------------     ------------------------------     PCT level increase compared          PCT > 0.5 ng/mL         with peak PCT AND          PCT >= 0.5 ng/mL             Escalation of antibiotics                                          strongly encouraged.      Escalation of antibiotics        strongly encouraged.   CBC     Status: Abnormal   Collection Time: 10/28/16  5:49 AM  Result Value Ref Range   WBC 11.1 (H) 3.6 - 11.0 K/uL   RBC 3.24 (L) 3.80 - 5.20 MIL/uL   Hemoglobin 10.0 (L) 12.0 - 16.0 g/dL   HCT 29.7 (L) 35.0 - 47.0 %   MCV 91.6 80.0 - 100.0 fL   MCH 30.8 26.0 - 34.0 pg   MCHC 33.6 32.0 - 36.0 g/dL   RDW 15.9 (H) 11.5 - 14.5 %   Platelets 209 150 - 440 K/uL  Basic metabolic panel     Status: Abnormal   Collection Time: 10/28/16  5:49 AM  Result Value Ref Range   Sodium 137 135 - 145 mmol/L   Potassium 4.3 3.5 - 5.1 mmol/L   Chloride 111 101 - 111 mmol/L   CO2 19 (L) 22 - 32 mmol/L   Glucose, Bld 86 65 - 99 mg/dL   BUN 48 (H) 6 - 20 mg/dL   Creatinine, Ser 1.99 (H) 0.44 - 1.00 mg/dL   Calcium 8.5 (L) 8.9 - 10.3 mg/dL   GFR calc non Af Amer 23 (L) >60 mL/min   GFR calc Af Amer 27 (L) >60 mL/min    Comment: (NOTE) The eGFR has been calculated using the CKD EPI equation. This calculation has not been  validated in all clinical situations. eGFR's persistently <60 mL/min signify possible Chronic Kidney Disease.    Anion gap 7 5 - 15  Basic metabolic panel     Status: Abnormal   Collection Time: 10/29/16 10:40 AM  Result Value Ref Range   Sodium 139 135 - 145 mmol/L   Potassium 4.7 3.5 - 5.1 mmol/L   Chloride 114 (H) 101 - 111 mmol/L   CO2 20 (L) 22 - 32 mmol/L   Glucose, Bld 120 (  H) 65 - 99 mg/dL   BUN 58 (H) 6 - 20 mg/dL   Creatinine, Ser 2.57 (H) 0.44 - 1.00 mg/dL   Calcium 8.4 (L) 8.9 - 10.3 mg/dL   GFR calc non Af Amer 17 (L) >60 mL/min   GFR calc Af Amer 20 (L) >60 mL/min    Comment: (NOTE) The eGFR has been calculated using the CKD EPI equation. This calculation has not been validated in all clinical situations. eGFR's persistently <60 mL/min signify possible Chronic Kidney Disease.    Anion gap 5 5 - 15    Current Facility-Administered Medications  Medication Dose Route Frequency Provider Last Rate Last Dose  . acetaminophen (TYLENOL) tablet 650 mg  650 mg Oral Q6H PRN Lance Coon, MD       Or  . acetaminophen (TYLENOL) suppository 650 mg  650 mg Rectal Q6H PRN Lance Coon, MD      . baclofen (LIORESAL) tablet 10 mg  10 mg Oral TID Lance Coon, MD   10 mg at 10/29/16 0906  . busPIRone (BUSPAR) tablet 15 mg  15 mg Oral BID Lance Coon, MD   15 mg at 10/29/16 5638  . citalopram (CELEXA) tablet 40 mg  40 mg Oral Daily Lance Coon, MD   40 mg at 10/29/16 0906  . haloperidol (HALDOL) tablet 0.5 mg  0.5 mg Oral BID Clovis Fredrickson, MD   0.5 mg at 10/29/16 0906  . heparin injection 5,000 Units  5,000 Units Subcutaneous Q8H Lance Coon, MD   5,000 Units at 10/29/16 [phone]  . HYDROcodone-acetaminophen (NORCO) 10-325 MG per tablet 1 tablet  1 tablet Oral Q6H PRN Lance Coon, MD   1 tablet at 10/29/16 1254  . ipratropium-albuterol (DUONEB) 0.5-2.5 (3) MG/3ML nebulizer solution 3 mL  3 mL Nebulization Q6H PRN Dustin Flock, MD      . levothyroxine (SYNTHROID, LEVOTHROID)  tablet 75 mcg  75 mcg Oral QAC breakfast Lance Coon, MD   75 mcg at 10/29/16 0855  . mirtazapine (REMERON) tablet 15 mg  15 mg Oral QHS Earlee Herald B Hayly Litsey, MD      . ondansetron (ZOFRAN) tablet 4 mg  4 mg Oral Q6H PRN Lance Coon, MD       Or  . ondansetron [facility]) injection 4 mg  4 mg Intravenous Q6H PRN Lance Coon, MD   4 mg at 10/26/16 1948  . pantoprazole (PROTONIX) EC tablet 40 mg  40 mg Oral BID AC Lance Coon, MD   40 mg at 10/29/16 0855  . pregabalin (LYRICA) capsule 25 mg  25 mg Oral Daily Loletha Grayer, MD   25 mg at 10/29/16 0906    Musculoskeletal: Strength & Muscle Tone: within normal limits Gait & Station: normal Patient leans: N/A  Psychiatric Specialty Exam: Physical Exam  Nursing note and vitals reviewed.   Review of Systems  Gastrointestinal: Positive for abdominal pain, diarrhea, nausea and vomiting.  Psychiatric/Behavioral: Positive for depression. The patient is nervous/anxious.   All other systems reviewed and are negative.   Blood pressure 103/64, pulse 92, temperature 99.4 F (37.4 C), temperature source Oral, resp. rate 18, height _0  (1.6 m), weight 77.2 kg (170 lb 3.1 oz), SpO2 91 %.Body mass index is 30.15 kg/m.  General Appearance: Casual  Eye Contact:  Good  Speech:  Clear and Coherent  Volume:  Normal  Mood:  Depressed  Affect:  Blunt  Thought Process:  Goal Directed and Descriptions of Associations: Intact  Orientation:  Full (Time, Place, and Person)  Thought Content:  Delusions and Paranoid Ideation  Suicidal Thoughts:  No  Homicidal Thoughts:  No  Memory:  Immediate;   Fair Recent;   Fair Remote;   Fair  Judgement:  Impaired  Insight:  Shallow  Psychomotor Activity:  Normal  Concentration:  Concentration: Fair and Attention Span: Fair  Recall:  [employer] of Knowledge:  Fair  Language:  Fair  Akathisia:  No  Handed:  Right  AIMS (if indicated):     Assets:  Sales promotion account executive Housing Social Support  ADL's:  Intact  Cognition:  WNL  Sleep:        Treatment Plan Summary: Daily contact with patient to assess and evaluate symptoms and progress in treatment and Medication management   PLAN:  1. Please continue Celexa, Buspar for depression and anxiety and Haldol for psychosis.  2. I will add Remeron 15 mg tonight for depression.   3. Dr. Weber Cooks will follow up tomorrow.  Disposition: No evidence of imminent risk to self or others at present.   Patient does not meet criteria for psychiatric inpatient admission. Supportive therapy provided about ongoing stressors. Discussed crisis plan, support from social network, calling 911, coming to the Emergency Department, and calling Suicide Hotline.  Orson Slick, MD 10/29/2016 2:16 PM

## 2016-10-29 NOTE — Progress Notes (Signed)
Patient ID: Misty Medina, female   DOB: 03/12/1941, 75 y.o.   MRN: [id number]  [employer] PROGRESS NOTE  Misty Medina NWG:[id number] DOB: 06/06/1941 DOA: 10/25/2016 PCP: Bluford MainEJAN-SIE, SHEIKH AHMED, MD  HPI/Subjective:  She is reporting pain all over her body. Denies any chest pain or shortness of breath   Objective: Vitals:   10/29/16 1237 10/29/16 1311  BP: 103/64   Pulse: 90 87  Resp: 18   Temp: 99.4 F (37.4 C)     Filed Weights   10/25/16 2050 10/26/16 0047 10/26/16 0715  Weight: 155 lb (70.3 kg) 164 lb 1.6 oz (74.4 kg) 170 lb 3.1 oz (77.2 kg)    ROS: Review of Systems  Constitutional: Negative for chills and fever.  Eyes: Negative for blurred vision.  Respiratory: Positive for shortness of breath. Negative for cough.   Cardiovascular: Negative for chest pain.  Gastrointestinal: Negative for abdominal pain, constipation, diarrhea, nausea and vomiting.  Genitourinary: Negative for dysuria.  Musculoskeletal: Negative for joint pain.  Neurological: Positive for weakness. Negative for dizziness, sensory change and headaches.   Exam: Physical Exam  HENT:  Nose: No mucosal edema.  Mouth/Throat: No oropharyngeal exudate or posterior oropharyngeal edema.  Eyes: Conjunctivae, EOM and lids are normal. Pupils are equal, round, and reactive to light.  Neck: No JVD present. Carotid bruit is not present. No edema present. No thyroid mass and no thyromegaly present.  Cardiovascular: S1 normal and S2 normal.  Exam reveals no gallop.   No murmur heard. Pulses:      Dorsalis pedis pulses are 2+ on the right side, and 2+ on the left side.  Respiratory: No respiratory distress. She has no decreased breath sounds. She has no wheezes. She has no rhonchi. She has no rales.  GI: Soft. Bowel sounds are normal. There is no tenderness.  Musculoskeletal:       Right ankle: She exhibits swelling.       Left ankle: She exhibits swelling.  Lymphadenopathy:    She has no cervical  adenopathy.  Neurological: She is alert.  Able to wiggle toes and since that on touching her feet. Able to lift both arms up off the bed.  Skin: Skin is warm. No rash noted. Nails show no clubbing.  Psychiatric: Her mood appears anxious. She is slowed.      Data Reviewed: Basic Metabolic Panel:  Recent Labs Lab 10/25/16 2053 10/26/16 0437 10/27/16 0354 10/27/16 1051 10/28/16 0549 10/29/16 1040  NA 136 136 134*  --  137 139  K 5.7* 5.4* 5.9* 5.0 4.3 4.7  CL 112* 113* 110  --  111 114*  CO2 17* 16* 17*  --  19* 20*  GLUCOSE 100* 74 187*  --  86 120*  BUN 60* 57* 53*  --  48* 58*  CREATININE 2.84* 2.83* 2.47*  --  1.99* 2.57*  CALCIUM 7.6* 7.6* 8.3*  --  8.5* 8.4*  MG 3.2*  --   --   --   --   --    Liver Function Tests:  Recent Labs Lab 10/25/16 2053 10/27/16 0354  AST 45* 23  ALT 22 19  ALKPHOS 111 125  BILITOT 0.8 0.7  PROT 5.3* 6.4*  ALBUMIN 2.7* 3.2*    Recent Labs Lab 10/25/16 2053  LIPASE 10*   CBC:  Recent Labs Lab 10/25/16 2118 10/26/16 0437 10/27/16 0354 10/28/16 0549  WBC 8.4 9.2 13.8* 11.1*  NEUTROABS 6.4  --   --   --  HGB 8.1* 7.6* 10.7* 10.0*  HCT 25.0* 23.0* 31.0* 29.7*  MCV 91.3 92.9 90.4 91.6  PLT 163 179 255 209   Cardiac Enzymes:  Recent Labs Lab 10/25/16 2053 10/26/16 1610 10/26/16 2150 10/27/16 0354  TROPONINI <0.03 0.03* <0.03 <0.03     Recent Results (from the past 240 hour(s))  MRSA PCR Screening     Status: None   Collection Time: 10/26/16  7:17 AM  Result Value Ref Range Status   MRSA by PCR NEGATIVE NEGATIVE Final    Comment:        The GeneXpert MRSA Assay (FDA approved for NASAL specimens only), is one component of a comprehensive MRSA colonization surveillance program. It is not intended to diagnose MRSA infection nor to guide or monitor treatment for MRSA infections.   Culture, blood (Routine X 2) w Reflex to ID Panel     Status: None (Preliminary result)   Collection Time: 10/26/16  4:10 PM   Result Value Ref Range Status   Specimen Description BLOOD RIGHT ARM  Final   Special Requests   Final    BOTTLES DRAWN AEROBIC AND ANAEROBIC 10MLAERO,8MLANA   Culture NO GROWTH 3 DAYS  Final   Report Status PENDING  Incomplete  Culture, blood (Routine X 2) w Reflex to ID Panel     Status: None (Preliminary result)   Collection Time: 10/26/16  4:10 PM  Result Value Ref Range Status   Specimen Description BLOOD LEFT HAND  Final   Special Requests BOTTLES DRAWN AEROBIC AND ANAEROBIC 6MLAERO,6MLANA  Final   Culture NO GROWTH 3 DAYS  Final   Report Status PENDING  Incomplete     Studies: Ct Head Wo Contrast  Result Date: 10/27/2016 CLINICAL DATA:  Loss of feeling sensation. The patient states that she is dying and asked for the chaplain. She states that she has no feeling throughout her body. She states she can't swallow. Not feeling well at all. EXAM: CT HEAD WITHOUT CONTRAST TECHNIQUE: Contiguous axial images were obtained from the base of the skull through the vertex without intravenous contrast. COMPARISON:  06/30/2013 FINDINGS: Brain: There is central and cortical atrophy. Periventricular white matter changes are consistent with small vessel disease. There is no intra or extra-axial fluid collection or mass lesion. The basilar cisterns and ventricles have a normal appearance. There is no CT evidence for acute infarction or hemorrhage. Vascular: There is atherosclerotic calcification of the carotid siphons. Skull: Normal. Negative for fracture or focal lesion. Sinuses/Orbits: There is soft tissue opacity within the right maxillary sinus consistent with mucous retention cyst, polyp or mucosal thickening. No air-fluid levels are identified. The mastoid air cells are normally aerated. Other: Orbits are unremarkable. IMPRESSION: 1. Atrophy and small vessel disease. 2.  No evidence for acute intracranial abnormality. 3. Probable right maxillary sinus mucous retention cyst or polyp. Electronically  Signed   By: Norva Pavlov M.D.   On: 10/27/2016 18:07    Scheduled Meds: . baclofen  10 mg Oral TID  . busPIRone  15 mg Oral BID  . citalopram  40 mg Oral Daily  . haloperidol  0.5 mg Oral BID  . heparin  5,000 Units Subcutaneous Q8H  . levothyroxine  75 mcg Oral QAC breakfast  . pantoprazole  40 mg Oral BID AC  . pregabalin  25 mg Oral Daily   Continuous Infusions: . sodium chloride 30 mL/hr at 10/29/16 0427    Assessment/Plan:  1. Hypovolemic shock. Patient's blood pressure normal 2. Sensory change throughout her  body. CT scan of the head ordered. No clear etiology 3. Acute kidney injury on chronic kidney disease stage III. This is ATN secondary to hypotension and shock. Renal function labile I will try stopping the fluids to see what happens to her renal function 4. Hyperkalemia resolved  5. Acute hypoxic respiratory failure with Asthma exacerbation. Continue nebulizer treatments. Continue oxygen.  6. Diarrhea. No bowel movement since being here 7. Hypothyroidism unspecified on levothyroxine 8. GERD and recent gastric ulcer on endoscopy on Protonix 9. Restless leg syndrome, discontinue Mirapex 10. Fibromyalgia on Lyrica (decrease dose) and baclofen 11. Depression continue psychiatric medication.seen by pychiatry  Code Status:     Code Status Orders        Start     Ordered   10/26/16 0026  Do not attempt resuscitation (DNR)  Continuous    Question Answer Comment  In the event of cardiac or respiratory ARREST Do not call a "code blue"   In the event of cardiac or respiratory ARREST Do not perform Intubation, CPR, defibrillation or ACLS   In the event of cardiac or respiratory ARREST Use medication by any route, position, wound care, and other measures to relive pain and suffering. May use oxygen, suction and manual treatment of airway obstruction as needed for comfort.      10/26/16 0025    Code Status History    Date Active Date Inactive Code Status Order ID  Comments User Context   08/28/2016  1:22 PM 09/03/2016  3:21 AM Full Code [id number]  Shaune PollackQing Chen, MD Inpatient   09/14/2015 10:36 PM 09/16/2015  9:58 PM Full Code [id number]  Oralia Manisavid Willis, MD Inpatient    Advance Directive Documentation   Flowsheet Row Most Recent Value  Type of Advance Directive  Healthcare Power of Attorney, Living will  Pre-existing out of facility DNR order (yellow form or pink MOST form)  No data  "MOST" Form in Place?  No data     Disposition Plan: Physical therapy recommended rehabilitation  Consultants:  Critical care specialist  Time spent: 25 minutes.   Allena KatzPATEL, [facility]  [employer]

## 2016-10-30 LAB — BASIC METABOLIC PANEL
ANION GAP: 7 (ref 5–15)
BUN: 63 mg/dL — ABNORMAL HIGH (ref 6–20)
CALCIUM: 8.2 mg/dL — AB (ref 8.9–10.3)
CO2: 20 mmol/L — AB (ref 22–32)
Chloride: 111 mmol/L (ref 101–111)
Creatinine, Ser: 2.78 mg/dL — ABNORMAL HIGH (ref 0.44–1.00)
GFR calc Af Amer: 18 mL/min — ABNORMAL LOW (ref >60)
GFR, EST NON AFRICAN AMERICAN: 16 mL/min — AB (ref >60)
GLUCOSE: 90 mg/dL (ref 65–99)
POTASSIUM: 4.3 mmol/L (ref 3.5–5.1)
Sodium: 138 mmol/L (ref 135–145)

## 2016-10-30 LAB — GLUCOSE, CAPILLARY: Glucose-Capillary: 63 mg/dL — ABNORMAL LOW (ref 65–99)

## 2016-10-30 MED ORDER — ARIPIPRAZOLE 2 MG PO TABS
2.0000 mg | ORAL_TABLET | Freq: Every day | ORAL | Status: DC
  Administered 2016-10-30 – 2016-11-06 (×8): 2 mg via ORAL
  Filled 2016-10-30 (×8): qty 1

## 2016-10-30 MED ORDER — SODIUM CHLORIDE 0.9 % IV SOLN
INTRAVENOUS | Status: DC
  Administered 2016-10-30 (×2): via INTRAVENOUS

## 2016-10-30 NOTE — Progress Notes (Signed)
Enteric precautions D/Ced on 12/31. Order cosigned by Dr. Allena KatzPatel. Pt has had no bowel movement since admission.

## 2016-10-30 NOTE — Progress Notes (Signed)
[facility]  ROUNDING NOTE   Subjective:   Misty Medina admitted to [facility] on 10/25/2016 for Dehydration [E86.0] Acute renal failure, unspecified acute renal failure type ([place]) [N17.9] Diarrhea, unspecified type [R19.7]   Patient complains of pain all over her body. She is not moving well. She is not eating. Started on NS at 128m/hr  Admitted with hyperkalemia and creatinine of 2.84  Objective:  Vital signs in last 24 hours:  Temp:  [98.2 F (36.8 C)-99.2 F (37.3 C)] 98.2 F (36.8 C) (01/01 0905) Pulse Rate:  [79-92] 85 (01/01 0905) Resp:  [19-22] 19 (01/01 0905) BP: (122-127)/(60-69) 127/69 (01/01 0905) SpO2:  [91 %-97 %] 94 % (01/01 0905)  Weight change:  Filed Weights   10/25/16 2050 10/26/16 0047 10/26/16 0715  Weight: 70.3 kg (155 lb) 74.4 kg (164 lb 1.6 oz) 77.2 kg (170 lb 3.1 oz)    Intake/Output: I/O last 3 completed shifts: In: 951[P.O.:525; I.V.:397] Out: 0    Intake/Output this shift:  No intake/output data recorded.  Physical Exam: General: Ill appearing  Head: dry oral mucosal membranes  Eyes: Anicteric, PERRL  Neck: Supple, trachea midline  Lungs:  Clear to auscultation  Heart: Regular rate and rhythm  Abdomen:  Soft, nontender, obese  Extremities: no peripheral edema.  Neurologic: Moving her extremities but slowly  Skin: No lesions       Basic Metabolic Panel:  Recent Labs Lab 10/25/16 2053 10/26/16 0437 10/27/16 0354 10/27/16 1051 10/28/16 0549 10/29/16 1040 10/30/16 0617  NA 136 136 134*  --  137 139 138  K 5.7* 5.4* 5.9* 5.0 4.3 4.7 4.3  CL 112* 113* 110  --  111 114* 111  CO2 17* 16* 17*  --  19* 20* 20*  GLUCOSE 100* 74 187*  --  86 120* 90  BUN 60* 57* 53*  --  48* 58* 63*  CREATININE 2.84* 2.83* 2.47*  --  1.99* 2.57* 2.78*  CALCIUM 7.6* 7.6* 8.3*  --  8.5* 8.4* 8.2*  MG 3.2*  --   --   --   --   --   --     Liver Function Tests:  Recent Labs Lab 10/25/16 2053 10/27/16 0354  AST 45* 23  ALT 22 19   ALKPHOS 111 125  BILITOT 0.8 0.7  PROT 5.3* 6.4*  ALBUMIN 2.7* 3.2*    Recent Labs Lab 10/25/16 2053  LIPASE 10*   No results for input(s): AMMONIA in the last 168 hours.  CBC:  Recent Labs Lab 10/25/16 2118 10/26/16 0437 10/27/16 0354 10/28/16 0549  WBC 8.4 9.2 13.8* 11.1*  NEUTROABS 6.4  --   --   --   HGB 8.1* 7.6* 10.7* 10.0*  HCT 25.0* 23.0* 31.0* 29.7*  MCV 91.3 92.9 90.4 91.6  PLT 163 179 255 209    Cardiac Enzymes:  Recent Labs Lab 10/25/16 2053 10/26/16 1610 10/26/16 2150 10/27/16 0354  TROPONINI <0.03 0.03* <0.03 <0.03    BNP: Invalid input(s): POCBNP  CBG:  Recent Labs Lab 10/26/16 0710  GLUCAP 625    Microbiology: Results for orders placed or performed during the hospital encounter of 10/25/16  MRSA PCR Screening     Status: None   Collection Time: 10/26/16  7:17 AM  Result Value Ref Range Status   MRSA by PCR NEGATIVE NEGATIVE Final    Comment:        The GeneXpert MRSA Assay (FDA approved for NASAL specimens only), is one component of a  comprehensive MRSA colonization surveillance program. It is not intended to diagnose MRSA infection nor to guide or monitor treatment for MRSA infections.   Culture, blood (Routine X 2) w Reflex to ID Panel     Status: None (Preliminary result)   Collection Time: 10/26/16  4:10 PM  Result Value Ref Range Status   Specimen Description BLOOD RIGHT ARM  Final   Special Requests   Final    BOTTLES DRAWN AEROBIC AND ANAEROBIC 10MLAERO,8MLANA   Culture NO GROWTH 4 DAYS  Final   Report Status PENDING  Incomplete  Culture, blood (Routine X 2) w Reflex to ID Panel     Status: None (Preliminary result)   Collection Time: 10/26/16  4:10 PM  Result Value Ref Range Status   Specimen Description BLOOD LEFT HAND  Final   Special Requests BOTTLES DRAWN AEROBIC AND ANAEROBIC 6MLAERO,6MLANA  Final   Culture NO GROWTH 4 DAYS  Final   Report Status PENDING  Incomplete    Coagulation Studies: No results  for input(s): LABPROT, INR in the last 72 hours.  Urinalysis: No results for input(s): COLORURINE, LABSPEC, PHURINE, GLUCOSEU, HGBUR, BILIRUBINUR, KETONESUR, PROTEINUR, UROBILINOGEN, NITRITE, LEUKOCYTESUR in the last 72 hours.  Invalid input(s): APPERANCEUR    Imaging: No results found.   Medications:   . sodium chloride 100 mL/hr at 10/30/16 1205   . ARIPiprazole  2 mg Oral Daily  . baclofen  10 mg Oral TID  . busPIRone  15 mg Oral BID  . citalopram  40 mg Oral Daily  . heparin  5,000 Units Subcutaneous Q8H  . levothyroxine  75 mcg Oral QAC breakfast  . mirtazapine  15 mg Oral QHS  . pantoprazole  40 mg Oral BID AC  . pregabalin  25 mg Oral Daily   acetaminophen **OR** acetaminophen, HYDROcodone-acetaminophen, ipratropium-albuterol, ondansetron **OR** ondansetron (ZOFRAN) IV  Assessment/ Plan:  Ms. Misty Medina is a 76 y.o. white female with hypertension, chronic sinusitis, chronic lower back pain, aortic regurgitation, collagenous colitis, hypothyroidism, depression, iron deficiency anemia, and hyperlipidemia admitted to [facility] on 10/25/2016 for Dehydration [E86.0] Acute renal failure, unspecified acute renal failure type ([place]) [N17.9] Diarrhea, unspecified type [R19.7]   1. Acute renal failure with hyperkalemia on chronic kidney disease stage IV: Baseline creatinine of 1.81, eGFR of 27 from 09/28/2016 Acute renal failure from hypotension, poor PO intake and progression to ATN.  - Agree with IV fluids - Renally dose all medications.   2. Hypertension: blood pressure now at goal.  Holding home regimen of carvedilol and chlorthalidone.   3. Anemia with chronic kidney disease. Hemoglobin 10. History of peptic ulcer disease.  - Continue PPI   LOS: [place], [place] 1/1/201812:48 PM

## 2016-10-30 NOTE — Consult Note (Signed)
[facility] Face-to-Face Psychiatry Consult   Reason for Consult: Depression. Referring Physician:  Dr. Leslye Peer Patient Identification: AKIRE RENNERT MRN:  [id number] Principal Diagnosis: Severe recurrent major depressive disorder with psychotic features [facility]) Diagnosis:   Patient Active Problem List   Diagnosis Date Noted  . Acute kidney injury ([place]) [N17.9] 10/26/2016  . Nausea vomiting and diarrhea [R11.2, R19.7] 10/25/2016  . Acute on chronic renal failure ([place]) [N17.9, N18.9] 10/25/2016  . AKI (acute kidney injury) ([place]) [N17.9] 10/25/2016  . Gastric ulcer [K25.9] 09/02/2016  . E. coli UTI [N39.0, B96.20] 09/02/2016  . Aspiration pneumonia ([place]) [J69.0] 09/02/2016  . Acute blood loss anemia [D62] 09/02/2016  . Hypoxia [R09.02] 09/02/2016  . GIB (gastrointestinal bleeding) [K92.2] 08/28/2016  . Fracture of fifth toe, left, closed [S92.502A] 09/16/2015  . Blister of foot without infection [S90.829A] 09/16/2015  . Constipation [K59.00] 09/16/2015  . Falls [phone].[id number]] 09/15/2015  . Frequent falls [R29.6] 09/14/2015  . Foot fracture, left [S92.902A] 09/14/2015  . Hypothyroidism [E03.9] 09/14/2015  . Chronic systolic CHF (congestive heart failure) ([place]) [I50.22] 09/14/2015  . HTN (hypertension) [I10] 09/14/2015  . GERD (gastroesophageal reflux disease) [K21.9] 09/14/2015  . Severe recurrent major depressive disorder with psychotic features ([place]) [F33.3] 09/14/2015    Total Time spent with patient: 15 minutes   12/31 this is a follow up visit. Ms. Spader appears more depressed than yesterday. Her voice is hardly audible, she seems somnolent. She hardly touched her lunch. She still believes that she is dying and will not be here to talk to me tomorrow. Still thinks that her "organs are shutting down". She is ready to "give up". She however denies any suicidal ideation or plans. Her family visited last night but the patient does not feel better. We will continue BuSpar, Celexa and low dose  Haldol. I will add Remeron 15 mg nightly for depression.  Follow-up psychiatric consult for this 76 year old woman on the medical service. I found her today to be essentially the same way that Dr. Mamie Nick had described her over the last couple days. The patient was awake with her eyes open but all she wanted to talk about was how she was dying and all of her organs were shutting down. She denies having any intention or plan of doing anything to harm herself and she seems to be basically compliant. She denied any hallucinations. Her affect however is flat and withdrawn. Very negative.  Identifying data. MINDY GALI is a 76 y.o. female with a history of depression and anxiety.  Chief complaint. "My organs are shutting down."  History of present illness. Information was obtained from the patient and the chart. The patient has a history of depression and anxiety with infrequent panic attacks. She has been treated with a combination of Celexa and BuSpar prescribed by her primary care physician with excellent results. 4 days ago and the patient became physically ill with nausea, diarrhea, and vomiting. She was admitted to the hospital for dehydration. Consult was called because the patient felt that "she was dying". The patient reports that for the past 4 days she has felt that she is about to die and that her "organs are shutting down". She tells me that she is ready to "meet her maker" and wants to go "without pain and suffering". She is asking to see a chaplain again. She tells me that she does not wish her family to visit any more. When I told her that I would be back tomorrow, she assured me that she will  be dead by then. On direct questioning, she denies any symptoms of depression, anxiety, or psychosis, symptoms suggestive of bipolar illness, or drug or alcohol use. She adamantly denies any thoughts, intentions, or plans to hurt herself or others.  Past psychiatric history. Mild depression and anxiety  treated with Celexa and BuSpar by her primary provider. She has never been in a psychiatric hospital. There were no suicide attempts.  Family psychiatric history. Nonreported.  Social history. She lives with her grandson who helps her manage medication.  Risk to Self: Is patient at risk for suicide?: No Risk to Others:   Prior Inpatient Therapy:   Prior Outpatient Therapy:    Past Medical History:  Past Medical History:  Diagnosis Date  . Depression   . Fibromyalgia   . GERD (gastroesophageal reflux disease)   . Heart murmur   . Hypertension   . Hypothyroidism   . Osteoporosis     Past Surgical History:  Procedure Laterality Date  . ABDOMINAL HYSTERECTOMY    . CHOLECYSTECTOMY    . ESOPHAGOGASTRODUODENOSCOPY (EGD) WITH PROPOFOL N/A 08/29/2016   Procedure: ESOPHAGOGASTRODUODENOSCOPY (EGD) WITH PROPOFOL;  Surgeon: Drinda Butts, MD;  Location: [facility] ENDOSCOPY;  Service: Endoscopy;  Laterality: N/A;   Family History:  Family History  Problem Relation Age of Onset  . Hypertension Mother   . Osteoporosis Mother   . Arthritis Father   . Heart attack Father    Social History:  History  Alcohol Use No     History  Drug Use No    Social History   Social History  . Marital status: Divorced    Spouse name: N/A  . Number of children: N/A  . Years of education: N/A   Social History Main Topics  . Smoking status: Never Smoker  . Smokeless tobacco: Never Used  . Alcohol use No  . Drug use: No  . Sexual activity: Not Asked   Other Topics Concern  . None   Social History Narrative  . None   Additional Social History:    Allergies:   Allergies  Allergen Reactions  . Codeine Nausea And Vomiting  . Penicillins Other (See Comments)    Reaction:  Unknown     Labs:  Results for orders placed or performed during the hospital encounter of 10/25/16 (from the past 48 hour(s))  Basic metabolic panel     Status: Abnormal   Collection Time: 10/29/16 10:40 AM  Result  Value Ref Range   Sodium 139 135 - 145 mmol/L   Potassium 4.7 3.5 - 5.1 mmol/L   Chloride 114 (H) 101 - 111 mmol/L   CO2 20 (L) 22 - 32 mmol/L   Glucose, Bld 120 (H) 65 - 99 mg/dL   BUN 58 (H) 6 - 20 mg/dL   Creatinine, Ser 2.57 (H) 0.44 - 1.00 mg/dL   Calcium 8.4 (L) 8.9 - 10.3 mg/dL   GFR calc non Af Amer 17 (L) >60 mL/min   GFR calc Af Amer 20 (L) >60 mL/min    Comment: (NOTE) The eGFR has been calculated using the CKD EPI equation. This calculation has not been validated in all clinical situations. eGFR's persistently <60 mL/min signify possible Chronic Kidney Disease.    Anion gap 5 5 - 15  Basic metabolic panel     Status: Abnormal   Collection Time: 10/30/16  6:17 AM  Result Value Ref Range   Sodium 138 135 - 145 mmol/L   Potassium 4.3 3.5 - 5.1 mmol/L   Chloride  111 101 - 111 mmol/L   CO2 20 (L) 22 - 32 mmol/L   Glucose, Bld 90 65 - 99 mg/dL   BUN 63 (H) 6 - 20 mg/dL   Creatinine, Ser 2.78 (H) 0.44 - 1.00 mg/dL   Calcium 8.2 (L) 8.9 - 10.3 mg/dL   GFR calc non Af Amer 16 (L) >60 mL/min   GFR calc Af Amer 18 (L) >60 mL/min    Comment: (NOTE) The eGFR has been calculated using the CKD EPI equation. This calculation has not been validated in all clinical situations. eGFR's persistently <60 mL/min signify possible Chronic Kidney Disease.    Anion gap 7 5 - 15    Current Facility-Administered Medications  Medication Dose Route Frequency Provider Last Rate Last Dose  . 0.9 %  sodium chloride infusion   Intravenous Continuous Dustin Flock, MD 100 mL/hr at 10/30/16 1205    . acetaminophen (TYLENOL) tablet 650 mg  650 mg Oral Q6H PRN Lance Coon, MD   650 mg at 10/30/16 0402   Or  . acetaminophen (TYLENOL) suppository 650 mg  650 mg Rectal Q6H PRN Lance Coon, MD      . ARIPiprazole (ABILIFY) tablet 2 mg  2 mg Oral Daily Gonzella Lex, MD      . baclofen (LIORESAL) tablet 10 mg  10 mg Oral TID Lance Coon, MD   10 mg at 10/30/16 0905  . busPIRone (BUSPAR) tablet  15 mg  15 mg Oral BID Lance Coon, MD   15 mg at 10/30/16 0906  . citalopram (CELEXA) tablet 40 mg  40 mg Oral Daily Lance Coon, MD   40 mg at 10/30/16 0905  . heparin injection 5,000 Units  5,000 Units Subcutaneous Q8H Lance Coon, MD   5,000 Units at 10/30/16 0631  . HYDROcodone-acetaminophen (NORCO) 10-325 MG per tablet 1 tablet  1 tablet Oral Q6H PRN Lance Coon, MD   1 tablet at 10/30/16 0907  . ipratropium-albuterol (DUONEB) 0.5-2.5 (3) MG/3ML nebulizer solution 3 mL  3 mL Nebulization Q6H PRN Dustin Flock, MD      . levothyroxine (SYNTHROID, LEVOTHROID) tablet 75 mcg  75 mcg Oral QAC breakfast Lance Coon, MD   75 mcg at 10/30/16 0907  . mirtazapine (REMERON) tablet 15 mg  15 mg Oral QHS Clovis Fredrickson, MD   15 mg at 10/29/16 2046  . ondansetron (ZOFRAN) tablet 4 mg  4 mg Oral Q6H PRN Lance Coon, MD       Or  . ondansetron [facility]) injection 4 mg  4 mg Intravenous Q6H PRN Lance Coon, MD   4 mg at 10/26/16 1948  . pantoprazole (PROTONIX) EC tablet 40 mg  40 mg Oral BID AC Lance Coon, MD   40 mg at 10/30/16 0906  . pregabalin (LYRICA) capsule 25 mg  25 mg Oral Daily Loletha Grayer, MD   25 mg at 10/30/16 0905    Musculoskeletal: Strength & Muscle Tone: within normal limits Gait & Station: normal Patient leans: N/A  Psychiatric Specialty Exam: Physical Exam  Nursing note and vitals reviewed.   Review of Systems  Gastrointestinal: Positive for abdominal pain, diarrhea, nausea and vomiting.  Psychiatric/Behavioral: Positive for depression. The patient is nervous/anxious.   All other systems reviewed and are negative.   Blood pressure 127/69, pulse 85, temperature 98.2 F (36.8 C), temperature source Oral, resp. rate 19, height _0  (1.6 m), weight 77.2 kg (170 lb 3.1 oz), SpO2 94 %.Body mass index is 30.15 kg/m.  General Appearance:  Casual  Eye Contact:  Good  Speech:  Clear and Coherent  Volume:  Normal  Mood:  Depressed  Affect:  Blunt  Thought Process:   Goal Directed and Descriptions of Associations: Intact  Orientation:  Full (Time, Place, and Person)  Thought Content:  Delusions and Paranoid Ideation  Suicidal Thoughts:  No  Homicidal Thoughts:  No  Memory:  Immediate;   Fair Recent;   Fair Remote;   Fair  Judgement:  Impaired  Insight:  Shallow  Psychomotor Activity:  Normal  Concentration:  Concentration: Fair and Attention Span: Fair  Recall:  [employer] of Knowledge:  Fair  Language:  Fair  Akathisia:  No  Handed:  Right  AIMS (if indicated):     Assets:  Agricultural consultant Housing Social Support  ADL's:  Intact  Cognition:  WNL  Sleep:        Treatment Plan Summary: Daily contact with patient to assess and evaluate symptoms and progress in treatment and Medication management   PLAN:  1. Please continue Celexa, Buspar for depression and anxiety and Haldol for psychosis.  2. I will add Remeron 15 mg tonight for depression.   3. Dr. Weber Cooks will follow up tomorrow.  I am going to discontinue the Haldol and switch it to Abilify for better treatment efficacy for depression and psychosis. I am not going to change her other medicines given that her creatinine is actually getting worse. Might over the next couple days consider increasing the Remeron. No need for commitment or sitter at this point as she does not appear to be acting out or have any intention to act out to harm herself.  Disposition: No evidence of imminent risk to self or others at present.   Patient does not meet criteria for psychiatric inpatient admission. Supportive therapy provided about ongoing stressors. Discussed crisis plan, support from social network, calling 911, coming to the Emergency Department, and calling Suicide Hotline.  Alethia Berthold, MD 10/30/2016 1:07 PM

## 2016-10-30 NOTE — Progress Notes (Signed)
Patient ID: Misty PartridgeBrenda G Medina, female   DOB: 03/29/1941, 76 y.o.   MRN: [id number]  [employer] PROGRESS NOTE  Misty PartridgeBrenda G Medina JXB:[id number] DOB: 09/28/1941 DOA: 10/25/2016 PCP: Luna FuseEJAN-SIE, SHEIKH AHMED, MD  HPI/Subjective:  Pt continue to state she is ready to "die" she reports that she has gone through enough She does not feel suicidal   Objective: Vitals:   10/30/16 0905 10/30/16 1339  BP: 127/69 123/65  Pulse: 85 84  Resp: 19 16  Temp: 98.2 F (36.8 C) 99.1 F (37.3 C)    Filed Weights   10/25/16 2050 10/26/16 0047 10/26/16 0715  Weight: 155 lb (70.3 kg) 164 lb 1.6 oz (74.4 kg) 170 lb 3.1 oz (77.2 kg)    ROS: Review of Systems  Constitutional: Negative for chills and fever.  Eyes: Negative for blurred vision.  Respiratory: Positive for shortness of breath. Negative for cough.   Cardiovascular: Negative for chest pain.  Gastrointestinal: Negative for abdominal pain, constipation, diarrhea, nausea and vomiting.  Genitourinary: Negative for dysuria.  Musculoskeletal: Negative for joint pain.  Neurological: Positive for weakness. Negative for dizziness, sensory change and headaches.   Exam: Physical Exam  HENT:  Nose: No mucosal edema.  Mouth/Throat: No oropharyngeal exudate or posterior oropharyngeal edema.  Eyes: Conjunctivae, EOM and lids are normal. Pupils are equal, round, and reactive to light.  Neck: No JVD present. Carotid bruit is not present. No edema present. No thyroid mass and no thyromegaly present.  Cardiovascular: S1 normal and S2 normal.  Exam reveals no gallop.   No murmur heard. Pulses:      Dorsalis pedis pulses are 2+ on the right side, and 2+ on the left side.  Respiratory: No respiratory distress. She has no decreased breath sounds. She has no wheezes. She has no rhonchi. She has no rales.  GI: Soft. Bowel sounds are normal. There is no tenderness.  Musculoskeletal:       Right ankle: She exhibits swelling.       Left ankle: She exhibits  swelling.  Lymphadenopathy:    She has no cervical adenopathy.  Neurological: She is alert.  Able to wiggle toes and since that on touching her feet. Able to lift both arms up off the bed.  Skin: Skin is warm. No rash noted. Nails show no clubbing.  Psychiatric: Her mood appears anxious. She is slowed.      Data Reviewed: Basic Metabolic Panel:  Recent Labs Lab 10/25/16 2053 10/26/16 0437 10/27/16 0354 10/27/16 1051 10/28/16 0549 10/29/16 1040 10/30/16 0617  NA 136 136 134*  --  137 139 138  K 5.7* 5.4* 5.9* 5.0 4.3 4.7 4.3  CL 112* 113* 110  --  111 114* 111  CO2 17* 16* 17*  --  19* 20* 20*  GLUCOSE 100* 74 187*  --  86 120* 90  BUN 60* 57* 53*  --  48* 58* 63*  CREATININE 2.84* 2.83* 2.47*  --  1.99* 2.57* 2.78*  CALCIUM 7.6* 7.6* 8.3*  --  8.5* 8.4* 8.2*  MG 3.2*  --   --   --   --   --   --    Liver Function Tests:  Recent Labs Lab 10/25/16 2053 10/27/16 0354  AST 45* 23  ALT 22 19  ALKPHOS 111 125  BILITOT 0.8 0.7  PROT 5.3* 6.4*  ALBUMIN 2.7* 3.2*    Recent Labs Lab 10/25/16 2053  LIPASE 10*   CBC:  Recent Labs Lab 10/25/16 2118 10/26/16 0437 10/27/16  0354 10/28/16 0549  WBC 8.4 9.2 13.8* 11.1*  NEUTROABS 6.4  --   --   --   HGB 8.1* 7.6* 10.7* 10.0*  HCT 25.0* 23.0* 31.0* 29.7*  MCV 91.3 92.9 90.4 91.6  PLT 163 179 255 209   Cardiac Enzymes:  Recent Labs Lab 10/25/16 2053 10/26/16 1610 10/26/16 2150 10/27/16 0354  TROPONINI <0.03 0.03* <0.03 <0.03     Recent Results (from the past 240 hour(s))  MRSA PCR Screening     Status: None   Collection Time: 10/26/16  7:17 AM  Result Value Ref Range Status   MRSA by PCR NEGATIVE NEGATIVE Final    Comment:        The GeneXpert MRSA Assay (FDA approved for NASAL specimens only), is one component of a comprehensive MRSA colonization surveillance program. It is not intended to diagnose MRSA infection nor to guide or monitor treatment for MRSA infections.   Culture, blood  (Routine X 2) w Reflex to ID Panel     Status: None (Preliminary result)   Collection Time: 10/26/16  4:10 PM  Result Value Ref Range Status   Specimen Description BLOOD RIGHT ARM  Final   Special Requests   Final    BOTTLES DRAWN AEROBIC AND ANAEROBIC 10MLAERO,8MLANA   Culture NO GROWTH 4 DAYS  Final   Report Status PENDING  Incomplete  Culture, blood (Routine X 2) w Reflex to ID Panel     Status: None (Preliminary result)   Collection Time: 10/26/16  4:10 PM  Result Value Ref Range Status   Specimen Description BLOOD LEFT HAND  Final   Special Requests BOTTLES DRAWN AEROBIC AND ANAEROBIC 6MLAERO,6MLANA  Final   Culture NO GROWTH 4 DAYS  Final   Report Status PENDING  Incomplete     Studies: No results found.  Scheduled Meds: . ARIPiprazole  2 mg Oral Daily  . baclofen  10 mg Oral TID  . busPIRone  15 mg Oral BID  . citalopram  40 mg Oral Daily  . heparin  5,000 Units Subcutaneous Q8H  . levothyroxine  75 mcg Oral QAC breakfast  . mirtazapine  15 mg Oral QHS  . pantoprazole  40 mg Oral BID AC  . pregabalin  25 mg Oral Daily   Continuous Infusions: . sodium chloride 100 mL/hr at 10/30/16 1205    Assessment/Plan:  1. Hypovolemic shock. Patient's blood pressure normal 2. Acute kidney injury on chronic kidney disease stage III. Renal function worsened again today I will ask nephrology to see  3. Hyperkalemia resolved  4. Acute hypoxic respiratory failure with Asthma exacerbation. Continue nebulizer treatments. Continue oxygen as needed 5. Diarrhea. Resolved 6. Hypothyroidism unspecified on levothyroxine 7. GERD and recent gastric ulcer on endoscopy on Protonix 8. Restless leg syndrome, discontinue Mirapex 9. Fibromyalgia on Lyrica (decrease dose) and baclofen 10. Depression continue psychiatric medication.seen by pychiatry  Code Status:     Code Status Orders        Start     Ordered   10/26/16 0026  Do not attempt resuscitation (DNR)  Continuous    Question  Answer Comment  In the event of cardiac or respiratory ARREST Do not call a "code blue"   In the event of cardiac or respiratory ARREST Do not perform Intubation, CPR, defibrillation or ACLS   In the event of cardiac or respiratory ARREST Use medication by any route, position, wound care, and other measures to relive pain and suffering. May use oxygen, suction and manual treatment  of airway obstruction as needed for comfort.      10/26/16 0025    Code Status History    Date Active Date Inactive Code Status Order ID Comments User Context   08/28/2016  1:22 PM 09/03/2016  3:21 AM Full Code [id number]  Shaune Pollack, MD Inpatient   09/14/2015 10:36 PM 09/16/2015  9:58 PM Full Code [id number]  Oralia Manis, MD Inpatient    Advance Directive Documentation   Flowsheet Row Most Recent Value  Type of Advance Directive  Healthcare Power of Attorney, Living will  Pre-existing out of facility DNR order (yellow form or pink MOST form)  No data  "MOST" Form in Place?  No data     Disposition Plan: Physical therapy recommended rehabilitation  Consultants:  Critical care specialist  Time spent: 25 minutes.   Allena Katz [facility]  [employer]

## 2016-10-31 LAB — BASIC METABOLIC PANEL
Anion gap: 8 (ref 5–15)
BUN: 65 mg/dL — AB (ref 6–20)
CO2: 16 mmol/L — ABNORMAL LOW (ref 22–32)
Calcium: 8 mg/dL — ABNORMAL LOW (ref 8.9–10.3)
Chloride: 116 mmol/L — ABNORMAL HIGH (ref 101–111)
Creatinine, Ser: 2.82 mg/dL — ABNORMAL HIGH (ref 0.44–1.00)
GFR calc Af Amer: 18 mL/min — ABNORMAL LOW (ref >60)
GFR, EST NON AFRICAN AMERICAN: 15 mL/min — AB (ref >60)
GLUCOSE: 71 mg/dL (ref 65–99)
POTASSIUM: 4.8 mmol/L (ref 3.5–5.1)
Sodium: 140 mmol/L (ref 135–145)

## 2016-10-31 LAB — CULTURE, BLOOD (ROUTINE X 2)
Culture: NO GROWTH
Culture: NO GROWTH

## 2016-10-31 MED ORDER — MORPHINE SULFATE (PF) 4 MG/ML IV SOLN
2.0000 mg | INTRAVENOUS | Status: DC | PRN
  Administered 2016-11-02: 2 mg via INTRAVENOUS
  Filled 2016-10-31: qty 1

## 2016-10-31 MED ORDER — SODIUM BICARBONATE 8.4 % IV SOLN
INTRAVENOUS | Status: DC
  Administered 2016-10-31 (×2): via INTRAVENOUS
  Filled 2016-10-31 (×4): qty 150

## 2016-10-31 MED ORDER — DOCUSATE SODIUM 100 MG PO CAPS
200.0000 mg | ORAL_CAPSULE | Freq: Two times a day (BID) | ORAL | Status: DC
  Administered 2016-10-31 – 2016-11-05 (×10): 200 mg via ORAL
  Filled 2016-10-31 (×11): qty 2

## 2016-10-31 MED ORDER — SODIUM CHLORIDE 0.9 % IV BOLUS (SEPSIS)
1000.0000 mL | INTRAVENOUS | Status: DC | PRN
  Administered 2016-10-31: 1000 mL via INTRAVENOUS
  Filled 2016-10-31: qty 1000

## 2016-10-31 MED ORDER — SODIUM CHLORIDE 0.9 % IV BOLUS (SEPSIS)
500.0000 mL | Freq: Once | INTRAVENOUS | Status: AC
  Administered 2016-10-31: 500 mL via INTRAVENOUS

## 2016-10-31 MED ORDER — CITALOPRAM HYDROBROMIDE 20 MG PO TABS
30.0000 mg | ORAL_TABLET | Freq: Every day | ORAL | Status: DC
  Administered 2016-11-01 – 2016-11-06 (×6): 30 mg via ORAL
  Filled 2016-10-31 (×6): qty 1

## 2016-10-31 MED ORDER — LORAZEPAM 2 MG/ML IJ SOLN
1.0000 mg | Freq: Four times a day (QID) | INTRAMUSCULAR | Status: DC | PRN
Start: 2016-10-31 — End: 2016-11-06
  Administered 2016-11-02: 1 mg via INTRAVENOUS
  Filled 2016-10-31: qty 1

## 2016-10-31 NOTE — Progress Notes (Signed)
Speech Language Pathology Treatment: Dysphagia  Patient Details Name: TONITA BILLS MRN: [id number] DOB: 10/14/1941 Today's Date: 10/31/2016 Time: [id number] SLP Time Calculation (min) (ACUTE ONLY): 35 min  Assessment / Plan / Recommendation Clinical Impression  Met w/ pt and offered sips of liquid to "wet her mouth", and when offered juices, she requested apple juice. Instructed pt on the need to raise her up and reposition her upright in bed for safer swallowing (and easier breathing). Noted pt's breathing was c/b min wheezing quality; speech slightly mumbled w/ low volume. Pt is edentulous also. Pt was given trials of thin liquids via Pinched Straw and cup as she did not attempt to sip from the straw; she also presented w/ decreased effort exhibiting an open-mouth posture frequently. She swallowed and cleared trials of the thin liquids as well as the puree fed to her w/ no overt s/s of aspiration noted. She cleared orally post trials of puree. Pt only accepted a few trials of each frequently stating she was "ready to go", "let me pass", "want to go to heaven now".  Pt appears at min increased risk for aspiration secondary to her declined cognitive status and engagement as well as increased effortful respiratory presentation w/ any exertion. She is only accepting few bites and sips at a time and would benefit from f/u by Dietician for supplements. She requires 100% supervision and feeding support w/ encouragement at meals. Pt would benefit from continued diet modification to a Dysphagia level 1 w/ thin liquids w/ strict aspiration precautions; meds in Puree; dietary supplements per Dietician. ST will continue to follow w/ pt's status while admitted.    HPI HPI: Vanda Waskey  is a 76 y.o. female who presents with Nausea vomiting and diarrhea for the past several days. Patient denies any recent antibiotic use, she denies any fevers/chills, abdominal pain, or other infectious symptoms. On lab evaluation  here she was found to have acute on chronic renal injury in the setting of profound dehydration. Hospitalists were called for admission. MD reported that pt stated she could not swallow and that she feels like she is dying. She is being followed by Psychiatry.       SLP Plan  Continue with current plan of care     Recommendations  Diet recommendations: Dysphagia 1 (puree);Thin liquid Liquids provided via: Cup;Teaspoon (pinched straw) Medication Administration: Crushed with puree Supervision: Staff to assist with self feeding;Full supervision/cueing for compensatory strategies;Trained caregiver to feed patient Compensations: Minimize environmental distractions;Slow rate;Small sips/bites;Lingual sweep for clearance of pocketing;Multiple dry swallows after each bite/sip;Follow solids with liquid Postural Changes and/or Swallow Maneuvers: Seated upright 90 degrees;Upright 30-60 min after meal                General recommendations:  (Dietician consult and f/u) Oral Care Recommendations: Oral care BID;Staff/trained caregiver to provide oral care Follow up Recommendations:  (TBD) Plan: Continue with current plan of care       GO               Orinda Kenner, [place], CCC-SLP Watson,Katherine 10/31/2016, 1:39 PM

## 2016-10-31 NOTE — Progress Notes (Signed)
PT Cancellation Note  Patient Details Name: Misty PartridgeBrenda G Medina MRN: [id number] DOB: 11/14/1940   Cancelled Treatment:    Reason Eval/Treat Not Completed: Other (comment). Treatment attempted, however pt refuses all mobility and there-ex attempts. Pt lying crooked in bed, however refuses attempts for repositioning. Unable to participate this date. Pt just states, she is dying and wants to be left alone. Will re-attempt another time. RN updated.   Yarelin Reichardt 10/31/2016, 11:36 AM Elizabeth PalauStephanie Deaunna Olarte, PT, DPT [phone]

## 2016-10-31 NOTE — Progress Notes (Signed)
Patient ID: Misty Medina, female   DOB: 07/20/1941, 76 y.o.   MRN: [id number]  [employer] PROGRESS NOTE  Misty Medina WUJ:[id number] DOB: 02/03/1941 DOA: 10/25/2016 PCP: Luna FuseEJAN-SIE, SHEIKH AHMED, MD  HPI/Subjective:  Complains of generalized pain.    Objective: Vitals:   10/31/16 1052 10/31/16 1345  BP: 104/60 (!) 106/59  Pulse: 91 80  Resp: 20   Temp: 97.9 F (36.6 C) 99.2 F (37.3 C)    Filed Weights   10/25/16 2050 10/26/16 0047 10/26/16 0715  Weight: 155 lb (70.3 kg) 164 lb 1.6 oz (74.4 kg) 170 lb 3.1 oz (77.2 kg)    ROS: Review of Systems  Constitutional: Negative for chills and fever.  Eyes: Negative for blurred vision.  Respiratory: Positive for shortness of breath. Negative for cough.   Cardiovascular: Negative for chest pain.  Gastrointestinal: Negative for abdominal pain, constipation, diarrhea, nausea and vomiting.  Genitourinary: Negative for dysuria.  Musculoskeletal: Negative for joint pain.  Neurological: Positive for weakness. Negative for dizziness, sensory change and headaches.   Exam: Physical Exam  HENT:  Nose: No mucosal edema.  Mouth/Throat: No oropharyngeal exudate or posterior oropharyngeal edema.  Eyes: Conjunctivae, EOM and lids are normal. Pupils are equal, round, and reactive to light.  Neck: No JVD present. Carotid bruit is not present. No edema present. No thyroid mass and no thyromegaly present.  Cardiovascular: S1 normal and S2 normal.  Exam reveals no gallop.   No murmur heard. Pulses:      Dorsalis pedis pulses are 2+ on the right side, and 2+ on the left side.  Respiratory: No respiratory distress. She has no decreased breath sounds. She has no wheezes. She has no rhonchi. She has no rales.  GI: Soft. Bowel sounds are normal. There is no tenderness.  Musculoskeletal:       Right ankle: She exhibits swelling.       Left ankle: She exhibits swelling.  Lymphadenopathy:    She has no cervical adenopathy.  Neurological: She  is alert.  Able to wiggle toes and since that on touching her feet. Able to lift both arms up off the bed.  Skin: Skin is warm. No rash noted. Nails show no clubbing.  Psychiatric: Her mood appears anxious. She is slowed.      Data Reviewed: Basic Metabolic Panel:  Recent Labs Lab 10/25/16 2053  10/27/16 0354 10/27/16 1051 10/28/16 0549 10/29/16 1040 10/30/16 0617 10/31/16 0432  NA 136  < > 134*  --  137 139 138 140  K 5.7*  < > 5.9* 5.0 4.3 4.7 4.3 4.8  CL 112*  < > 110  --  111 114* 111 116*  CO2 17*  < > 17*  --  19* 20* 20* 16*  GLUCOSE 100*  < > 187*  --  86 120* 90 71  BUN 60*  < > 53*  --  48* 58* 63* 65*  CREATININE 2.84*  < > 2.47*  --  1.99* 2.57* 2.78* 2.82*  CALCIUM 7.6*  < > 8.3*  --  8.5* 8.4* 8.2* 8.0*  MG 3.2*  --   --   --   --   --   --   --   < > = values in this interval not displayed. Liver Function Tests:  Recent Labs Lab 10/25/16 2053 10/27/16 0354  AST 45* 23  ALT 22 19  ALKPHOS 111 125  BILITOT 0.8 0.7  PROT 5.3* 6.4*  ALBUMIN 2.7* 3.2*  Recent Labs Lab 10/25/16 2053  LIPASE 10*   CBC:  Recent Labs Lab 10/25/16 2118 10/26/16 0437 10/27/16 0354 10/28/16 0549  WBC 8.4 9.2 13.8* 11.1*  NEUTROABS 6.4  --   --   --   HGB 8.1* 7.6* 10.7* 10.0*  HCT 25.0* 23.0* 31.0* 29.7*  MCV 91.3 92.9 90.4 91.6  PLT 163 179 255 209   Cardiac Enzymes:  Recent Labs Lab 10/25/16 2053 10/26/16 1610 10/26/16 2150 10/27/16 0354  TROPONINI <0.03 0.03* <0.03 <0.03     Recent Results (from the past 240 hour(s))  MRSA PCR Screening     Status: None   Collection Time: 10/26/16  7:17 AM  Result Value Ref Range Status   MRSA by PCR NEGATIVE NEGATIVE Final    Comment:        The GeneXpert MRSA Assay (FDA approved for NASAL specimens only), is one component of a comprehensive MRSA colonization surveillance program. It is not intended to diagnose MRSA infection nor to guide or monitor treatment for MRSA infections.   Culture, blood  (Routine X 2) w Reflex to ID Panel     Status: None   Collection Time: 10/26/16  4:10 PM  Result Value Ref Range Status   Specimen Description BLOOD RIGHT ARM  Final   Special Requests   Final    BOTTLES DRAWN AEROBIC AND ANAEROBIC 10MLAERO,8MLANA   Culture NO GROWTH 5 DAYS  Final   Report Status 10/31/2016 FINAL  Final  Culture, blood (Routine X 2) w Reflex to ID Panel     Status: None   Collection Time: 10/26/16  4:10 PM  Result Value Ref Range Status   Specimen Description BLOOD LEFT HAND  Final   Special Requests BOTTLES DRAWN AEROBIC AND ANAEROBIC 6MLAERO,6MLANA  Final   Culture NO GROWTH 5 DAYS  Final   Report Status 10/31/2016 FINAL  Final     Studies: No results found.  Scheduled Meds: . ARIPiprazole  2 mg Oral Daily  . baclofen  10 mg Oral TID  . busPIRone  15 mg Oral BID  . citalopram  40 mg Oral Daily  . docusate sodium  200 mg Oral BID  . heparin  5,000 Units Subcutaneous Q8H  . levothyroxine  75 mcg Oral QAC breakfast  . mirtazapine  15 mg Oral QHS  . pantoprazole  40 mg Oral BID AC  . pregabalin  25 mg Oral Daily   Continuous Infusions: .  sodium bicarbonate  infusion 1000 mL 75 mL/hr at 10/31/16 0830    Assessment/Plan:  1. Hypovolemic shock. Patient's blood pressure normal 2. Acute kidney injury on chronic kidney disease stage III. Seen by nephrology and renal function is worse today. Supportive care  3. Hyperkalemia resolved  4. Acute hypoxic respiratory failure with Asthma exacerbation. Continue nebulizer treatments. Continue oxygen as needed 5. Diarrhea. Resolved now constipated 6. Hypothyroidism unspecified on levothyroxine 7. GERD and recent gastric ulcer on endoscopy on Protonix 8. Restless leg syndrome, discontinue Mirapex 9. Fibromyalgia on Lyrica (decrease dose) and baclofen 10. Depression continue psychiatric medication.seen by pychiatry  Failure to improve. Patient states that she is very uncomfortable and states that she is ready to meet  "Jesus"  I will ask pallative care to see.   Code Status:     Code Status Orders        Start     Ordered   10/26/16 0026  Do not attempt resuscitation (DNR)  Continuous    Question Answer Comment  In the  event of cardiac or respiratory ARREST Do not call a "code blue"   In the event of cardiac or respiratory ARREST Do not perform Intubation, CPR, defibrillation or ACLS   In the event of cardiac or respiratory ARREST Use medication by any route, position, wound care, and other measures to relive pain and suffering. May use oxygen, suction and manual treatment of airway obstruction as needed for comfort.      10/26/16 0025    Code Status History    Date Active Date Inactive Code Status Order ID Comments User Context   08/28/2016  1:22 PM 09/03/2016  3:21 AM Full Code [id number]  Shaune Pollack, MD Inpatient   09/14/2015 10:36 PM 09/16/2015  9:58 PM Full Code [id number]  Oralia Manis, MD Inpatient    Advance Directive Documentation   Flowsheet Row Most Recent Value  Type of Advance Directive  Healthcare Power of Attorney, Living will  Pre-existing out of facility DNR order (yellow form or pink MOST form)  No data  "MOST" Form in Place?  No data     Disposition Plan: Physical therapy recommended rehabilitation  Consultants:  Critical care specialist  Time spent: 25 minutes.   Allena Katz [facility]  [facility]

## 2016-10-31 NOTE — Consult Note (Signed)
[facility] Face-to-Face Psychiatry Consult   Reason for Consult: Depression. Referring Physician:  Dr. Leslye Peer Patient Identification: Misty Medina MRN:  [id number] Principal Diagnosis: Severe recurrent major depressive disorder with psychotic features [facility]) Diagnosis:   Patient Active Problem List   Diagnosis Date Noted  . Acute kidney injury ([place]) [N17.9] 10/26/2016  . Nausea vomiting and diarrhea [R11.2, R19.7] 10/25/2016  . Acute on chronic renal failure ([place]) [N17.9, N18.9] 10/25/2016  . AKI (acute kidney injury) ([place]) [N17.9] 10/25/2016  . Gastric ulcer [K25.9] 09/02/2016  . E. coli UTI [N39.0, B96.20] 09/02/2016  . Aspiration pneumonia ([place]) [J69.0] 09/02/2016  . Acute blood loss anemia [D62] 09/02/2016  . Hypoxia [R09.02] 09/02/2016  . GIB (gastrointestinal bleeding) [K92.2] 08/28/2016  . Fracture of fifth toe, left, closed [S92.502A] 09/16/2015  . Blister of foot without infection [S90.829A] 09/16/2015  . Constipation [K59.00] 09/16/2015  . Falls [phone] 09/15/2015  . Frequent falls [R29.6] 09/14/2015  . Foot fracture, left [S92.902A] 09/14/2015  . Hypothyroidism [E03.9] 09/14/2015  . Chronic systolic CHF (congestive heart failure) ([place]) [I50.22] 09/14/2015  . HTN (hypertension) [I10] 09/14/2015  . GERD (gastroesophageal reflux disease) [K21.9] 09/14/2015  . Severe recurrent major depressive disorder with psychotic features ([place]) [F33.3] 09/14/2015    Total Time spent with patient: 15 minutes   12/31 this is a follow up visit. Misty Medina appears more depressed than yesterday. Her voice is hardly audible, she seems somnolent. She hardly touched her lunch. She still believes that she is dying and will not be here to talk to me tomorrow. Still thinks that her "organs are shutting down". She is ready to "give up". She however denies any suicidal ideation or plans. Her family visited last night but the patient does not feel better. We will continue BuSpar, Celexa and low dose  Haldol. I will add Remeron 15 mg nightly for depression.  Follow-up psychiatric consult for this 76 year old woman on the medical service. I found her today to be essentially the same way that Dr. Mamie Nick had described her over the last couple days. The patient was awake with her eyes open but all she wanted to talk about was how she was dying and all of her organs were shutting down. She denies having any intention or plan of doing anything to harm herself and she seems to be basically compliant. She denied any hallucinations. Her affect however is flat and withdrawn. Very negative.  Followed up for January 2. Patient seen chart reviewed. Patient looks even worse than yesterday. She had her eyes open and made some effort to speak but could barely get any words out. Looks very dehydrated. Didn't make any other lucid comments that I was able to understand. Doesn't appear to be making any action to try to harm herself.  Identifying data. Misty Medina is a 76 y.o. female with a history of depression and anxiety.  Chief complaint. "My organs are shutting down."  History of present illness. Information was obtained from the patient and the chart. The patient has a history of depression and anxiety with infrequent panic attacks. She has been treated with a combination of Celexa and BuSpar prescribed by her primary care physician with excellent results. 4 days ago and the patient became physically ill with nausea, diarrhea, and vomiting. She was admitted to the hospital for dehydration. Consult was called because the patient felt that "she was dying". The patient reports that for the past 4 days she has felt that she is about to die and that her "  organs are shutting down". She tells me that she is ready to "meet her maker" and wants to go "without pain and suffering". She is asking to see a chaplain again. She tells me that she does not wish her family to visit any more. When I told her that I would be back  tomorrow, she assured me that she will be dead by then. On direct questioning, she denies any symptoms of depression, anxiety, or psychosis, symptoms suggestive of bipolar illness, or drug or alcohol use. She adamantly denies any thoughts, intentions, or plans to hurt herself or others.  Past psychiatric history. Mild depression and anxiety treated with Celexa and BuSpar by her primary provider. She has never been in a psychiatric hospital. There were no suicide attempts.  Family psychiatric history. Nonreported.  Social history. She lives with her grandson who helps her manage medication.  Risk to Self: Is patient at risk for suicide?: No Risk to Others:   Prior Inpatient Therapy:   Prior Outpatient Therapy:    Past Medical History:  Past Medical History:  Diagnosis Date  . Depression   . Fibromyalgia   . GERD (gastroesophageal reflux disease)   . Heart murmur   . Hypertension   . Hypothyroidism   . Osteoporosis     Past Surgical History:  Procedure Laterality Date  . ABDOMINAL HYSTERECTOMY    . CHOLECYSTECTOMY    . ESOPHAGOGASTRODUODENOSCOPY (EGD) WITH PROPOFOL N/A 08/29/2016   Procedure: ESOPHAGOGASTRODUODENOSCOPY (EGD) WITH PROPOFOL;  Surgeon: Drinda Butts, MD;  Location: [facility] ENDOSCOPY;  Service: Endoscopy;  Laterality: N/A;   Family History:  Family History  Problem Relation Age of Onset  . Hypertension Mother   . Osteoporosis Mother   . Arthritis Father   . Heart attack Father    Social History:  History  Alcohol Use No     History  Drug Use No    Social History   Social History  . Marital status: Divorced    Spouse name: N/A  . Number of children: N/A  . Years of education: N/A   Social History Main Topics  . Smoking status: Never Smoker  . Smokeless tobacco: Never Used  . Alcohol use No  . Drug use: No  . Sexual activity: Not Asked   Other Topics Concern  . None   Social History Narrative  . None   Additional Social History:    Allergies:    Allergies  Allergen Reactions  . Codeine Nausea And Vomiting  . Penicillins Other (See Comments)    Reaction:  Unknown     Labs:  Results for orders placed or performed during the hospital encounter of 10/25/16 (from the past 48 hour(s))  Basic metabolic panel     Status: Abnormal   Collection Time: 10/30/16  6:17 AM  Result Value Ref Range   Sodium 138 135 - 145 mmol/L   Potassium 4.3 3.5 - 5.1 mmol/L   Chloride 111 101 - 111 mmol/L   CO2 20 (L) 22 - 32 mmol/L   Glucose, Bld 90 65 - 99 mg/dL   BUN 63 (H) 6 - 20 mg/dL   Creatinine, Ser 2.78 (H) 0.44 - 1.00 mg/dL   Calcium 8.2 (L) 8.9 - 10.3 mg/dL   GFR calc non Af Amer 16 (L) >60 mL/min   GFR calc Af Amer 18 (L) >60 mL/min    Comment: (NOTE) The eGFR has been calculated using the CKD EPI equation. This calculation has not been validated in all clinical situations.  eGFR's persistently <60 mL/min signify possible Chronic Kidney Disease.    Anion gap 7 5 - 15  Basic metabolic panel     Status: Abnormal   Collection Time: 10/31/16  4:32 AM  Result Value Ref Range   Sodium 140 135 - 145 mmol/L   Potassium 4.8 3.5 - 5.1 mmol/L   Chloride 116 (H) 101 - 111 mmol/L   CO2 16 (L) 22 - 32 mmol/L   Glucose, Bld 71 65 - 99 mg/dL   BUN 65 (H) 6 - 20 mg/dL   Creatinine, Ser 2.82 (H) 0.44 - 1.00 mg/dL   Calcium 8.0 (L) 8.9 - 10.3 mg/dL   GFR calc non Af Amer 15 (L) >60 mL/min   GFR calc Af Amer 18 (L) >60 mL/min    Comment: (NOTE) The eGFR has been calculated using the CKD EPI equation. This calculation has not been validated in all clinical situations. eGFR's persistently <60 mL/min signify possible Chronic Kidney Disease.    Anion gap 8 5 - 15    Current Facility-Administered Medications  Medication Dose Route Frequency Provider Last Rate Last Dose  . acetaminophen (TYLENOL) tablet 650 mg  650 mg Oral Q6H PRN Lance Coon, MD   650 mg at 10/30/16 0402   Or  . acetaminophen (TYLENOL) suppository 650 mg  650 mg Rectal Q6H PRN  Lance Coon, MD      . ARIPiprazole (ABILIFY) tablet 2 mg  2 mg Oral Daily Gonzella Lex, MD   2 mg at 10/31/16 1055  . baclofen (LIORESAL) tablet 10 mg  10 mg Oral TID Lance Coon, MD   10 mg at 10/31/16 1055  . busPIRone (BUSPAR) tablet 15 mg  15 mg Oral BID Lance Coon, MD   15 mg at 10/31/16 1055  . [START ON 11/01/2016] citalopram (CELEXA) tablet 30 mg  30 mg Oral Daily Gonzella Lex, MD      . docusate sodium (COLACE) capsule 200 mg  200 mg Oral BID Dustin Flock, MD      . heparin injection 5,000 Units  5,000 Units Subcutaneous Q8H Lance Coon, MD   5,000 Units at 10/31/16 [phone]  . HYDROcodone-acetaminophen (NORCO) 10-325 MG per tablet 1 tablet  1 tablet Oral Q6H PRN Lance Coon, MD   1 tablet at 10/31/16 1322  . ipratropium-albuterol (DUONEB) 0.5-2.5 (3) MG/3ML nebulizer solution 3 mL  3 mL Nebulization Q6H PRN Dustin Flock, MD      . levothyroxine (SYNTHROID, LEVOTHROID) tablet 75 mcg  75 mcg Oral QAC breakfast Lance Coon, MD   75 mcg at 10/31/16 1055  . LORazepam (ATIVAN) injection 1 mg  1 mg Intravenous Q6H PRN Dustin Flock, MD      . mirtazapine (REMERON) tablet 15 mg  15 mg Oral QHS Clovis Fredrickson, MD   15 mg at 10/30/16 2117  . morphine 4 MG/ML injection 2 mg  2 mg Intravenous Q3H PRN Dustin Flock, MD      . ondansetron (ZOFRAN) tablet 4 mg  4 mg Oral Q6H PRN Lance Coon, MD       Or  . ondansetron [facility]) injection 4 mg  4 mg Intravenous Q6H PRN Lance Coon, MD   4 mg at 10/26/16 1948  . pantoprazole (PROTONIX) EC tablet 40 mg  40 mg Oral BID AC Lance Coon, MD   40 mg at 10/31/16 1055  . pregabalin (LYRICA) capsule 25 mg  25 mg Oral Daily Loletha Grayer, MD   25 mg at 10/31/16 1055  .  sodium bicarbonate 150 mEq in dextrose 5 % 1,000 mL infusion   Intravenous Continuous Lavonia Dana, MD 75 mL/hr at 10/31/16 0830      Musculoskeletal: Strength & Muscle Tone: within normal limits Gait & Station: normal Patient leans: N/A  Psychiatric Specialty  Exam: Physical Exam  Nursing note and vitals reviewed.   Review of Systems  Gastrointestinal: Positive for abdominal pain, diarrhea, nausea and vomiting.  Psychiatric/Behavioral: Positive for depression. The patient is nervous/anxious.   All other systems reviewed and are negative.   Blood pressure (!) 106/59, pulse 80, temperature 99.2 F (37.3 C), temperature source Oral, resp. rate 20, height 5' 3"  (1.6 m), weight 77.2 kg (170 lb 3.1 oz), SpO2 96 %.Body mass index is 30.15 kg/m.  General Appearance: Casual  Eye Contact:  Good  Speech:  Clear and Coherent  Volume:  Normal  Mood:  Depressed  Affect:  Blunt  Thought Process:  Goal Directed and Descriptions of Associations: Intact  Orientation:  Full (Time, Place, and Person)  Thought Content:  Delusions and Paranoid Ideation  Suicidal Thoughts:  No  Homicidal Thoughts:  No  Memory:  Immediate;   Fair Recent;   Fair Remote;   Fair  Judgement:  Impaired  Insight:  Shallow  Psychomotor Activity:  Normal  Concentration:  Concentration: Fair and Attention Span: Fair  Recall:  [employer] of Knowledge:  Fair  Language:  Fair  Akathisia:  No  Handed:  Right  AIMS (if indicated):     Assets:  Agricultural consultant Housing Social Support  ADL's:  Intact  Cognition:  WNL  Sleep:        Treatment Plan Summary: Daily contact with patient to assess and evaluate symptoms and progress in treatment and Medication management   PLAN:  1. Please continue Celexa, Buspar for depression and anxiety and Haldol for psychosis.  2. I will add Remeron 15 mg tonight for depression.   3. Dr. Weber Cooks will follow up tomorrow.  Cut back a little bit on the citalopram in case she was getting oversedated from it. Continue the very low dose of antipsychotic. Patient looks like she is getting very physically sick. I will just follow up as needed.  Disposition: No evidence of imminent risk to self or others at  present.   Patient does not meet criteria for psychiatric inpatient admission. Supportive therapy provided about ongoing stressors. Discussed crisis plan, support from social network, calling 911, coming to the Emergency Department, and calling Suicide Hotline.  Alethia Berthold, MD 10/31/2016 5:09 PM

## 2016-11-01 ENCOUNTER — Inpatient Hospital Stay: Payer: Medicare Other

## 2016-11-01 DIAGNOSIS — Z7189 Other specified counseling: Secondary | ICD-10-CM

## 2016-11-01 DIAGNOSIS — M797 Fibromyalgia: Secondary | ICD-10-CM

## 2016-11-01 DIAGNOSIS — Z515 Encounter for palliative care: Secondary | ICD-10-CM

## 2016-11-01 LAB — BASIC METABOLIC PANEL
ANION GAP: 7 (ref 5–15)
BUN: 53 mg/dL — ABNORMAL HIGH (ref 6–20)
CALCIUM: 8.3 mg/dL — AB (ref 8.9–10.3)
CO2: 24 mmol/L (ref 22–32)
CREATININE: 1.83 mg/dL — AB (ref 0.44–1.00)
Chloride: 113 mmol/L — ABNORMAL HIGH (ref 101–111)
GFR calc Af Amer: 30 mL/min — ABNORMAL LOW (ref >60)
GFR, EST NON AFRICAN AMERICAN: 26 mL/min — AB (ref >60)
GLUCOSE: 104 mg/dL — AB (ref 65–99)
Potassium: 4 mmol/L (ref 3.5–5.1)
Sodium: 144 mmol/L (ref 135–145)

## 2016-11-01 LAB — CBC
HCT: 28.9 % — ABNORMAL LOW (ref 35.0–47.0)
Hemoglobin: 9.8 g/dL — ABNORMAL LOW (ref 12.0–16.0)
MCH: 31 pg (ref 26.0–34.0)
MCHC: 33.9 g/dL (ref 32.0–36.0)
MCV: 91.4 fL (ref 80.0–100.0)
PLATELETS: 190 10*3/uL (ref 150–440)
RBC: 3.16 MIL/uL — ABNORMAL LOW (ref 3.80–5.20)
RDW: 15.7 % — AB (ref 11.5–14.5)
WBC: 6.4 10*3/uL (ref 3.6–11.0)

## 2016-11-01 MED ORDER — MIRTAZAPINE 15 MG PO TABS
7.5000 mg | ORAL_TABLET | Freq: Every day | ORAL | Status: DC
  Administered 2016-11-01 – 2016-11-05 (×5): 7.5 mg via ORAL
  Filled 2016-11-01 (×5): qty 1

## 2016-11-01 NOTE — Progress Notes (Signed)
Patient ID: Misty Medina, female   DOB: [birth year], 76 y.o.   MRN: [id number]  [facility] PROGRESS NOTE  Misty Medina WUJ:[id number] DOB: November 25, 1940 DOA: 10/25/2016 PCP: Misty Fuse, MD  HPI/Subjective:  Continues to complain of paindenies any chest pains or shortness of breath    Objective: Vitals:   11/01/16 0545 11/01/16 0811  BP: 123/81 109/61  Pulse: 91 86  Resp: 20 16  Temp: 99.5 F (37.5 C) 99.4 F (37.4 C)    Filed Weights   10/25/16 2050 10/26/16 0047 10/26/16 0715  Weight: 155 lb (70.3 kg) 164 lb 1.6 oz (74.4 kg) 170 lb 3.1 oz (77.2 kg)    ROS: Review of Systems  Constitutional: Negative for chills and fever.  Eyes: Negative for blurred vision.  Respiratory: Positive for shortness of breath. Negative for cough.   Cardiovascular: Negative for chest pain.  Gastrointestinal: Negative for abdominal pain, constipation, diarrhea, nausea and vomiting.  Genitourinary: Negative for dysuria.  Musculoskeletal: Negative for joint pain.  Neurological: Positive for weakness. Negative for dizziness, sensory change and headaches.   Exam: Physical Exam  HENT:  Nose: No mucosal edema.  Mouth/Throat: No oropharyngeal exudate or posterior oropharyngeal edema.  Eyes: Conjunctivae, EOM and lids are normal. Pupils are equal, round, and reactive to light.  Neck: No JVD present. Carotid bruit is not present. No edema present. No thyroid mass and no thyromegaly present.  Cardiovascular: S1 normal and S2 normal.  Exam reveals no gallop.   No murmur heard. Pulses:      Dorsalis pedis pulses are 2+ on the right side, and 2+ on the left side.  Respiratory: No respiratory distress. She has no decreased breath sounds. She has no wheezes. She has no rhonchi. She has no rales.  GI: Soft. Bowel sounds are normal. There is no tenderness.  Musculoskeletal:       Right ankle: She exhibits swelling.       Left ankle: She exhibits swelling.  Lymphadenopathy:    She has  no cervical adenopathy.  Neurological: She is alert.  Able to wiggle toes and since that on touching her feet. Able to lift both arms up off the bed.  Skin: Skin is warm. No rash noted. Nails show no clubbing.  Psychiatric: Her mood appears anxious. She is slowed.      Data Reviewed: Basic Metabolic Panel:  Recent Labs Lab 10/25/16 2053  10/28/16 0549 10/29/16 1040 10/30/16 0617 10/31/16 0432 11/01/16 0522  NA 136  < > 137 139 138 140 144  K 5.7*  < > 4.3 4.7 4.3 4.8 4.0  CL 112*  < > 111 114* 111 116* 113*  CO2 17*  < > 19* 20* 20* 16* 24  GLUCOSE 100*  < > 86 120* 90 71 104*  BUN 60*  < > 48* 58* 63* 65* 53*  CREATININE 2.84*  < > 1.99* 2.57* 2.78* 2.82* 1.83*  CALCIUM 7.6*  < > 8.5* 8.4* 8.2* 8.0* 8.3*  MG 3.2*  --   --   --   --   --   --   < > = values in this interval not displayed. Liver Function Tests:  Recent Labs Lab 10/25/16 2053 10/27/16 0354  AST 45* 23  ALT 22 19  ALKPHOS 111 125  BILITOT 0.8 0.7  PROT 5.3* 6.4*  ALBUMIN 2.7* 3.2*    Recent Labs Lab 10/25/16 2053  LIPASE 10*   CBC:  Recent Labs Lab 10/25/16 2118 10/26/16 0437 10/27/16  0354 10/28/16 0549 11/01/16 0522  WBC 8.4 9.2 13.8* 11.1* 6.4  NEUTROABS 6.4  --   --   --   --   HGB 8.1* 7.6* 10.7* 10.0* 9.8*  HCT 25.0* 23.0* 31.0* 29.7* 28.9*  MCV 91.3 92.9 90.4 91.6 91.4  PLT 163 179 255 209 190   Cardiac Enzymes:  Recent Labs Lab 10/25/16 2053 10/26/16 1610 10/26/16 2150 10/27/16 0354  TROPONINI <0.03 0.03* <0.03 <0.03     Recent Results (from the past 240 hour(s))  MRSA PCR Screening     Status: None   Collection Time: 10/26/16  7:17 AM  Result Value Ref Range Status   MRSA by PCR NEGATIVE NEGATIVE Final    Comment:        The GeneXpert MRSA Assay (FDA approved for NASAL specimens only), is one component of a comprehensive MRSA colonization surveillance program. It is not intended to diagnose MRSA infection nor to guide or monitor treatment for MRSA  infections.   Culture, blood (Routine X 2) w Reflex to ID Panel     Status: None   Collection Time: 10/26/16  4:10 PM  Result Value Ref Range Status   Specimen Description BLOOD RIGHT ARM  Final   Special Requests   Final    BOTTLES DRAWN AEROBIC AND ANAEROBIC 10MLAERO,8MLANA   Culture NO GROWTH 5 DAYS  Final   Report Status 10/31/2016 FINAL  Final  Culture, blood (Routine X 2) w Reflex to ID Panel     Status: None   Collection Time: 10/26/16  4:10 PM  Result Value Ref Range Status   Specimen Description BLOOD LEFT HAND  Final   Special Requests BOTTLES DRAWN AEROBIC AND ANAEROBIC 6MLAERO,6MLANA  Final   Culture NO GROWTH 5 DAYS  Final   Report Status 10/31/2016 FINAL  Final     Studies: No results found.  Scheduled Meds: . ARIPiprazole  2 mg Oral Daily  . baclofen  10 mg Oral TID  . busPIRone  15 mg Oral BID  . citalopram  30 mg Oral Daily  . docusate sodium  200 mg Oral BID  . heparin  5,000 Units Subcutaneous Q8H  . levothyroxine  75 mcg Oral QAC breakfast  . mirtazapine  15 mg Oral QHS  . pantoprazole  40 mg Oral BID AC  . pregabalin  25 mg Oral Daily   Continuous Infusions:   Assessment/Plan:  1. Hypovolemic shock. Patient's blood pressure normal 2. Acute kidney injury on chronic kidney disease stage III. Seen by nephrology and renal function is improved continue to monitor 3. Hyperkalemia resolved  4. Acute hypoxic respiratory failure with Asthma exacerbation. Continue nebulizer treatments. Continue oxygen as needed 5. Diarrhea. Resolved  6. Hypothyroidism unspecified on levothyroxine 7. GERD and recent gastric ulcer on endoscopy on Protonix 8. Restless leg syndrome, Mirapexdiscontinued 9. Fibromyalgia on Lyrica (decrease dose) and baclofen 10. Depression continue psychiatric medication.seen by pychiatry    Code Status:     Code Status Orders        Start     Ordered   10/26/16 0026  Do not attempt resuscitation (DNR)  Continuous    Question Answer  Comment  In the event of cardiac or respiratory ARREST Do not call a "code blue"   In the event of cardiac or respiratory ARREST Do not perform Intubation, CPR, defibrillation or ACLS   In the event of cardiac or respiratory ARREST Use medication by any route, position, wound care, and other measures to relive pain  and suffering. May use oxygen, suction and manual treatment of airway obstruction as needed for comfort.      10/26/16 0025    Code Status History    Date Active Date Inactive Code Status Order ID Comments User Context   08/28/2016  1:22 PM 09/03/2016  3:21 AM Full Code [id number]  Shaune Pollack, MD Inpatient   09/14/2015 10:36 PM 09/16/2015  9:58 PM Full Code [id number]  Oralia Manis, MD Inpatient    Advance Directive Documentation   Flowsheet Row Most Recent Value  Type of Advance Directive  Healthcare Power of Attorney, Living will  Pre-existing out of facility DNR order (yellow form or pink MOST form)  No data  "MOST" Form in Place?  No data     Disposition Plan: Physical therapy recommended rehabilitation  Consultants:  Critical care specialist  Time spent: 25 minutes.   Allena Katz [facility]  [facility]

## 2016-11-01 NOTE — Progress Notes (Signed)
Pt found to be with temp of 101.6. Tylenol given; Primary nurse paged Dr. Claudette StaplerHuglemyer. Orders received for Blood Cultures and Chest xray. Primary  nurse to continue to monitor.

## 2016-11-01 NOTE — Progress Notes (Signed)
Subjective:   States she feels tired C/o generalized weakness Appetite is poor S Creatinine has improved to 1.83 (baseline)  Objective:  Vital signs in last 24 hours:  Temp:  [98.8 F (37.1 C)-99.5 F (37.5 C)] 99.4 F (37.4 C) (01/03 0811) Pulse Rate:  [80-91] 86 (01/03 0811) Resp:  [16-20] 16 (01/03 0811) BP: (106-123)/(59-81) 109/61 (01/03 0811) SpO2:  [96 %-99 %] 99 % (01/03 0811)  Weight change:  Filed Weights   10/25/16 2050 10/26/16 0047 10/26/16 0715  Weight: 70.3 kg (155 lb) 74.4 kg (164 lb 1.6 oz) 77.2 kg (170 lb 3.1 oz)    Intake/Output:    Intake/Output Summary (Last 24 hours) at 11/01/16 1219 Last data filed at 11/01/16 1008  Gross per 24 hour  Intake           1327.5 ml  Output              400 ml  Net            927.5 ml     Physical Exam: General: No acute distress, laying in the bed   HEENT Anicteric, moist oral mucous membranes   Neck Supple   Pulm/lungs Bibasilar crackles   CVS/Heart No rub or gallop   Abdomen:  Soft, mildly distended   Extremities: Generalized dependent pitting edema   Neurologic: Alert, able to answer questions   Skin: No acute rashes           Basic Metabolic Panel:   Recent Labs Lab 10/25/16 2053  10/28/16 0549 10/29/16 1040 10/30/16 0617 10/31/16 0432 11/01/16 0522  NA 136  < > 137 139 138 140 144  K 5.7*  < > 4.3 4.7 4.3 4.8 4.0  CL 112*  < > 111 114* 111 116* 113*  CO2 17*  < > 19* 20* 20* 16* 24  GLUCOSE 100*  < > 86 120* 90 71 104*  BUN 60*  < > 48* 58* 63* 65* 53*  CREATININE 2.84*  < > 1.99* 2.57* 2.78* 2.82* 1.83*  CALCIUM 7.6*  < > 8.5* 8.4* 8.2* 8.0* 8.3*  MG 3.2*  --   --   --   --   --   --   < > = values in this interval not displayed.   CBC:  Recent Labs Lab 10/25/16 2118 10/26/16 0437 10/27/16 0354 10/28/16 0549 11/01/16 0522  WBC 8.4 9.2 13.8* 11.1* 6.4  NEUTROABS 6.4  --   --   --   --   HGB 8.1* 7.6* 10.7* 10.0* 9.8*  HCT 25.0* 23.0* 31.0* 29.7* 28.9*  MCV 91.3 92.9 90.4  91.6 91.4  PLT 163 179 255 209 190      Microbiology:  Recent Results (from the past 720 hour(s))  MRSA PCR Screening     Status: None   Collection Time: 10/26/16  7:17 AM  Result Value Ref Range Status   MRSA by PCR NEGATIVE NEGATIVE Final    Comment:        The GeneXpert MRSA Assay (FDA approved for NASAL specimens only), is one component of a comprehensive MRSA colonization surveillance program. It is not intended to diagnose MRSA infection nor to guide or monitor treatment for MRSA infections.   Culture, blood (Routine X 2) w Reflex to ID Panel     Status: None   Collection Time: 10/26/16  4:10 PM  Result Value Ref Range Status   Specimen Description BLOOD RIGHT ARM  Final   Special Requests  Final    BOTTLES DRAWN AEROBIC AND ANAEROBIC 10MLAERO,8MLANA   Culture NO GROWTH 5 DAYS  Final   Report Status 10/31/2016 FINAL  Final  Culture, blood (Routine X 2) w Reflex to ID Panel     Status: None   Collection Time: 10/26/16  4:10 PM  Result Value Ref Range Status   Specimen Description BLOOD LEFT HAND  Final   Special Requests BOTTLES DRAWN AEROBIC AND ANAEROBIC 6MLAERO,6MLANA  Final   Culture NO GROWTH 5 DAYS  Final   Report Status 10/31/2016 FINAL  Final    Coagulation Studies: No results for input(s): LABPROT, INR in the last 72 hours.  Urinalysis: No results for input(s): COLORURINE, LABSPEC, PHURINE, GLUCOSEU, HGBUR, BILIRUBINUR, KETONESUR, PROTEINUR, UROBILINOGEN, NITRITE, LEUKOCYTESUR in the last 72 hours.  Invalid input(s): APPERANCEUR    Imaging: No results found.   Medications:    . ARIPiprazole  2 mg Oral Daily  . baclofen  10 mg Oral TID  . busPIRone  15 mg Oral BID  . citalopram  30 mg Oral Daily  . docusate sodium  200 mg Oral BID  . heparin  5,000 Units Subcutaneous Q8H  . levothyroxine  75 mcg Oral QAC breakfast  . mirtazapine  15 mg Oral QHS  . pantoprazole  40 mg Oral BID AC  . pregabalin  25 mg Oral Daily   acetaminophen **OR**  acetaminophen, HYDROcodone-acetaminophen, ipratropium-albuterol, LORazepam, morphine injection, ondansetron **OR** ondansetron (ZOFRAN) IV  Assessment/ Plan:  76 y.o. Caucasian female with hypertension, chronic sinusitis, chronic lower back pain, aortic regurgitation, collagenous colitis, hypothyroidism, depression, anemia, hyperlipidemia admitted on 10/25/2016 for dehydration and acute renal failure  1. Acute renal failure on chronic kidney disease stage IV 2. Hyperkalemia 3. Generalized edema  Baseline creatinine is 1.81/GFR 27 from 09/28/2016 Serum creatinine has improved to baseline Due to generalized edema, we'll discontinue IV fluid supplementation Encourage oral intake We'll follow   LOS: 6 Misty Medina 1/3/201812:19 PM

## 2016-11-01 NOTE — Progress Notes (Signed)
SLP Cancellation Note  Patient Details Name: Misty Medina MRN: [id number] DOB: 08/06/1941   Cancelled treatment:       Reason Eval/Treat Not Completed: Patient declined, no reason specified (reviewed chart notes; consulted NSG). NSG stated pt was not accepting po's(small amount of 1 item at lunch meal per Dietary report) at meals but has taken pills in applesauce w/ no difficulty swallowing noted. Pt appears at to have a lack of desire for oral intake at this time related to her psychiatric and/or Cognitive status.  ST will be available for any further education on aspiration precautions and diet consistency while admitted.    Jerilynn SomKatherine Watson, MS, CCC-SLP Watson,Katherine 11/01/2016, 2:33 PM

## 2016-11-01 NOTE — Progress Notes (Signed)
[facility]. Pt had expressed that she wanted to go to heaven. CH visited Pt who appeared to be exhausted. CH and Pt talked briefly, Pt said she was tired and requested CH to visit later.    11/01/16 1600  Clinical Encounter Type  Visited With Patient;Other (Comment)  Visit Type Initial;Follow-up  Referral From Nurse  Consult/Referral To Chaplain  Spiritual Encounters  Spiritual Needs Prayer;Other (Comment)  Stress Factors  Patient Stress Factors Exhausted;Health changes;Other (Comment)

## 2016-11-01 NOTE — Progress Notes (Signed)
Patient daughter called and updated on patient condition, daughter wants to know where the patient will go after she is released from the hospital. Patient is alert and self and knows where she is, patient took AM medications with applesauce. No acute distress noted.

## 2016-11-01 NOTE — Progress Notes (Signed)
Pt was found to be incontinent of urine with brief soiled. Pt was also bladder scanned and found to have 490 ml. Primary nurse notified Dr. Sheryle Hailiamond and orders received for In and out cath. Primary nurse to continue to monitor.

## 2016-11-01 NOTE — Clinical Social Work Note (Signed)
Clinical Social Work Assessment  Patient Details  Name: Misty PartridgeBrenda G Dunk MRN: [id number] Date of Birth: 03/14/1941  Date of referral:  11/01/16               Reason for consult:   (no consult; nurse informed daughter to speak with CSW)                Permission sought to share information with:    Permission granted to share information::     Name::        Agency::     Relationship::     Contact Information:     Housing/Transportation Living arrangements for the past 2 months:  Single Family Home Source of Information:  Adult Children Patient Interpreter Needed:  None Criminal Activity/Legal Involvement Pertinent to Current Situation/Hospitalization:  No - Comment as needed Significant Relationships:  Adult Children, Other Family Members (grandson) Lives with:  Other (Comment) (grandson) Do you feel safe going back to the place where you live?  Yes Need for family participation in patient care:  Yes (Comment)  Care giving concerns:  Patient lives with her grandson.   Social Worker assessment / plan:  CSW received phone call from patient's daughter this afternoon stating that the patient's nurse stated she needed to speak with me. Patient's daughter wanted to know the "plan" for patient and wanted to know the medical plan. CSW informed patient's daughter that currently the psychiatrist and the attending were going to need to determine a treatment plan for patient as patient's depression is potentially the reason behind her continuing to tell everyone that she is going to die and that she is ready to die. CSW informed patient's daughter that patient is refusing to work with PT and thus CSW is unable to place patient for rehab. CSW informed her that Palliative Care was going to be reaching out to her at some point to discuss goals of care/end of life issues and that based upon what the physicians recommend and the Paliative Care team recommends, will determine the discharge plan. Patient's  daughter verbalized understanding. CSW informed patient's daughter that she could reach out to CSW.   Employment status:  Retired Health and safety inspectornsurance information:  [employer] PT Recommendations:  Skilled Holiday representativeursing Facility, LTAC Information / Referral to community resources:     Patient/Family's Response to care:  Patient's daughter expressed appreciation for CSW time.  Patient/Family's Understanding of and Emotional Response to Diagnosis, Current Treatment, and Prognosis:  Patient's daughter states that her mother has always had an element of depression and that any time she comes into the hospital she states she is going to die but she never does and always improves and goes home. She states that this is by far the worst episode of depression that she has seen her mother go through and that it began about a week ago. Patient's daughter expressed natural concern regarding her mother.  Emotional Assessment Appearance:  Appears stated age Attitude/Demeanor/Rapport:   (patient voices she wishes to die to staff) Affect (typically observed):  Blunt, Depressed Orientation:  Oriented to Self, Oriented to Place, Oriented to  Time, Oriented to Situation Alcohol / Substance use:  Not Applicable Psych involvement (Current and /or in the community):  Yes (Comment)  Discharge Needs  Concerns to be addressed:  Care Coordination Readmission within the last 30 days:  No Current discharge risk:  None Barriers to Discharge:  No Barriers Identified   York SpanielMonica Duvid Smalls, LCSW 11/01/2016, 12:25 PM

## 2016-11-01 NOTE — Consult Note (Signed)
Consultation Note Date: 11/01/16   Patient Name: Misty Medina  DOB: 06/18/1941  MRN: [id number]  Age / Sex: 76 y.o., female  PCP: Misty Marble, MD Referring Physician: Dustin Flock, MD  Reason for Consultation: Establishing goals of care  HPI/Patient Profile: 76 y.o. female  with past medical history of depression, fibromyalgia, GERD, heart murmur, hypertension, hypothyroidism, and osteoporosis admitted on 10/25/2016 with nausea, vomiting, and diarrhea. In ED, found to have acute on chronic renal injury in the setting of dehydration. Nephrology is following-kidney functions have improved. Also with acute hypoxic respiratory failure secondary to asthma exacerbation-receiving nebulizer treatments and oxygen. Diarrhea has resolved. Fibromyalgia on Lyrica and baclofen. Seen by psych for depression-continuing celexa and buspar. Remeron added HS. Palliative medicine consultation for goals of care (patient with wish to be comfort care).   Clinical Assessment and Goals of Care: This NP met with patient at bedside. No family present. She is alert, oriented, but with flat affect and depressed mood. Misty Medina lives with her grandson, Misty Medina. She has one daughter, Misty Medina (who I spoke with via telephone after my conversation with the patient). Misty Medina tells me Misty Medina has been living with grandson for two years. Prior to that she was living at [facility] assisted living but she "hated it." Before this hospitalization, Misty. Medina was driving, able to ambulate with a walker, perform ADL's, and with good appetite. She has struggle with fibromyalgia and depression for "many years." Misty Medina tells me the past two years have been challenging because she "always finds a reason to be sick" and avoid family events. Recently, Misty Medina believes she has been more depressed and trying to get attention because her grandson has a new girlfriend. She  has been on medication for depression and fibromyalgia.   Multiple times during my conversation with the patient, she tells me she is ready to die and go to heaven. She does not want to go back home to live with grandson, not because he is "mean or abusive" but because she "doesn't want to be a burden." She wants to go "live at the hospice home." The reason she feels like she is dying is because she is in "pain all the time" and her "body is shutting down." I expressed that this is out of our control but I do not think she is eligible for a hospice home and that her mood may improve from depression medications and follow-up with psych.   Discussed advanced directives and code status. She reaffirms that she would not want to be resuscitated or on life support. She tells me she does not want to be re-hospitalized and that she "won't be here tomorrow" when I visit.   Misty Medina is at a loss of what to do. She wants her mother to be "happy and healthy" but speaks of her exhaustion with trying to motivate and encourage her when she won't comply. Misty Medina has not seen her mother since she has been in the hospital and unable to meet with me in the morning  due to work.    SUMMARY OF RECOMMENDATIONS    DNR/DNI  I do not feel patient is eligible for hospice services at this time (feel this is more psych related). Could benefit from outpatient palliative and psych.   Patient expresses she would not want to be re-hospitalized but I am unable to complete MOST form. Daughter not able to meet with me in person.   Will f/u tomorrow, 11/02/16  Code Status/Advance Care Planning:  DNR   Symptom Management:   Per attending  Palliative Prophylaxis:   Aspiration, Delirium Protocol, Frequent Pain Assessment, Oral Care and Turn Reposition  Additional Recommendations (Limitations, Scope, Preferences):  Full Scope Treatment  Psycho-social/Spiritual:   Desire for further Chaplaincy support:yes  Additional  Recommendations: Caregiving  Support/Resources  Prognosis:   Unable to determine  Discharge Planning: To Be Determined      Primary Diagnoses: Present on Admission: . Nausea vomiting and diarrhea . Acute on chronic renal failure ([place]) . Chronic systolic CHF (congestive heart failure) ([place]) . HTN (hypertension) . Hypothyroidism . GERD (gastroesophageal reflux disease) . AKI (acute kidney injury) ([place]) . Acute kidney injury ([place]) . Severe recurrent major depressive disorder with psychotic features ([place])   I have reviewed the medical record, interviewed the patient and family, and examined the patient. The following aspects are pertinent.  Past Medical History:  Diagnosis Date  . Depression   . Fibromyalgia   . GERD (gastroesophageal reflux disease)   . Heart murmur   . Hypertension   . Hypothyroidism   . Osteoporosis    Social History   Social History  . Marital status: Divorced    Spouse name: N/A  . Number of children: N/A  . Years of education: N/A   Social History Main Topics  . Smoking status: Never Smoker  . Smokeless tobacco: Never Used  . Alcohol use No  . Drug use: No  . Sexual activity: Not Asked   Other Topics Concern  . None   Social History Narrative  . None   Family History  Problem Relation Age of Onset  . Hypertension Mother   . Osteoporosis Mother   . Arthritis Father   . Heart attack Father    Scheduled Meds: . ARIPiprazole  2 mg Oral Daily  . baclofen  10 mg Oral TID  . cefTRIAXone  1 g Intravenous Q24H  . citalopram  30 mg Oral Daily  . docusate sodium  200 mg Oral BID  . heparin  5,000 Units Subcutaneous Q8H  . levothyroxine  75 mcg Oral QAC breakfast  . mirtazapine  7.5 mg Oral QHS  . pantoprazole  40 mg Oral BID AC  . pregabalin  25 mg Oral Daily  . tamsulosin  0.4 mg Oral Daily   Continuous Infusions: PRN Meds:.acetaminophen **OR** acetaminophen, HYDROcodone-acetaminophen, ipratropium-albuterol, LORazepam, morphine  injection, ondansetron **OR** ondansetron (ZOFRAN) IV Medications Prior to Admission:  Prior to Admission medications   Medication Sig Start Date End Date Taking? Authorizing Provider  b complex vitamins tablet Take 1 tablet by mouth daily.   Yes Historical Provider, MD  baclofen (LIORESAL) 10 MG tablet Take 10 mg by mouth 3 (three) times daily.   Yes Historical Provider, MD  Biotin 1000 MCG tablet Take 1,000 mcg by mouth daily.   Yes Historical Provider, MD  busPIRone (BUSPAR) 15 MG tablet Take 15 mg by mouth 2 (two) times daily.   Yes Historical Provider, MD  carvedilol (COREG) 6.25 MG tablet Take 6.25 mg by mouth 2 (two)  times daily with a meal.   Yes Historical Provider, MD  chlorthalidone (HYGROTON) 25 MG tablet Take 0.5 tablets (12.5 mg total) by mouth every other day. 09/16/15  Yes Theodoro Grist, MD  Cholecalciferol (VITAMIN D-3) 1000 UNITS CAPS Take 1,000 Units by mouth daily.   Yes Historical Provider, MD  citalopram (CELEXA) 40 MG tablet Take 40 mg by mouth daily.   Yes Historical Provider, MD  DUREZOL 0.05 % EMUL Place 1 drop into both eyes daily. 09/28/16  Yes Historical Provider, MD  ferrous fumarate-iron polysaccharide complex (TANDEM) 162-115.2 MG CAPS capsule Take 1 capsule by mouth daily.   Yes Historical Provider, MD  HYDROcodone-acetaminophen (NORCO) 10-325 MG tablet Take 1 tablet by mouth every 6 (six) hours as needed for moderate pain.   Yes Historical Provider, MD  ILEVRO 0.3 % ophthalmic suspension Place 1 drop into both eyes daily. 09/28/16  Yes Historical Provider, MD  levothyroxine (SYNTHROID, LEVOTHROID) 75 MCG tablet Take 75 mcg by mouth daily.   Yes Historical Provider, MD  lisinopril (PRINIVIL,ZESTRIL) 5 MG tablet Take 5 mg by mouth every morning. 10/04/16  Yes Historical Provider, MD  loratadine (CLARITIN) 10 MG tablet Take 10 mg by mouth daily.   Yes Historical Provider, MD  magnesium oxide (MAG-OX) 400 MG tablet Take 400 mg by mouth daily.   Yes Historical Provider,  MD  ondansetron (ZOFRAN) 4 MG tablet Take 1 tablet (4 mg total) by mouth every 6 (six) hours as needed for nausea. 09/02/16  Yes Srikar Sudini, MD  pantoprazole (PROTONIX) 40 MG tablet Take 1 tablet (40 mg total) by mouth 2 (two) times daily before a meal. 09/02/16  Yes Srikar Sudini, MD  pregabalin (LYRICA) 25 MG capsule Take 25 mg by mouth 2 (two) times daily.   Yes Historical Provider, MD  rOPINIRole (REQUIP) 0.5 MG tablet Take 1 tablet by mouth every evening. 10/04/16  Yes Historical Provider, MD  tolterodine (DETROL LA) 4 MG 24 hr capsule Take 4 mg by mouth daily.   Yes Historical Provider, MD  zolpidem (AMBIEN) 5 MG tablet Take 0.5 tablets (2.5 mg total) by mouth at bedtime as needed for sleep. 09/16/15  Yes Theodoro Grist, MD   Allergies  Allergen Reactions  . Codeine Nausea And Vomiting  . Penicillins Other (See Comments)    Reaction:  Unknown    Review of Systems  Unable to perform ROS: Other  Psychiatric/Behavioral: Negative for agitation ().   Physical Exam  Constitutional: She is oriented to person, place, and time. She appears ill.  HENT:  Head: Normocephalic and atraumatic.  Cardiovascular: Regular rhythm and normal heart sounds.   Pulmonary/Chest: Effort normal. She has wheezes.  Abdominal: Normal appearance and bowel sounds are normal.  Neurological: She is alert and oriented to person, place, and time.  Skin: Skin is warm and dry. There is pallor.  Psychiatric: Cognition and memory are normal. She exhibits a depressed mood.  Nursing note and vitals reviewed.  Vital Signs: BP 111/66 (BP Location: Left Arm)   Medina 88   Temp 100 F (37.8 C) (Oral)   Resp (!) 22   Ht _0  (1.6 m)   Wt 77.2 kg (170 lb 3.1 oz)   SpO2 99%   BMI 30.15 kg/m  Pain Assessment: 0-10 POSS *See Group Information*: 2-Acceptable,Slightly drowsy, easily aroused Pain Score: Asleep   SpO2: SpO2: 99 % O2 Device:SpO2: 99 % O2 Flow Rate: .O2 Flow Rate (L/min): 3 L/min  IO: Intake/output  summary:   Intake/Output Summary (Last  24 hours) at 11/02/16 0824 Last data filed at 11/02/16 0505  Gross per 24 hour  Intake              118 ml  Output              600 ml  Net             -482 ml    LBM: Last BM Date: 11/01/16 Baseline Weight: Weight: 70.3 kg (155 lb) Most recent weight: Weight: 77.2 kg (170 lb 3.1 oz)     Palliative Assessment/Data: PPS 40%   Flowsheet Rows   Flowsheet Row Most Recent Value  Intake Tab  Referral Department  Hospitalist  Unit at Time of Referral  Cardiac/Telemetry Unit  Palliative Care Primary Diagnosis  Other (Comment)  Palliative Care Type  New Palliative care  Reason for referral  Clarify Goals of Care  Date of Admission  10/25/16  Date first seen by Palliative Care  11/01/16  Clinical Assessment  Palliative Performance Scale Score  40%  Psychosocial & Spiritual Assessment  Palliative Care Outcomes  Patient/Family meeting held?  Yes  Who was at the meeting?  patient and daughter via telephone  Palliative Care Outcomes  Clarified goals of care, Provided psychosocial or spiritual support, Counseled regarding hospice, Provided advance care planning     Time In: 1500 Time Out: 1620 Time Total: 7mn Greater than 50%  of this time was spent counseling and coordinating care related to the above assessment and plan.  Signed by:  MIhor Dow FNP-C Palliative Medicine Team  Phone: [phone]: [phone]  Please contact Palliative Medicine Team phone at [phone] questions and concerns.  For individual provider: See AShea Evans

## 2016-11-02 DIAGNOSIS — M797 Fibromyalgia: Secondary | ICD-10-CM

## 2016-11-02 DIAGNOSIS — Z7189 Other specified counseling: Secondary | ICD-10-CM

## 2016-11-02 DIAGNOSIS — Z515 Encounter for palliative care: Secondary | ICD-10-CM

## 2016-11-02 LAB — URINALYSIS, COMPLETE (UACMP) WITH MICROSCOPIC
BILIRUBIN URINE: NEGATIVE
GLUCOSE, UA: NEGATIVE mg/dL
KETONES UR: NEGATIVE mg/dL
Nitrite: NEGATIVE
PROTEIN: NEGATIVE mg/dL
Specific Gravity, Urine: 1.012 (ref 1.005–1.030)
Squamous Epithelial / LPF: NONE SEEN
pH: 5 (ref 5.0–8.0)

## 2016-11-02 MED ORDER — TAMSULOSIN HCL 0.4 MG PO CAPS
0.4000 mg | ORAL_CAPSULE | Freq: Every day | ORAL | Status: DC
  Administered 2016-11-02 – 2016-11-06 (×5): 0.4 mg via ORAL
  Filled 2016-11-02 (×5): qty 1

## 2016-11-02 MED ORDER — DEXTROSE 5 % IV SOLN: 1.0000 g | INTRAVENOUS | Status: DC

## 2016-11-02 MED ORDER — CEFTRIAXONE SODIUM-DEXTROSE 1-3.74 GM-% IV SOLR
1.0000 g | INTRAVENOUS | Status: DC
  Administered 2016-11-02 – 2016-11-06 (×5): 1 g via INTRAVENOUS
  Filled 2016-11-02 (×6): qty 50

## 2016-11-02 NOTE — Progress Notes (Signed)
Pt Lab results from [facility] made aware to Dr. Charlann LangeVaichani by Primary Nurse. Orders received for rocephin, 1 gram IV q 24 hr.Primary nurse to continue to monitor.

## 2016-11-02 NOTE — Consult Note (Signed)
[facility] Face-to-Face Psychiatry Consult   Reason for Consult: Depression. Referring Physician:  Dr. Leslye Peer Patient Identification: Misty Medina MRN:  [id number] Principal Diagnosis: Severe recurrent major depressive disorder with psychotic features [facility]) Diagnosis:   Patient Active Problem List   Diagnosis Date Noted  . Fibromyalgia [M79.7]   . Palliative care encounter [Z51.5]   . Goals of care, counseling/discussion [Z71.89]   . Acute kidney injury ([place]) [N17.9] 10/26/2016  . Nausea vomiting and diarrhea [R11.2, R19.7] 10/25/2016  . Acute on chronic renal failure ([place]) [N17.9, N18.9] 10/25/2016  . AKI (acute kidney injury) ([place]) [N17.9] 10/25/2016  . Gastric ulcer [K25.9] 09/02/2016  . E. coli UTI [N39.0, B96.20] 09/02/2016  . Aspiration pneumonia ([place]) [J69.0] 09/02/2016  . Acute blood loss anemia [D62] 09/02/2016  . Hypoxia [R09.02] 09/02/2016  . GIB (gastrointestinal bleeding) [K92.2] 08/28/2016  . Fracture of fifth toe, left, closed [S92.502A] 09/16/2015  . Blister of foot without infection [S90.829A] 09/16/2015  . Constipation [K59.00] 09/16/2015  . Falls 581 549 2693.XXXA] 09/15/2015  . Frequent falls [R29.6] 09/14/2015  . Foot fracture, left [S92.902A] 09/14/2015  . Hypothyroidism [E03.9] 09/14/2015  . Chronic systolic CHF (congestive heart failure) ([place]) [I50.22] 09/14/2015  . HTN (hypertension) [I10] 09/14/2015  . GERD (gastroesophageal reflux disease) [K21.9] 09/14/2015  . Severe recurrent major depressive disorder with psychotic features ([place]) [F33.3] 09/14/2015    Total Time spent with patient: 15 minutes   12/31 this is a follow up visit. Misty Medina appears more depressed than yesterday. Her voice is hardly audible, she seems somnolent. She hardly touched her lunch. She still believes that she is dying and will not be here to talk to me tomorrow. Still thinks that her "organs are shutting down". She is ready to "give up". She however denies any suicidal ideation or plans.  Her family visited last night but the patient does not feel better. We will continue BuSpar, Celexa and low dose Haldol. I will add Remeron 15 mg nightly for depression.  Follow-up psychiatric consult for this 76 year old woman on the medical service. I found her today to be essentially the same way that Dr. Mamie Nick had described her over the last couple days. The patient was awake with her eyes open but all she wanted to talk about was how she was dying and all of her organs were shutting down. She denies having any intention or plan of doing anything to harm herself and she seems to be basically compliant. She denied any hallucinations. Her affect however is flat and withdrawn. Very negative.  Followed up for January 2. Patient seen chart reviewed. Patient looks even worse than yesterday. She had her eyes open and made some effort to speak but could barely get any words out. Looks very dehydrated. Didn't make any other lucid comments that I was able to understand. Doesn't appear to be making any action to try to harm herself. Follow-up for January 4. Patient seen. My delight, the patient looks much better today. She was awake and alert and told me that she was feeling much better. She was able to eat a full cup of ice cream quickly and without difficulty and drink water without any difficulty. He expresses that she is looking forward to going to rehabilitation.  Identifying data. Misty Medina is a 76 y.o. female with a history of depression and anxiety.  Chief complaint. "My organs are shutting down."  History of present illness. Information was obtained from the patient and the chart. The patient has a history of depression  and anxiety with infrequent panic attacks. She has been treated with a combination of Celexa and BuSpar prescribed by her primary care physician with excellent results. 4 days ago and the patient became physically ill with nausea, diarrhea, and vomiting. She was admitted to the  hospital for dehydration. Consult was called because the patient felt that "she was dying". The patient reports that for the past 4 days she has felt that she is about to die and that her "organs are shutting down". She tells me that she is ready to "meet her maker" and wants to go "without pain and suffering". She is asking to see a chaplain again. She tells me that she does not wish her family to visit any more. When I told her that I would be back tomorrow, she assured me that she will be dead by then. On direct questioning, she denies any symptoms of depression, anxiety, or psychosis, symptoms suggestive of bipolar illness, or drug or alcohol use. She adamantly denies any thoughts, intentions, or plans to hurt herself or others.  Past psychiatric history. Mild depression and anxiety treated with Celexa and BuSpar by her primary provider. She has never been in a psychiatric hospital. There were no suicide attempts.  Family psychiatric history. Nonreported.  Social history. She lives with her grandson who helps her manage medication.  Risk to Self: Is patient at risk for suicide?: No Risk to Others:   Prior Inpatient Therapy:   Prior Outpatient Therapy:    Past Medical History:  Past Medical History:  Diagnosis Date  . Depression   . Fibromyalgia   . GERD (gastroesophageal reflux disease)   . Heart murmur   . Hypertension   . Hypothyroidism   . Osteoporosis     Past Surgical History:  Procedure Laterality Date  . ABDOMINAL HYSTERECTOMY    . CHOLECYSTECTOMY    . ESOPHAGOGASTRODUODENOSCOPY (EGD) WITH PROPOFOL N/A 08/29/2016   Procedure: ESOPHAGOGASTRODUODENOSCOPY (EGD) WITH PROPOFOL;  Surgeon: Drinda Butts, MD;  Location[facility] ENDOSCOPY;  Service: Endoscopy;  Laterality: N/A;   Family History:  Family History  Problem Relation Age of Onset  . Hypertension Mother   . Osteoporosis Mother   . Arthritis Father   . Heart attack Father    Social History:  History  Alcohol Use No      History  Drug Use No    Social History   Social History  . Marital status: Divorced    Spouse name: N/A  . Number of children: N/A  . Years of education: N/A   Social History Main Topics  . Smoking status: Never Smoker  . Smokeless tobacco: Never Used  . Alcohol use No  . Drug use: No  . Sexual activity: Not Asked   Other Topics Concern  . None   Social History Narrative  . None   Additional Social History:    Allergies:   Allergies  Allergen Reactions  . Codeine Nausea And Vomiting  . Penicillins Other (See Comments)    Reaction:  Unknown     Labs:  Results for orders placed or performed during the hospital encounter of 10/25/16 (from the past 48 hour(s))  CBC     Status: Abnormal   Collection Time: 11/01/16  5:22 AM  Result Value Ref Range   WBC 6.4 3.6 - 11.0 K/uL   RBC 3.16 (L) 3.80 - 5.20 MIL/uL   Hemoglobin 9.8 (L) 12.0 - 16.0 g/dL   HCT 28.9 (L) 35.0 - 47.0 %   MCV  91.4 80.0 - 100.0 fL   MCH 31.0 26.0 - 34.0 pg   MCHC 33.9 32.0 - 36.0 g/dL   RDW 15.7 (H) 11.5 - 14.5 %   Platelets 190 150 - 440 K/uL  Basic metabolic panel     Status: Abnormal   Collection Time: 11/01/16  5:22 AM  Result Value Ref Range   Sodium 144 135 - 145 mmol/L   Potassium 4.0 3.5 - 5.1 mmol/L   Chloride 113 (H) 101 - 111 mmol/L   CO2 24 22 - 32 mmol/L   Glucose, Bld 104 (H) 65 - 99 mg/dL   BUN 53 (H) 6 - 20 mg/dL   Creatinine, Ser 1.83 (H) 0.44 - 1.00 mg/dL   Calcium 8.3 (L) 8.9 - 10.3 mg/dL   GFR calc non Af Amer 26 (L) >60 mL/min   GFR calc Af Amer 30 (L) >60 mL/min    Comment: (NOTE) The eGFR has been calculated using the CKD EPI equation. This calculation has not been validated in all clinical situations. eGFR's persistently <60 mL/min signify possible Chronic Kidney Disease.    Anion gap 7 5 - 15  CULTURE, BLOOD (ROUTINE X 2) w Reflex to ID Panel     Status: None (Preliminary result)   Collection Time: 11/01/16  9:40 PM  Result Value Ref Range   Specimen  Description BLOOD  R ARM    Special Requests      BOTTLES DRAWN AEROBIC AND ANAEROBIC  AER 3 ML ANA 2 ML   Culture NO GROWTH < 12 HOURS    Report Status PENDING   CULTURE, BLOOD (ROUTINE X 2) w Reflex to ID Panel     Status: None (Preliminary result)   Collection Time: 11/02/16 12:35 AM  Result Value Ref Range   Specimen Description BLOOD  R AC    Special Requests      BOTTLES DRAWN AEROBIC AND ANAEROBIC  AER 3 ML ANA 4 ML   Culture NO GROWTH < 12 HOURS    Report Status PENDING   Urinalysis, Complete w Microscopic     Status: Abnormal   Collection Time: 11/02/16  5:10 AM  Result Value Ref Range   Color, Urine YELLOW (A) YELLOW   APPearance CLOUDY (A) CLEAR   Specific Gravity, Urine 1.012 1.005 - 1.030   pH 5.0 5.0 - 8.0   Glucose, UA NEGATIVE NEGATIVE mg/dL   Hgb urine dipstick SMALL (A) NEGATIVE   Bilirubin Urine NEGATIVE NEGATIVE   Ketones, ur NEGATIVE NEGATIVE mg/dL   Protein, ur NEGATIVE NEGATIVE mg/dL   Nitrite NEGATIVE NEGATIVE   Leukocytes, UA LARGE (A) NEGATIVE   RBC / HPF TOO NUMEROUS TO COUNT 0 - 5 RBC/hpf   WBC, UA TOO NUMEROUS TO COUNT 0 - 5 WBC/hpf   Bacteria, UA RARE (A) NONE SEEN   Squamous Epithelial / LPF NONE SEEN NONE SEEN   WBC Clumps PRESENT     Current Facility-Administered Medications  Medication Dose Route Frequency Provider Last Rate Last Dose  . acetaminophen (TYLENOL) tablet 650 mg  650 mg Oral Q6H PRN Lance Coon, MD   650 mg at 11/01/16 2104   Or  . acetaminophen (TYLENOL) suppository 650 mg  650 mg Rectal Q6H PRN Lance Coon, MD      . ARIPiprazole (ABILIFY) tablet 2 mg  2 mg Oral Daily Gonzella Lex, MD   2 mg at 11/02/16 1005  . baclofen (LIORESAL) tablet 10 mg  10 mg Oral TID Lance Coon, MD  10 mg at 11/02/16 1554  . cefTRIAXone (ROCEPHIN) IVPB 1 g  1 g Intravenous Q24H Dustin Flock, MD   1 g at 11/02/16 1006  . citalopram (CELEXA) tablet 30 mg  30 mg Oral Daily Gonzella Lex, MD   30 mg at 11/02/16 1005  . docusate sodium (COLACE)  capsule 200 mg  200 mg Oral BID Dustin Flock, MD   200 mg at 11/02/16 1005  . heparin injection 5,000 Units  5,000 Units Subcutaneous Q8H Lance Coon, MD   5,000 Units at 11/02/16 1554  . HYDROcodone-acetaminophen (NORCO) 10-325 MG per tablet 1 tablet  1 tablet Oral Q6H PRN Lance Coon, MD   1 tablet at 11/01/16 2105  . ipratropium-albuterol (DUONEB) 0.5-2.5 (3) MG/3ML nebulizer solution 3 mL  3 mL Nebulization Q6H PRN Dustin Flock, MD   3 mL at 11/02/16 0540  . levothyroxine (SYNTHROID, LEVOTHROID) tablet 75 mcg  75 mcg Oral QAC breakfast Lance Coon, MD   75 mcg at 11/02/16 1005  . LORazepam (ATIVAN) injection 1 mg  1 mg Intravenous Q6H PRN Dustin Flock, MD      . mirtazapine (REMERON) tablet 7.5 mg  7.5 mg Oral QHS Gonzella Lex, MD   7.5 mg at 11/01/16 2105  . morphine 4 MG/ML injection 2 mg  2 mg Intravenous Q3H PRN Dustin Flock, MD   2 mg at 11/02/16 0052  . ondansetron (ZOFRAN) tablet 4 mg  4 mg Oral Q6H PRN Lance Coon, MD       Or  . ondansetron [facility]) injection 4 mg  4 mg Intravenous Q6H PRN Lance Coon, MD   4 mg at 10/26/16 1948  . pantoprazole (PROTONIX) EC tablet 40 mg  40 mg Oral BID AC Lance Coon, MD   40 mg at 11/02/16 1600  . pregabalin (LYRICA) capsule 25 mg  25 mg Oral Daily Loletha Grayer, MD   25 mg at 11/02/16 1006  . tamsulosin (FLOMAX) capsule 0.4 mg  0.4 mg Oral Daily Vaughan Basta, MD   0.4 mg at 11/02/16 1005    Musculoskeletal: Strength & Muscle Tone: within normal limits Gait & Station: normal Patient leans: N/A  Psychiatric Specialty Exam: Physical Exam  Nursing note and vitals reviewed. Psychiatric: She has a normal mood and affect. Judgment normal. Her speech is delayed. She is slowed. Thought content is not paranoid. She expresses no homicidal and no suicidal ideation.    Review of Systems  Gastrointestinal: Negative for abdominal pain, diarrhea, nausea and vomiting.  Psychiatric/Behavioral: Negative for depression. The patient  is nervous/anxious.   All other systems reviewed and are negative.   Blood pressure (!) 113/57, pulse 89, temperature 99.2 F (37.3 C), temperature source Oral, resp. rate (!) 22, height 5' 3"  (1.6 m), weight 77.2 kg (170 lb 3.1 oz), SpO2 100 %.Body mass index is 30.15 kg/m.  General Appearance: Casual  Eye Contact:  Good  Speech:  Clear and Coherent  Volume:  Normal  Mood:  Euthymic  Affect:  Blunt  Thought Process:  Goal Directed and Descriptions of Associations: Intact  Orientation:  Full (Time, Place, and Person)  Thought Content:  Logical  Suicidal Thoughts:  No  Homicidal Thoughts:  No  Memory:  Immediate;   Fair Recent;   Fair Remote;   Fair  Judgement:  Impaired  Insight:  Shallow  Psychomotor Activity:  Normal  Concentration:  Concentration: Fair and Attention Span: Fair  Recall:  [employer] of Knowledge:  Fair  Language:  Fair  Akathisia:  No  Handed:  Right  AIMS (if indicated):     Assets:  Agricultural consultant Housing Social Support  ADL's:  Intact  Cognition:  WNL  Sleep:        Treatment Plan Summary: Daily contact with patient to assess and evaluate symptoms and progress in treatment and Medication management   PLAN:  1. Please continue Celexa, Buspar for depression and anxiety and Haldol for psychosis.  2. I will add Remeron 15 mg tonight for depression.   3. Dr. Weber Cooks will follow up tomorrow.  I think she is looking better now. No need to change any further medication. Made some decreases in medicine when she looked over sedated. Mood seems to be stabilizing now and she may be ready for discharge soon.  Disposition: No evidence of imminent risk to self or others at present.   Patient does not meet criteria for psychiatric inpatient admission. Supportive therapy provided about ongoing stressors. Discussed crisis plan, support from social network, calling 911, coming to the Emergency Department, and calling Suicide  Hotline.  Alethia Berthold, MD 11/02/2016 5:03 PM

## 2016-11-02 NOTE — Progress Notes (Signed)
Pt was bladder scanned and noted to have 620 ml of urine. Primary nurse notified Dr. Charlann LangeVaichani and orders received for flomax along with In and Out cath. Pt in and out cathed as ordered. Urine had some cloudy contents. Dr. Charlann LangeVaichani notified and orders received for U/A. Urine  sent to lab, awaiting results. Primary nurse to continue to monitor.

## 2016-11-02 NOTE — Progress Notes (Signed)
RN received verbal order from Dr. Allena KatzPatel to in and out pt to receive urine culture. RN received 250 mL of urine.   Hanna [facility] [place]

## 2016-11-02 NOTE — Progress Notes (Signed)
Daily Progress Note   Patient Name: Misty Medina       Date: 11/02/2016 DOB: Aug 17, 1941  Age: 76 y.o. MRN#: [id number] Attending Physician: Auburn Bilberry, MD Primary Care Physician: Luna Fuse, MD Admit Date: 10/25/2016  Reason for Consultation/Follow-up: Establishing goals of care  Subjective: Upon arrival to room, patient alert and oriented. Mood is greatly improved from my conversation with her yesterday. She is smiling and tells me she "feels much better today." She participated with physical therapy this morning.   Updated daughter, Olegario Messier via telephone. She is glad to hear of improvement for now. Discussed palliative services to follow outpatient, which Olegario Messier agrees with.   Length of Stay: 7  Current Medications: Scheduled Meds:  . ARIPiprazole  2 mg Oral Daily  . baclofen  10 mg Oral TID  . cefTRIAXone  1 g Intravenous Q24H  . citalopram  30 mg Oral Daily  . docusate sodium  200 mg Oral BID  . heparin  5,000 Units Subcutaneous Q8H  . levothyroxine  75 mcg Oral QAC breakfast  . mirtazapine  7.5 mg Oral QHS  . pantoprazole  40 mg Oral BID AC  . pregabalin  25 mg Oral Daily  . tamsulosin  0.4 mg Oral Daily    Continuous Infusions:  PRN Meds: acetaminophen **OR** acetaminophen, HYDROcodone-acetaminophen, ipratropium-albuterol, LORazepam, morphine injection, ondansetron **OR** ondansetron (ZOFRAN) IV  Physical Exam  Constitutional: She is oriented to person, place, and time. She is cooperative.  Cardiovascular: Regular rhythm and normal heart sounds.   Pulmonary/Chest: Effort normal. She has wheezes.  Abdominal: Soft. Bowel sounds are normal.  Neurological: She is alert and oriented to person, place, and time.  Skin: Skin is warm and dry. There is pallor.    Psychiatric: She has a normal mood and affect. Her speech is normal and behavior is normal. Cognition and memory are normal.  Nursing note and vitals reviewed.          Vital Signs: BP 111/66 (BP Location: Left Arm)   Pulse 88   Temp 100 F (37.8 C) (Oral)   Resp (!) 22   Ht 5\' 3"  (1.6 m)   Wt 77.2 kg (170 lb 3.1 oz)   SpO2 99%   BMI 30.15 kg/m  SpO2: SpO2: 99 % O2 Device: O2 Device: Nasal Cannula O2 Flow  Rate: O2 Flow Rate (L/min): 3 L/min  Intake/output summary:  Intake/Output Summary (Last 24 hours) at 11/02/16 1046 Last data filed at 11/02/16 1017  Gross per 24 hour  Intake              238 ml  Output              600 ml  Net             -362 ml   LBM: Last BM Date: 11/01/16 Baseline Weight: Weight: 70.3 kg (155 lb) Most recent weight: Weight: 77.2 kg (170 lb 3.1 oz)    Palliative Assessment/Data: PPS 40%   Flowsheet Rows   Flowsheet Row Most Recent Value  Intake Tab  Referral Department  Hospitalist  Unit at Time of Referral  Cardiac/Telemetry Unit  Palliative Care Primary Diagnosis  Other (Comment)  Palliative Care Type  New Palliative care  Reason for referral  Clarify Goals of Care  Date of Admission  10/25/16  Date first seen by Palliative Care  11/01/16  Clinical Assessment  Palliative Performance Scale Score  40%  Psychosocial & Spiritual Assessment  Palliative Care Outcomes  Patient/Family meeting held?  Yes  Who was at the meeting?  patient and daughter via telephone  Palliative Care Outcomes  Clarified goals of care, Provided psychosocial or spiritual support, Counseled regarding hospice, Provided advance care planning      Patient Active Problem List   Diagnosis Date Noted  . Fibromyalgia   . Palliative care encounter   . Goals of care, counseling/discussion   . Acute kidney injury (HCC) 10/26/2016  . Nausea vomiting and diarrhea 10/25/2016  . Acute on chronic renal failure (HCC) 10/25/2016  . AKI (acute kidney injury) (H[facility]) 10/25/2016  .  Gastric ulcer 09/02/2016  . E. coli UTI 09/02/2016  . Aspiration pneumonia (HCC) 09/02/2016  . Acute blood loss anemia 09/02/2016  . Hypoxia 09/02/2016  . GIB (gastrointestinal bleeding) 08/28/2016  . Fracture of fifth toe, left, closed 09/16/2015  . Blister of foot without infection 09/16/2015  . Constipation 09/16/2015  . Falls 09/15/2015  . Frequent falls 09/14/2015  . Foot fracture, left 09/14/2015  . Hypothyroidism 09/14/2015  . Chronic systolic CHF (congestive heart failure) (HCC) 09/14/2015  . HTN (hypertension) 09/14/2015  . GERD (gastroesophageal reflux disease) 09/14/2015  . Severe recurrent major depressive disorder with psychotic features (HCC) 09/14/2015    Palliative Care Assessment & Plan   Patient Profile: 76 y.o. female  with past medical history of depression, fibromyalgia, GERD, heart murmur, hypertension, hypothyroidism, and osteoporosis admitted on 10/25/2016 with nausea, vomiting, and diarrhea. In ED, found to have acute on chronic renal injury in the setting of dehydration. Nephrology is following-kidney functions have improved. Also with acute hypoxic respiratory failure secondary to asthma exacerbation-receiving nebulizer treatments and oxygen. Diarrhea has resolved. Fibromyalgia on Lyrica and baclofen. Seen by psych for depression-continuing celexa and buspar. Remeron added HS. Palliative medicine consultation for goals of care (patient with wish to be comfort care).   Assessment: Depression Fibromyalgia Acute kidney injury on chronic kidney disease stage III Acute hypoxic respiratory failure with asthma exacerbation Hyperkalemia resolved Diarrhea resolved  Recommendations/Plan:  Palliative services to follow outpatient. Daughter agrees with this.   PMT will continue to shadow and provide support to patient and family during hospitalization.   Goals of Care and Additional Recommendations:  Limitations on Scope of Treatment: Full Scope Treatment-except  DNR/DNI  Code Status: DNR   Code Status Orders  Start     Ordered   10/26/16 0026  Do not attempt resuscitation (DNR)  Continuous    Question Answer Comment  In the event of cardiac or respiratory ARREST Do not call a "code blue"   In the event of cardiac or respiratory ARREST Do not perform Intubation, CPR, defibrillation or ACLS   In the event of cardiac or respiratory ARREST Use medication by any route, position, wound care, and other measures to relive pain and suffering. May use oxygen, suction and manual treatment of airway obstruction as needed for comfort.      10/26/16 0025    Code Status History    Date Active Date Inactive Code Status Order ID Comments User Context   08/28/2016  1:22 PM 09/03/2016  3:21 AM Full Code [id number]  Shaune PollackQing Chen, MD Inpatient   09/14/2015 10:36 PM 09/16/2015  9:58 PM Full Code [id number]  Oralia Manisavid Willis, MD Inpatient    Advance Directive Documentation   Flowsheet Row Most Recent Value  Type of Advance Directive  Healthcare Power of Attorney, Living will  Pre-existing out of facility DNR order (yellow form or pink MOST form)  No data  "MOST" Form in Place?  No data       Prognosis:   Unable to determine  Discharge Planning:  Skilled Nursing Facility for rehab with Palliative care service follow-up  Care plan was discussed with patient, SW/CM, daughter, and Dr. Allena KatzPatel  Thank you for allowing the Palliative Medicine Team to assist in the care of this patient.   Time In: 1025 Time Out: 1100 Total Time 35min Prolonged Time Billed  no       Greater than 50%  of this time was spent counseling and coordinating care related to the above assessment and plan.  Vennie HomansMegan Demitria Hay, FNP-C Palliative Medicine Team  Phone: [phone] Fax: [phone]  Please contact Palliative Medicine Team phone at [phone] for questions and concerns.

## 2016-11-02 NOTE — Clinical Social Work Note (Signed)
Patient has worked with PT today and they are recommending STR. CSW spoke with patient who was smiling and laughing with CSW today and she is agreeable for rehab. She states that [facility]. [facility] initiated. Pasrr is pending. York SpanielMonica Phares Zaccone MSW,LCSW [phone]

## 2016-11-02 NOTE — [place] FL2 (Signed)
[place] MEDICAID FL2 LEVEL OF CARE SCREENING TOOL     IDENTIFICATION  Patient Name: Misty Medina Birthdate: 1940-12-07 Sex: female Admission Date (Current Location): 10/25/2016  [place] and [place] Number:  Chiropodist and Address:  [facility], [address] [place], [place] [address]      Provider Number: [id number]  Attending Physician Name and Address:  Auburn Bilberry, MD  Relative Name and Phone Number:       Current Level of Care: Hospital Recommended Level of Care: Skilled Nursing Facility Prior Approval Number:    Date Approved/Denied:   PASRR Number:    Discharge Plan: SNF    Current Diagnoses: Patient Active Problem List   Diagnosis Date Noted  . Fibromyalgia   . Palliative care encounter   . Goals of care, counseling/discussion   . Acute kidney injury (HCC) 10/26/2016  . Nausea vomiting and diarrhea 10/25/2016  . Acute on chronic renal failure (HCC) 10/25/2016  . AKI (acute kidney injury) (HCC) 10/25/2016  . Gastric ulcer 09/02/2016  . E. coli UTI 09/02/2016  . Aspiration pneumonia (HCC) 09/02/2016  . Acute blood loss anemia 09/02/2016  . Hypoxia 09/02/2016  . GIB (gastrointestinal bleeding) 08/28/2016  . Fracture of fifth toe, left, closed 09/16/2015  . Blister of foot without infection 09/16/2015  . Constipation 09/16/2015  . Falls 09/15/2015  . Frequent falls 09/14/2015  . Foot fracture, left 09/14/2015  . Hypothyroidism 09/14/2015  . Chronic systolic CHF (congestive heart failure) (HCC) 09/14/2015  . HTN (hypertension) 09/14/2015  . GERD (gastroesophageal reflux disease) 09/14/2015  . Severe recurrent major depressive disorder with psychotic features (HCC) 09/14/2015    Orientation RESPIRATION BLADDER Height & Weight     Self, Place, Situation  Normal, O2 (3 liters) Incontinent Weight: 170 lb 3.1 oz (77.2 kg) Height:  5\' 3"  (160 cm)  BEHAVIORAL SYMPTOMS/MOOD NEUROLOGICAL BOWEL NUTRITION STATUS   (none)  (none) Incontinent Diet (dysphagia 1)  AMBULATORY STATUS COMMUNICATION OF NEEDS Skin   Extensive Assist Verbally Normal                       Personal Care Assistance Level of Assistance  Bathing, Dressing Bathing Assistance: Maximum assistance   Dressing Assistance: Maximum assistance     Functional Limitations Info             SPECIAL CARE FACTORS FREQUENCY  PT (By licensed PT)                    Contractures Contractures Info: Not present    Additional Factors Info  Code Status, Allergies Code Status Info: DNR Allergies Info: codeine;pcns           Current Medications (11/02/2016):  This is the current hospital active medication list Current Facility-Administered Medications  Medication Dose Route Frequency Provider Last Rate Last Dose  . acetaminophen (TYLENOL) tablet 650 mg  650 mg Oral Q6H PRN Oralia Manis, MD   650 mg at 11/01/16 2104   Or  . acetaminophen (TYLENOL) suppository 650 mg  650 mg Rectal Q6H PRN Oralia Manis, MD      . ARIPiprazole (ABILIFY) tablet 2 mg  2 mg Oral Daily Audery Amel, MD   2 mg at 11/02/16 1005  . baclofen (LIORESAL) tablet 10 mg  10 mg Oral TID Oralia Manis, MD   10 mg at 11/02/16 1005  . cefTRIAXone (ROCEPHIN) IVPB 1 g  1 g Intravenous Q24H Auburn Bilberry, MD  1 g at 11/02/16 1006  . citalopram (CELEXA) tablet 30 mg  30 mg Oral Daily Audery AmelJohn T Clapacs, MD   30 mg at 11/02/16 1005  . docusate sodium (COLACE) capsule 200 mg  200 mg Oral BID Auburn BilberryShreyang Patel, MD   200 mg at 11/02/16 1005  . heparin injection 5,000 Units  5,000 Units Subcutaneous Q8H Oralia Manisavid Willis, MD   5,000 Units at 11/02/16 0540  . HYDROcodone-acetaminophen (NORCO) 10-325 MG per tablet 1 tablet  1 tablet Oral Q6H PRN Oralia Manisavid Willis, MD   1 tablet at 11/01/16 2105  . ipratropium-albuterol (DUONEB) 0.5-2.5 (3) MG/3ML nebulizer solution 3 mL  3 mL Nebulization Q6H PRN Auburn BilberryShreyang Patel, MD   3 mL at 11/02/16 0540  . levothyroxine (SYNTHROID, LEVOTHROID) tablet  75 mcg  75 mcg Oral QAC breakfast Oralia Manisavid Willis, MD   75 mcg at 11/02/16 1005  . LORazepam (ATIVAN) injection 1 mg  1 mg Intravenous Q6H PRN Auburn BilberryShreyang Patel, MD      . mirtazapine (REMERON) tablet 7.5 mg  7.5 mg Oral QHS Audery AmelJohn T Clapacs, MD   7.5 mg at 11/01/16 2105  . morphine 4 MG/ML injection 2 mg  2 mg Intravenous Q3H PRN Auburn BilberryShreyang Patel, MD   2 mg at 11/02/16 0052  . ondansetron (ZOFRAN) tablet 4 mg  4 mg Oral Q6H PRN Oralia Manisavid Willis, MD       Or  . ondansetron [facility](ZOFRAN) injection 4 mg  4 mg Intravenous Q6H PRN Oralia Manisavid Willis, MD   4 mg at 10/26/16 1948  . pantoprazole (PROTONIX) EC tablet 40 mg  40 mg Oral BID AC Oralia Manisavid Willis, MD   40 mg at 11/02/16 1005  . pregabalin (LYRICA) capsule 25 mg  25 mg Oral Daily Alford Highlandichard Wieting, MD   25 mg at 11/02/16 1006  . tamsulosin (FLOMAX) capsule 0.4 mg  0.4 mg Oral Daily Altamese DillingVaibhavkumar Vachhani, MD   0.4 mg at 11/02/16 1005     Discharge Medications: Please see discharge summary for a list of discharge medications.  Relevant Imaging Results:  Relevant Lab Results:   Additional Information ss: [id number]  York SpanielMonica Wilberta Dorvil, LCSW

## 2016-11-02 NOTE — Progress Notes (Signed)
Patient ID: Misty Medina, female   DOB: 06-14-1941, 76 y.o.   MRN: [id number]  [employer] PROGRESS NOTE  Misty Medina YQM:[id number] DOB: 07-16-1941 DOA: 10/25/2016 PCP: Luna Fuse, MD  HPI/Subjective:  Patient complains of being weak today with a temperature yesterday and noticed to be febrile and had a UTI    Objective: Vitals:   11/02/16 0453 11/02/16 1310  BP: 111/66 (!) 113/57  Pulse: 88 89  Resp: (!) 22   Temp: 100 F (37.8 C) 99.2 F (37.3 C)    Filed Weights   10/25/16 2050 10/26/16 0047 10/26/16 0715  Weight: 155 lb (70.3 kg) 164 lb 1.6 oz (74.4 kg) 170 lb 3.1 oz (77.2 kg)    ROS: Review of Systems  Constitutional: Negative for chills and fever.  Eyes: Negative for blurred vision.  Respiratory: Positive for shortness of breath. Negative for cough.   Cardiovascular: Negative for chest pain.  Gastrointestinal: Negative for abdominal pain, constipation, diarrhea, nausea and vomiting.  Genitourinary: Negative for dysuria.  Musculoskeletal: Negative for joint pain.  Neurological: Positive for weakness. Negative for dizziness, sensory change and headaches.   Exam: Physical Exam  HENT:  Nose: No mucosal edema.  Mouth/Throat: No oropharyngeal exudate or posterior oropharyngeal edema.  Eyes: Conjunctivae, EOM and lids are normal. Pupils are equal, round, and reactive to light.  Neck: No JVD present. Carotid bruit is not present. No edema present. No thyroid mass and no thyromegaly present.  Cardiovascular: S1 normal and S2 normal.  Exam reveals no gallop.   No murmur heard. Pulses:      Dorsalis pedis pulses are 2+ on the right side, and 2+ on the left side.  Respiratory: No respiratory distress. She has no decreased breath sounds. She has no wheezes. She has no rhonchi. She has no rales.  GI: Soft. Bowel sounds are normal. There is no tenderness.  Musculoskeletal:       Right ankle: She exhibits swelling.       Left ankle: She exhibits  swelling.  Lymphadenopathy:    She has no cervical adenopathy.  Neurological: She is alert.  Able to wiggle toes and since that on touching her feet. Able to lift both arms up off the bed.  Skin: Skin is warm. No rash noted. Nails show no clubbing.  Psychiatric: Her mood appears anxious. She is slowed.      Data Reviewed: Basic Metabolic Panel:  Recent Labs Lab 10/28/16 0549 10/29/16 1040 10/30/16 0617 10/31/16 0432 11/01/16 0522  NA 137 139 138 140 144  K 4.3 4.7 4.3 4.8 4.0  CL 111 114* 111 116* 113*  CO2 19* 20* 20* 16* 24  GLUCOSE 86 120* 90 71 104*  BUN 48* 58* 63* 65* 53*  CREATININE 1.99* 2.57* 2.78* 2.82* 1.83*  CALCIUM 8.5* 8.4* 8.2* 8.0* 8.3*   Liver Function Tests:  Recent Labs Lab 10/27/16 0354  AST 23  ALT 19  ALKPHOS 125  BILITOT 0.7  PROT 6.4*  ALBUMIN 3.2*   No results for input(s): LIPASE, AMYLASE in the last 168 hours. CBC:  Recent Labs Lab 10/27/16 0354 10/28/16 0549 11/01/16 0522  WBC 13.8* 11.1* 6.4  HGB 10.7* 10.0* 9.8*  HCT 31.0* 29.7* 28.9*  MCV 90.4 91.6 91.4  PLT 255 209 190   Cardiac Enzymes:  Recent Labs Lab 10/26/16 2150 10/27/16 0354  TROPONINI <0.03 <0.03     Recent Results (from the past 240 hour(s))  MRSA PCR Screening     Status: None  Collection Time: 10/26/16  7:17 AM  Result Value Ref Range Status   MRSA by PCR NEGATIVE NEGATIVE Final    Comment:        The GeneXpert MRSA Assay (FDA approved for NASAL specimens only), is one component of a comprehensive MRSA colonization surveillance program. It is not intended to diagnose MRSA infection nor to guide or monitor treatment for MRSA infections.   Culture, blood (Routine X 2) w Reflex to ID Panel     Status: None   Collection Time: 10/26/16  4:10 PM  Result Value Ref Range Status   Specimen Description BLOOD RIGHT ARM  Final   Special Requests   Final    BOTTLES DRAWN AEROBIC AND ANAEROBIC 10MLAERO,8MLANA   Culture NO GROWTH 5 DAYS  Final    Report Status 10/31/2016 FINAL  Final  Culture, blood (Routine X 2) w Reflex to ID Panel     Status: None   Collection Time: 10/26/16  4:10 PM  Result Value Ref Range Status   Specimen Description BLOOD LEFT HAND  Final   Special Requests BOTTLES DRAWN AEROBIC AND ANAEROBIC 6MLAERO,6MLANA  Final   Culture NO GROWTH 5 DAYS  Final   Report Status 10/31/2016 FINAL  Final  CULTURE, BLOOD (ROUTINE X 2) w Reflex to ID Panel     Status: None (Preliminary result)   Collection Time: 11/01/16  9:40 PM  Result Value Ref Range Status   Specimen Description BLOOD  R ARM  Final   Special Requests   Final    BOTTLES DRAWN AEROBIC AND ANAEROBIC  AER 3 ML ANA 2 ML   Culture NO GROWTH < 12 HOURS  Final   Report Status PENDING  Incomplete  CULTURE, BLOOD (ROUTINE X 2) w Reflex to ID Panel     Status: None (Preliminary result)   Collection Time: 11/02/16 12:35 AM  Result Value Ref Range Status   Specimen Description BLOOD  R AC  Final   Special Requests   Final    BOTTLES DRAWN AEROBIC AND ANAEROBIC  AER 3 ML ANA 4 ML   Culture NO GROWTH < 12 HOURS  Final   Report Status PENDING  Incomplete     Studies: Dg Chest 1 View  Result Date: 11/01/2016 CLINICAL DATA:  Initial evaluation for acute shortness of breath. EXAM: CHEST 1 VIEW COMPARISON:  Prior radiograph from 10/26/2016. FINDINGS: Moderate cardiomegaly, stable. Mediastinal silhouette within normal limits. Aortic atherosclerosis noted. Lungs hypoinflated. No focal infiltrates identified. No pulmonary edema or pleural effusion. No pneumothorax. No acute osseous abnormality. IMPRESSION: 1. Shallow lung inflation with no active cardiopulmonary disease identified. 2. Stable cardiomegaly. 3. Aortic atherosclerosis. Electronically Signed   By: Rise MuBenjamin  McClintock M.D.   On: 11/01/2016 22:19    Scheduled Meds: . ARIPiprazole  2 mg Oral Daily  . baclofen  10 mg Oral TID  . cefTRIAXone  1 g Intravenous Q24H  . citalopram  30 mg Oral Daily  . docusate sodium   200 mg Oral BID  . heparin  5,000 Units Subcutaneous Q8H  . levothyroxine  75 mcg Oral QAC breakfast  . mirtazapine  7.5 mg Oral QHS  . pantoprazole  40 mg Oral BID AC  . pregabalin  25 mg Oral Daily  . tamsulosin  0.4 mg Oral Daily   Continuous Infusions:   Assessment/Plan:   1. Acute kidney injury on chronic kidney disease stage III. Seen by nephrology and renal function is improved continue to monitor 2. Fever due to UTI  patient started on IV ceftriaxone will obtain urine cultures 3. Acute hypoxic respiratory failure with Asthma exacerbation. Continue nebulizer treatments. Continue oxygen as needed chest x-ray shows no pneumonia 4. Diarrhea. Resolved  5. Hypothyroidism unspecified on levothyroxine 6. GERD and recent gastric ulcer on endoscopy on Protonix 7. Restless leg syndrome, Mirapex discontinued 8. Fibromyalgia on Lyrica (decrease dose) and baclofen 9. Depression continue psychiatric medication.seen by pychiatry    Code Status:     Code Status Orders        Start     Ordered   10/26/16 0026  Do not attempt resuscitation (DNR)  Continuous    Question Answer Comment  In the event of cardiac or respiratory ARREST Do not call a "code blue"   In the event of cardiac or respiratory ARREST Do not perform Intubation, CPR, defibrillation or ACLS   In the event of cardiac or respiratory ARREST Use medication by any route, position, wound care, and other measures to relive pain and suffering. May use oxygen, suction and manual treatment of airway obstruction as needed for comfort.      10/26/16 0025    Code Status History    Date Active Date Inactive Code Status Order ID Comments User Context   08/28/2016  1:22 PM 09/03/2016  3:21 AM Full Code [id number]  Shaune Pollack, MD Inpatient   09/14/2015 10:36 PM 09/16/2015  9:58 PM Full Code [id number]  Oralia Manis, MD Inpatient    Advance Directive Documentation   Flowsheet Row Most Recent Value  Type of Advance Directive   Healthcare Power of Attorney, Living will  Pre-existing out of facility DNR order (yellow form or pink MOST form)  No data  "MOST" Form in Place?  No data     Disposition Plan: Physical therapy recommended rehabilitation  Consultants:  Critical care specialist  Time spent: 25 minutes.   Allena Katz [facility]  [employer]

## 2016-11-02 NOTE — Progress Notes (Signed)
Physical Therapy Treatment Patient Details Name: Misty Medina MRN: [id number] DOB: 05-03-1941 Today's Date: 11/02/2016    History of Present Illness Pt admitted for acute on chronic renal failure. Pt complains of weakness from N/V. History includes depression, fibromyalgia, and GERD. Pt has been evaluated by psych and palliative care.    PT Comments    Pt is making limited progress towards goals with improved ability to participate this date. Pt agreeable to there-ex, however once seated at EOB, unable to perform further mobility secondary to poor balance and strength deficits. Pt agreeable to continue to attempt physical therapy. Gave written HEP and reviewed with patient. Educated to continue to perform twice a day. Will continue to progress.  Follow Up Recommendations  SNF     Equipment Recommendations       Recommendations for Other Services       Precautions / Restrictions Precautions Precautions: Fall Restrictions Weight Bearing Restrictions: No    Mobility  Bed Mobility Overal bed mobility: Needs Assistance Bed Mobility: Supine to Sit     Supine to sit: Max assist     General bed mobility comments: assist for initiation and sliding B LE off bed. Once seated at EOB, pt able to sit with upright posture, however fatigues quickly with LOB in post lateral position, only able to sit x 1 minute before heavy fatigue. Unable to progress further OOB mobility this date.  Transfers                 General transfer comment: not performed secondary to weakness/balance  Ambulation/Gait                 Stairs            Wheelchair Mobility    Modified Rankin (Stroke Patients Only)       Balance                                    Cognition Arousal/Alertness: Awake/alert Behavior During Therapy: Flat affect Overall Cognitive Status: Within Functional Limits for tasks assessed                      Exercises Other  Exercises Other Exercises: Supine ther-ex performed including B LE including ankle pumps, quad sets, SLRs, SAQ, hip abd/add, and hip add squeezes. All ther-ex performed x 10 reps with cues for correct technique and min/mod assist.     General Comments        Pertinent Vitals/Pain Pain Assessment: No/denies pain    Home Living                      Prior Function            PT Goals (current goals can now be found in the care plan section) Acute Rehab PT Goals Patient Stated Goal: to try to get sitting at EOB PT Goal Formulation: With patient Time For Goal Achievement: 11/10/16 Potential to Achieve Goals: Fair Progress towards PT goals: Progressing toward goals    Frequency    Min 2X/week      PT Plan Current plan remains appropriate    Co-evaluation             End of Session Equipment Utilized During Treatment: Oxygen Activity Tolerance: Patient tolerated treatment well Patient left: in bed;with bed alarm set     Time: 4098-1191 PT Time Calculation (  min) (ACUTE ONLY): 16 min  Charges:  $Therapeutic Exercise: 8-22 mins                    G Codes:      Laquan Beier 11/02/2016, 12:48 PM  Elizabeth PalauStephanie Doretta Remmert, PT, DPT [phone]

## 2016-11-02 NOTE — Progress Notes (Signed)
Subjective:   States she feels Better today C/o generalized weakness Ate a little S Creatinine has improved to 1.83 (baseline)  Objective:  Vital signs in last 24 hours:  Temp:  [99.6 F (37.6 C)-101.6 F (38.7 C)] 100 F (37.8 C) (01/04 0453) Pulse Rate:  [88-94] 88 (01/04 0453) Resp:  [18-22] 22 (01/04 0453) BP: (104-123)/(58-68) 111/66 (01/04 0453) SpO2:  [97 %-99 %] 99 % (01/04 0453)  Weight change:  Filed Weights   10/25/16 2050 10/26/16 0047 10/26/16 0715  Weight: 70.3 kg (155 lb) 74.4 kg (164 lb 1.6 oz) 77.2 kg (170 lb 3.1 oz)    Intake/Output:    Intake/Output Summary (Last 24 hours) at 11/02/16 1302 Last data filed at 11/02/16 1017  Gross per 24 hour  Intake              238 ml  Output              600 ml  Net             -362 ml     Physical Exam: General: No acute distress, laying in the bed   HEENT Anicteric, moist oral mucous membranes   Neck Supple   Pulm/lungs Mild Bibasilar crackles   CVS/Heart No rub or gallop   Abdomen:  Soft, mildly distended   Extremities: Generalized dependent pitting edema   Neurologic: Alert, able to answer questions   Skin: No acute rashes           Basic Metabolic Panel:   Recent Labs Lab 10/28/16 0549 10/29/16 1040 10/30/16 0617 10/31/16 0432 11/01/16 0522  NA 137 139 138 140 144  K 4.3 4.7 4.3 4.8 4.0  CL 111 114* 111 116* 113*  CO2 19* 20* 20* 16* 24  GLUCOSE 86 120* 90 71 104*  BUN 48* 58* 63* 65* 53*  CREATININE 1.99* 2.57* 2.78* 2.82* 1.83*  CALCIUM 8.5* 8.4* 8.2* 8.0* 8.3*     CBC:  Recent Labs Lab 10/27/16 0354 10/28/16 0549 11/01/16 0522  WBC 13.8* 11.1* 6.4  HGB 10.7* 10.0* 9.8*  HCT 31.0* 29.7* 28.9*  MCV 90.4 91.6 91.4  PLT 255 209 190      Microbiology:  Recent Results (from the past 720 hour(s))  MRSA PCR Screening     Status: None   Collection Time: 10/26/16  7:17 AM  Result Value Ref Range Status   MRSA by PCR NEGATIVE NEGATIVE Final    Comment:        The  GeneXpert MRSA Assay (FDA approved for NASAL specimens only), is one component of a comprehensive MRSA colonization surveillance program. It is not intended to diagnose MRSA infection nor to guide or monitor treatment for MRSA infections.   Culture, blood (Routine X 2) w Reflex to ID Panel     Status: None   Collection Time: 10/26/16  4:10 PM  Result Value Ref Range Status   Specimen Description BLOOD RIGHT ARM  Final   Special Requests   Final    BOTTLES DRAWN AEROBIC AND ANAEROBIC 10MLAERO,8MLANA   Culture NO GROWTH 5 DAYS  Final   Report Status 10/31/2016 FINAL  Final  Culture, blood (Routine X 2) w Reflex to ID Panel     Status: None   Collection Time: 10/26/16  4:10 PM  Result Value Ref Range Status   Specimen Description BLOOD LEFT HAND  Final   Special Requests BOTTLES DRAWN AEROBIC AND ANAEROBIC 6MLAERO,6MLANA  Final   Culture NO GROWTH 5 DAYS  Final   Report Status 10/31/2016 FINAL  Final  CULTURE, BLOOD (ROUTINE X 2) w Reflex to ID Panel     Status: None (Preliminary result)   Collection Time: 11/01/16  9:40 PM  Result Value Ref Range Status   Specimen Description BLOOD  R ARM  Final   Special Requests   Final    BOTTLES DRAWN AEROBIC AND ANAEROBIC  AER 3 ML ANA 2 ML   Culture NO GROWTH < 12 HOURS  Final   Report Status PENDING  Incomplete  CULTURE, BLOOD (ROUTINE X 2) w Reflex to ID Panel     Status: None (Preliminary result)   Collection Time: 11/02/16 12:35 AM  Result Value Ref Range Status   Specimen Description BLOOD  R AC  Final   Special Requests   Final    BOTTLES DRAWN AEROBIC AND ANAEROBIC  AER 3 ML ANA 4 ML   Culture NO GROWTH < 12 HOURS  Final   Report Status PENDING  Incomplete    Coagulation Studies: No results for input(s): LABPROT, INR in the last 72 hours.  Urinalysis:  Recent Labs  11/02/16 0510  COLORURINE YELLOW*  LABSPEC 1.012  PHURINE 5.0  GLUCOSEU NEGATIVE  HGBUR SMALL*  BILIRUBINUR NEGATIVE  KETONESUR NEGATIVE  PROTEINUR  NEGATIVE  NITRITE NEGATIVE  LEUKOCYTESUR LARGE*      Imaging: Dg Chest 1 View  Result Date: 11/01/2016 CLINICAL DATA:  Initial evaluation for acute shortness of breath. EXAM: CHEST 1 VIEW COMPARISON:  Prior radiograph from 10/26/2016. FINDINGS: Moderate cardiomegaly, stable. Mediastinal silhouette within normal limits. Aortic atherosclerosis noted. Lungs hypoinflated. No focal infiltrates identified. No pulmonary edema or pleural effusion. No pneumothorax. No acute osseous abnormality. IMPRESSION: 1. Shallow lung inflation with no active cardiopulmonary disease identified. 2. Stable cardiomegaly. 3. Aortic atherosclerosis. Electronically Signed   By: Rise Mu M.D.   On: 11/01/2016 22:19     Medications:    . ARIPiprazole  2 mg Oral Daily  . baclofen  10 mg Oral TID  . cefTRIAXone  1 g Intravenous Q24H  . citalopram  30 mg Oral Daily  . docusate sodium  200 mg Oral BID  . heparin  5,000 Units Subcutaneous Q8H  . levothyroxine  75 mcg Oral QAC breakfast  . mirtazapine  7.5 mg Oral QHS  . pantoprazole  40 mg Oral BID AC  . pregabalin  25 mg Oral Daily  . tamsulosin  0.4 mg Oral Daily   acetaminophen **OR** acetaminophen, HYDROcodone-acetaminophen, ipratropium-albuterol, LORazepam, morphine injection, ondansetron **OR** ondansetron (ZOFRAN) IV  Assessment/ Plan:  76 y.o. Caucasian female with hypertension, chronic sinusitis, chronic lower back pain, aortic regurgitation, collagenous colitis, hypothyroidism, depression, anemia, hyperlipidemia admitted on 10/25/2016 for dehydration and acute renal failure  1. Acute renal failure on chronic kidney disease stage IV 2. Hyperkalemia 3. Generalized edema 4. UTI, fever  Baseline creatinine is 1.81/GFR 27 from 09/28/2016 Serum creatinine has improved to baseline Due to generalized edema, avoid IV fluid supplementation Encourage oral intake Iv rocephin for UTI We'll follow   LOS: 7 Dream Harman 1/4/20181:02 PM

## 2016-11-03 LAB — BLOOD CULTURE ID PANEL (REFLEXED)
ACINETOBACTER BAUMANNII: NOT DETECTED
CANDIDA PARAPSILOSIS: NOT DETECTED
Candida albicans: NOT DETECTED
Candida glabrata: NOT DETECTED
Candida krusei: NOT DETECTED
Candida tropicalis: NOT DETECTED
ENTEROCOCCUS SPECIES: NOT DETECTED
Enterobacter cloacae complex: NOT DETECTED
Enterobacteriaceae species: NOT DETECTED
Escherichia coli: NOT DETECTED
HAEMOPHILUS INFLUENZAE: NOT DETECTED
KLEBSIELLA OXYTOCA: NOT DETECTED
Klebsiella pneumoniae: NOT DETECTED
Listeria monocytogenes: NOT DETECTED
METHICILLIN RESISTANCE: DETECTED — AB
Neisseria meningitidis: NOT DETECTED
PROTEUS SPECIES: NOT DETECTED
PSEUDOMONAS AERUGINOSA: NOT DETECTED
SERRATIA MARCESCENS: NOT DETECTED
STREPTOCOCCUS PNEUMONIAE: NOT DETECTED
Staphylococcus aureus (BCID): NOT DETECTED
Staphylococcus species: DETECTED — AB
Streptococcus agalactiae: NOT DETECTED
Streptococcus pyogenes: NOT DETECTED
Streptococcus species: NOT DETECTED

## 2016-11-03 MED ORDER — TAMSULOSIN HCL 0.4 MG PO CAPS: 0.4000 mg | ORAL_CAPSULE | Freq: Every day | ORAL | Status: DC

## 2016-11-03 MED ORDER — VANCOMYCIN HCL 10 G IV SOLR
1250.0000 mg | Freq: Once | INTRAVENOUS | Status: AC
  Administered 2016-11-03: 1250 mg via INTRAVENOUS
  Filled 2016-11-03: qty 1250

## 2016-11-03 NOTE — Progress Notes (Addendum)
RN bladder scanned pt and received 378 mL, pt is voiding in depends. MD notified of findings. New orders to bladder scan pt every shift and if bladder scanner shows >400 then in an out cath pt. Will continue to monitor pt.   [place]on Garden [employer] [place]

## 2016-11-03 NOTE — Progress Notes (Signed)
PHARMACY - PHYSICIAN COMMUNICATION CRITICAL VALUE ALERT - BLOOD CULTURE IDENTIFICATION (BCID)  No results found for this or any previous visit.  Name of physician (or Provider) Contacted: Pyreddy  Changes to prescribed antibiotics required: give 1 dose of vancomycin and wait for complete culture results.  Carola FrostNathan A Lucillia Corson, Pharm.D., BCPS Clinical Pharmacist 11/03/2016  3:24 AM

## 2016-11-03 NOTE — Clinical Social Work Note (Signed)
Patient's pasrr has gone to a level 2 and thus will not be able to transfer to a SNF until this has been received. York SpanielMonica Egbert Seidel MSW,LCSW [phone]

## 2016-11-03 NOTE — Clinical Social Work Note (Signed)
CSW has extended bed offers to patient and she has chosen [facility]. CSW has notified [facility] and once we receive patient's level 2 pasrr, we will then be able to facilitate a discharge. York SpanielMonica Mannix Kroeker MSW,LCSW [phone]

## 2016-11-03 NOTE — Care Management Important Message (Signed)
Important Message  Patient Details  Name: Misty Medina MRN: [id number] Date of Birth: 08/11/1941   Medicare Important Message Given:  Yes    Chapman FitchBOWEN, Analyce Tavares T, RN 11/03/2016, 12:55 PM

## 2016-11-03 NOTE — Progress Notes (Signed)
Patient ID: Misty Medina, female   DOB: 1941-07-23, 76 y.o.   MRN: [id number]  [facility] PROGRESS NOTE  EBBIE SORENSON WUJ:[id number] DOB: 1941/03/13 DOA: 10/25/2016 PCP: Luna Fuse, MD  HPI/Subjective:  Patient finally stating today that she's feeling better. She has a low-grade temperature   Objective: Vitals:   11/03/16 0416 11/03/16 1527  BP: (!) 135/50 127/61  Pulse: 86 93  Resp: (!) 22   Temp: 100.1 F (37.8 C) 98.1 F (36.7 C)    Filed Weights   10/25/16 2050 10/26/16 0047 10/26/16 0715  Weight: 155 lb (70.3 kg) 164 lb 1.6 oz (74.4 kg) 170 lb 3.1 oz (77.2 kg)    ROS: Review of Systems  Constitutional: Negative for chills and fever.  Eyes: Negative for blurred vision.  Respiratory: Positive for shortness of breath. Negative for cough.   Cardiovascular: Negative for chest pain.  Gastrointestinal: Negative for abdominal pain, constipation, diarrhea, nausea and vomiting.  Genitourinary: Negative for dysuria.  Musculoskeletal: Negative for joint pain.  Neurological: Positive for weakness. Negative for dizziness, sensory change and headaches.   Exam: Physical Exam  HENT:  Nose: No mucosal edema.  Mouth/Throat: No oropharyngeal exudate or posterior oropharyngeal edema.  Eyes: Conjunctivae, EOM and lids are normal. Pupils are equal, round, and reactive to light.  Neck: No JVD present. Carotid bruit is not present. No edema present. No thyroid mass and no thyromegaly present.  Cardiovascular: S1 normal and S2 normal.  Exam reveals no gallop.   No murmur heard. Pulses:      Dorsalis pedis pulses are 2+ on the right side, and 2+ on the left side.  Respiratory: No respiratory distress. She has no decreased breath sounds. She has no wheezes. She has no rhonchi. She has no rales.  GI: Soft. Bowel sounds are normal. There is no tenderness.  Musculoskeletal:       Right ankle: She exhibits swelling.       Left ankle: She exhibits swelling.   Lymphadenopathy:    She has no cervical adenopathy.  Neurological: She is alert.  Able to wiggle toes and since that on touching her feet. Able to lift both arms up off the bed.  Skin: Skin is warm. No rash noted. Nails show no clubbing.  Psychiatric: Her mood appears anxious. She is slowed.      Data Reviewed: Basic Metabolic Panel:  Recent Labs Lab 10/28/16 0549 10/29/16 1040 10/30/16 0617 10/31/16 0432 11/01/16 0522  NA 137 139 138 140 144  K 4.3 4.7 4.3 4.8 4.0  CL 111 114* 111 116* 113*  CO2 19* 20* 20* 16* 24  GLUCOSE 86 120* 90 71 104*  BUN 48* 58* 63* 65* 53*  CREATININE 1.99* 2.57* 2.78* 2.82* 1.83*  CALCIUM 8.5* 8.4* 8.2* 8.0* 8.3*   Liver Function Tests: No results for input(s): AST, ALT, ALKPHOS, BILITOT, PROT, ALBUMIN in the last 168 hours. No results for input(s): LIPASE, AMYLASE in the last 168 hours. CBC:  Recent Labs Lab 10/28/16 0549 11/01/16 0522  WBC 11.1* 6.4  HGB 10.0* 9.8*  HCT 29.7* 28.9*  MCV 91.6 91.4  PLT 209 190   Cardiac Enzymes: No results for input(s): CKTOTAL, CKMB, CKMBINDEX, TROPONINI in the last 168 hours.   Recent Results (from the past 240 hour(s))  MRSA PCR Screening     Status: None   Collection Time: 10/26/16  7:17 AM  Result Value Ref Range Status   MRSA by PCR NEGATIVE NEGATIVE Final    Comment:  The GeneXpert MRSA Assay (FDA approved for NASAL specimens only), is one component of a comprehensive MRSA colonization surveillance program. It is not intended to diagnose MRSA infection nor to guide or monitor treatment for MRSA infections.   Culture, blood (Routine X 2) w Reflex to ID Panel     Status: None   Collection Time: 10/26/16  4:10 PM  Result Value Ref Range Status   Specimen Description BLOOD RIGHT ARM  Final   Special Requests   Final    BOTTLES DRAWN AEROBIC AND ANAEROBIC 10MLAERO,8MLANA   Culture NO GROWTH 5 DAYS  Final   Report Status 10/31/2016 FINAL  Final  Culture, blood (Routine X 2)  w Reflex to ID Panel     Status: None   Collection Time: 10/26/16  4:10 PM  Result Value Ref Range Status   Specimen Description BLOOD LEFT HAND  Final   Special Requests BOTTLES DRAWN AEROBIC AND ANAEROBIC 6MLAERO,6MLANA  Final   Culture NO GROWTH 5 DAYS  Final   Report Status 10/31/2016 FINAL  Final  CULTURE, BLOOD (ROUTINE X 2) w Reflex to ID Panel     Status: None (Preliminary result)   Collection Time: 11/01/16  9:40 PM  Result Value Ref Range Status   Specimen Description BLOOD  R ARM  Final   Special Requests   Final    BOTTLES DRAWN AEROBIC AND ANAEROBIC  AER 3 ML ANA 2 ML   Culture NO GROWTH 2 DAYS  Final   Report Status PENDING  Incomplete  CULTURE, BLOOD (ROUTINE X 2) w Reflex to ID Panel     Status: None (Preliminary result)   Collection Time: 11/02/16 12:35 AM  Result Value Ref Range Status   Specimen Description BLOOD  R AC  Final   Special Requests   Final    BOTTLES DRAWN AEROBIC AND ANAEROBIC  AER 3 ML ANA 4 ML   Culture  Setup Time Organism ID to follow AEROBIC BOTTLE ONLY   Final   Culture NO GROWTH 1 DAY  Final   Report Status PENDING  Incomplete  Blood Culture ID Panel (Reflexed)     Status: Abnormal   Collection Time: 11/02/16 12:35 AM  Result Value Ref Range Status   Enterococcus species NOT DETECTED NOT DETECTED Final   Listeria monocytogenes NOT DETECTED NOT DETECTED Final   Staphylococcus species DETECTED (A) NOT DETECTED Final    Comment: CRITICAL RESULT CALLED TO, READ BACK BY AND VERIFIED WITH: NATE COOKSON ON 11/03/16 AT 0315 BY TLB    Staphylococcus aureus NOT DETECTED NOT DETECTED Final   Methicillin resistance DETECTED (A) NOT DETECTED Final    Comment: CRITICAL RESULT CALLED TO, READ BACK BY AND VERIFIED WITH: NATE COOKSON ON 11/03/16 AT 0315 BY TLB    Streptococcus species NOT DETECTED NOT DETECTED Final   Streptococcus agalactiae NOT DETECTED NOT DETECTED Final   Streptococcus pneumoniae NOT DETECTED NOT DETECTED Final   Streptococcus  pyogenes NOT DETECTED NOT DETECTED Final   Acinetobacter baumannii NOT DETECTED NOT DETECTED Final   Enterobacteriaceae species NOT DETECTED NOT DETECTED Final   Enterobacter cloacae complex NOT DETECTED NOT DETECTED Final   Escherichia coli NOT DETECTED NOT DETECTED Final   Klebsiella oxytoca NOT DETECTED NOT DETECTED Final   Klebsiella pneumoniae NOT DETECTED NOT DETECTED Final   Proteus species NOT DETECTED NOT DETECTED Final   Serratia marcescens NOT DETECTED NOT DETECTED Final   Haemophilus influenzae NOT DETECTED NOT DETECTED Final   Neisseria meningitidis NOT DETECTED NOT  DETECTED Final   Pseudomonas aeruginosa NOT DETECTED NOT DETECTED Final   Candida albicans NOT DETECTED NOT DETECTED Final   Candida glabrata NOT DETECTED NOT DETECTED Final   Candida krusei NOT DETECTED NOT DETECTED Final   Candida parapsilosis NOT DETECTED NOT DETECTED Final   Candida tropicalis NOT DETECTED NOT DETECTED Final     Studies: Dg Chest 1 View  Result Date: 11/01/2016 CLINICAL DATA:  Initial evaluation for acute shortness of breath. EXAM: CHEST 1 VIEW COMPARISON:  Prior radiograph from 10/26/2016. FINDINGS: Moderate cardiomegaly, stable. Mediastinal silhouette within normal limits. Aortic atherosclerosis noted. Lungs hypoinflated. No focal infiltrates identified. No pulmonary edema or pleural effusion. No pneumothorax. No acute osseous abnormality. IMPRESSION: 1. Shallow lung inflation with no active cardiopulmonary disease identified. 2. Stable cardiomegaly. 3. Aortic atherosclerosis. Electronically Signed   By: Rise Mu M.D.   On: 11/01/2016 22:19    Scheduled Meds: . ARIPiprazole  2 mg Oral Daily  . baclofen  10 mg Oral TID  . cefTRIAXone  1 g Intravenous Q24H  . citalopram  30 mg Oral Daily  . docusate sodium  200 mg Oral BID  . heparin  5,000 Units Subcutaneous Q8H  . levothyroxine  75 mcg Oral QAC breakfast  . mirtazapine  7.5 mg Oral QHS  . pantoprazole  40 mg Oral BID AC  .  pregabalin  25 mg Oral Daily  . tamsulosin  0.4 mg Oral Daily   Continuous Infusions:   Assessment/Plan:   1. Acute kidney injury on chronic kidney disease stage III. Seen by nephrology and renal function is improved continue to monitorWill repeat BMP in the morning 2. Fever due to UTI patient started on IV ceftriaxone  await urine cultures 3. Acute hypoxic respiratory failure with Asthma exacerbation. Continue nebulizer treatments. Continue oxygen as needed chest x-ray shows no pneumonia 4. Diarrhea. Resolved  5. Hypothyroidism unspecified on levothyroxine 6. GERD and recent gastric ulcer on endoscopy on Protonix 7. Restless leg syndrome, Mirapex discontinued 8. Fibromyalgia on Lyrica (decrease dose) and baclofen 9. Depression continue psychiatric medication.seen by pychiatry  To rehabilitation once patient stable with her temperature   Code Status:     Code Status Orders        Start     Ordered   10/26/16 0026  Do not attempt resuscitation (DNR)  Continuous    Question Answer Comment  In the event of cardiac or respiratory ARREST Do not call a "code blue"   In the event of cardiac or respiratory ARREST Do not perform Intubation, CPR, defibrillation or ACLS   In the event of cardiac or respiratory ARREST Use medication by any route, position, wound care, and other measures to relive pain and suffering. May use oxygen, suction and manual treatment of airway obstruction as needed for comfort.      10/26/16 0025    Code Status History    Date Active Date Inactive Code Status Order ID Comments User Context   08/28/2016  1:22 PM 09/03/2016  3:21 AM Full Code [id number]  Shaune Pollack, MD Inpatient   09/14/2015 10:36 PM 09/16/2015  9:58 PM Full Code [id number]  Oralia Manis, MD Inpatient    Advance Directive Documentation   Flowsheet Row Most Recent Value  Type of Advance Directive  Healthcare Power of Attorney, Living will  Pre-existing out of facility DNR order (yellow form or  pink MOST form)  No data  "MOST" Form in Place?  No data     Disposition Plan: Physical therapy recommended  rehabilitation  Consultants:  Critical care specialist  Time spent: 25 minutes.   Allena KatzPATEL, [facility] Center[facility]REYANG  [facility]

## 2016-11-04 NOTE — Progress Notes (Signed)
Pt bladder scanned as per md"s orders 533. Pt refused to be I and out cath'd unless it can be left in. Md paged.

## 2016-11-04 NOTE — Progress Notes (Signed)
Speech Therapy Note: reviewed chart notes; consulted NSG re: pt's status. Per NSG, pt is beginning to accept a few more po bites and sips when being fed. Added a few specific items to pureed diet to help encourage oral intake. Discussed w/ NSG who agreed. ST services will f/u w/ pt's status next week w/ attempts to upgrade as able.    Jerilynn SomKatherine Jacky Hartung, MS, CCC-SLP

## 2016-11-04 NOTE — Progress Notes (Signed)
PT Cancellation Note  Patient Details Name: Misty Medina MRN: [id number] DOB: 09/17/1941   Cancelled Treatment:    Reason Eval/Treat Not Completed: Patient declined, no reason specified. Treatment attempted; pt alert and communicative. Pt offered PT with encouragement to participate; pt thinks, but ultimately says no. Despite explanation that pt has not been seen for a while, pt says "she just doesn't feel like it". Re attempt treatment next week.    Scot DockHeidi E Emeri Estill, PTA 11/04/2016, 4:58 PM

## 2016-11-04 NOTE — Progress Notes (Signed)
Dr Emmit PomfretHugelmeyer notified of patients refusal to be cathed, verbalized understanding.

## 2016-11-04 NOTE — Clinical Social Work Note (Signed)
The patient is a Level II PASSR through [facility]. The PASSR has not been completed at this time by [facility]. The patient cannot dc until  [facility] assesses. CSW will con't following.  Misty PonderKaren Martha Dionne Rossa, MSW, LCSW-A [phone]

## 2016-11-04 NOTE — Progress Notes (Signed)
Patient ID: Misty Medina, female   DOB: [birth year], 76 y.o.   MRN: [id number]  [facility] PROGRESS NOTE  KIRANDEEP FARISS NWG:[id number] DOB: August 22, 1941 DOA: 10/25/2016 PCP: Luna Fuse, MD  HPI/Subjective:   says that she has no appetite. No abdominal pain or nausea or vomiting or diarrhea or fever. Objective: Vitals:   11/04/16 0520 11/04/16 0752  BP: (!) 143/63 (!) 152/74  Pulse: 86 77  Resp: 18 16  Temp: 98.9 F (37.2 C) 98.6 F (37 C)    Filed Weights   10/25/16 2050 10/26/16 0047 10/26/16 0715  Weight: 70.3 kg (155 lb) 74.4 kg (164 lb 1.6 oz) 77.2 kg (170 lb 3.1 oz)    ROS: Review of Systems  Constitutional: Negative for chills and fever.  Eyes: Negative for blurred vision.  Respiratory: Negative for cough and shortness of breath.   Cardiovascular: Negative for chest pain.  Gastrointestinal: Negative for abdominal pain, constipation, diarrhea, nausea and vomiting.  Genitourinary: Negative for dysuria.  Musculoskeletal: Negative for joint pain.  Neurological: Positive for weakness. Negative for dizziness, sensory change and headaches.   Exam: Physical Exam  HENT:  Nose: No mucosal edema.  Mouth/Throat: No oropharyngeal exudate or posterior oropharyngeal edema.  Eyes: Conjunctivae, EOM and lids are normal. Pupils are equal, round, and reactive to light.  Neck: No JVD present. Carotid bruit is not present. No edema present. No thyroid mass and no thyromegaly present.  Cardiovascular: S1 normal and S2 normal.  Exam reveals no gallop.   No murmur heard. Pulses:      Dorsalis pedis pulses are 2+ on the right side, and 2+ on the left side.  Respiratory: No respiratory distress. She has no decreased breath sounds. She has no wheezes. She has no rhonchi. She has no rales.  GI: Soft. Bowel sounds are normal. There is no tenderness.  Musculoskeletal:       Right ankle: She exhibits swelling.       Left ankle: She exhibits swelling.  Lymphadenopathy:   She has no cervical adenopathy.  Neurological: She is alert.  Able to wiggle toes and since that on touching her feet. Able to lift both arms up off the bed.  Skin: Skin is warm. No rash noted. Nails show no clubbing.  Psychiatric: Her mood appears anxious. She is slowed.      Data Reviewed: Basic Metabolic Panel:  Recent Labs Lab 10/29/16 1040 10/30/16 0617 10/31/16 0432 11/01/16 0522  NA 139 138 140 144  K 4.7 4.3 4.8 4.0  CL 114* 111 116* 113*  CO2 20* 20* 16* 24  GLUCOSE 120* 90 71 104*  BUN 58* 63* 65* 53*  CREATININE 2.57* 2.78* 2.82* 1.83*  CALCIUM 8.4* 8.2* 8.0* 8.3*   Liver Function Tests: No results for input(s): AST, ALT, ALKPHOS, BILITOT, PROT, ALBUMIN in the last 168 hours. No results for input(s): LIPASE, AMYLASE in the last 168 hours. CBC:  Recent Labs Lab 11/01/16 0522  WBC 6.4  HGB 9.8*  HCT 28.9*  MCV 91.4  PLT 190   Cardiac Enzymes: No results for input(s): CKTOTAL, CKMB, CKMBINDEX, TROPONINI in the last 168 hours.   Recent Results (from the past 240 hour(s))  MRSA PCR Screening     Status: None   Collection Time: 10/26/16  7:17 AM  Result Value Ref Range Status   MRSA by PCR NEGATIVE NEGATIVE Final    Comment:        The GeneXpert MRSA Assay (FDA approved for NASAL specimens only),  is one component of a comprehensive MRSA colonization surveillance program. It is not intended to diagnose MRSA infection nor to guide or monitor treatment for MRSA infections.   Culture, blood (Routine X 2) w Reflex to ID Panel     Status: None   Collection Time: 10/26/16  4:10 PM  Result Value Ref Range Status   Specimen Description BLOOD RIGHT ARM  Final   Special Requests   Final    BOTTLES DRAWN AEROBIC AND ANAEROBIC 10MLAERO,8MLANA   Culture NO GROWTH 5 DAYS  Final   Report Status 10/31/2016 FINAL  Final  Culture, blood (Routine X 2) w Reflex to ID Panel     Status: None   Collection Time: 10/26/16  4:10 PM  Result Value Ref Range Status    Specimen Description BLOOD LEFT HAND  Final   Special Requests BOTTLES DRAWN AEROBIC AND ANAEROBIC 6MLAERO,6MLANA  Final   Culture NO GROWTH 5 DAYS  Final   Report Status 10/31/2016 FINAL  Final  CULTURE, BLOOD (ROUTINE X 2) w Reflex to ID Panel     Status: None (Preliminary result)   Collection Time: 11/01/16  9:40 PM  Result Value Ref Range Status   Specimen Description BLOOD  R ARM  Final   Special Requests   Final    BOTTLES DRAWN AEROBIC AND ANAEROBIC  AER 3 ML ANA 2 ML   Culture NO GROWTH 3 DAYS  Final   Report Status PENDING  Incomplete  CULTURE, BLOOD (ROUTINE X 2) w Reflex to ID Panel     Status: Abnormal (Preliminary result)   Collection Time: 11/02/16 12:35 AM  Result Value Ref Range Status   Specimen Description BLOOD  R AC  Final   Special Requests   Final    BOTTLES DRAWN AEROBIC AND ANAEROBIC  AER 3 ML ANA 4 ML   Culture  Setup Time   Final    AEROBIC BOTTLE ONLY CRITICAL RESULT CALLED TO, READ BACK BY AND VERIFIED WITH: NATE COOKSON ON 11/03/16 AT 0315 BY TLB GRAM POSITIVE COCCI    Culture (A)  Final    STAPHYLOCOCCUS SPECIES (COAGULASE NEGATIVE) THE SIGNIFICANCE OF ISOLATING THIS ORGANISM FROM A SINGLE SET OF BLOOD CULTURES WHEN MULTIPLE SETS ARE DRAWN IS UNCERTAIN. PLEASE NOTIFY THE MICROBIOLOGY DEPARTMENT WITHIN ONE WEEK IF SPECIATION AND SENSITIVITIES ARE REQUIRED. Performed at [facility]    Report Status PENDING  Incomplete  Blood Culture ID Panel (Reflexed)     Status: Abnormal   Collection Time: 11/02/16 12:35 AM  Result Value Ref Range Status   Enterococcus species NOT DETECTED NOT DETECTED Final   Listeria monocytogenes NOT DETECTED NOT DETECTED Final   Staphylococcus species DETECTED (A) NOT DETECTED Final    Comment: CRITICAL RESULT CALLED TO, READ BACK BY AND VERIFIED WITH: NATE COOKSON ON 11/03/16 AT 0315 BY TLB    Staphylococcus aureus NOT DETECTED NOT DETECTED Final   Methicillin resistance DETECTED (A) NOT DETECTED Final    Comment:  CRITICAL RESULT CALLED TO, READ BACK BY AND VERIFIED WITH: NATE COOKSON ON 11/03/16 AT 0315 BY TLB    Streptococcus species NOT DETECTED NOT DETECTED Final   Streptococcus agalactiae NOT DETECTED NOT DETECTED Final   Streptococcus pneumoniae NOT DETECTED NOT DETECTED Final   Streptococcus pyogenes NOT DETECTED NOT DETECTED Final   Acinetobacter baumannii NOT DETECTED NOT DETECTED Final   Enterobacteriaceae species NOT DETECTED NOT DETECTED Final   Enterobacter cloacae complex NOT DETECTED NOT DETECTED Final   Escherichia coli NOT DETECTED  NOT DETECTED Final   Klebsiella oxytoca NOT DETECTED NOT DETECTED Final   Klebsiella pneumoniae NOT DETECTED NOT DETECTED Final   Proteus species NOT DETECTED NOT DETECTED Final   Serratia marcescens NOT DETECTED NOT DETECTED Final   Haemophilus influenzae NOT DETECTED NOT DETECTED Final   Neisseria meningitidis NOT DETECTED NOT DETECTED Final   Pseudomonas aeruginosa NOT DETECTED NOT DETECTED Final   Candida albicans NOT DETECTED NOT DETECTED Final   Candida glabrata NOT DETECTED NOT DETECTED Final   Candida krusei NOT DETECTED NOT DETECTED Final   Candida parapsilosis NOT DETECTED NOT DETECTED Final   Candida tropicalis NOT DETECTED NOT DETECTED Final  Urine culture     Status: Abnormal (Preliminary result)   Collection Time: 11/02/16  2:28 PM  Result Value Ref Range Status   Specimen Description URINE, RANDOM  Final   Special Requests NONE  Final   Culture (A)  Final    20,000 COLONIES/mL CITROBACTER SPECIES 10,000 COLONIES/mL ENTEROBACTER SPECIES SUSCEPTIBILITIES TO FOLLOW Performed at [facility]    Report Status PENDING  Incomplete     Studies: No results found.  Scheduled Meds: . ARIPiprazole  2 mg Oral Daily  . baclofen  10 mg Oral TID  . cefTRIAXone  1 g Intravenous Q24H  . citalopram  30 mg Oral Daily  . docusate sodium  200 mg Oral BID  . heparin  5,000 Units Subcutaneous Q8H  . levothyroxine  75 mcg Oral QAC breakfast   . mirtazapine  7.5 mg Oral QHS  . pantoprazole  40 mg Oral BID AC  . pregabalin  25 mg Oral Daily  . tamsulosin  0.4 mg Oral Daily   Continuous Infusions:   Assessment/Plan:   1. Acute kidney injury on chronic kidney disease stage III. Seen by nephrology and renal function is improved continue to monitor    2. Fever due to UTI patient started on IV ceftriaxone . Urine culture showed Citrobacter, Escherichia coli but less than 100,000. Colonies 3.  4. Acute hypoxic respiratory failure with Asthma exacerbation. Continue nebulizer treatments. Continue oxygen as needed chest x-ray shows no pneumonia/and lungs are clear today. Continue oxygen and wean off as tolerated.   5. Diarrhea. Resolved  6.  7. Hypothyroidism ; continue on .    8. GERD and recent gastric ulcer on endoscopy on Protonix. 9.  10. Restless leg syndrome, Mirapex discontinued. 11.  12. Fibromyalgia on Lyrica (decrease dose) and baclofen 13.  14. Depression continue psychiatric medication.seen by pychiatry 15.   deconditioning physical therapy recommended skilled nursing, patient will [facility] stable. Code Status:     Code Status Orders        Start     Ordered   10/26/16 0026  Do not attempt resuscitation (DNR)  Continuous    Question Answer Comment  In the event of cardiac or respiratory ARREST Do not call a "code blue"   In the event of cardiac or respiratory ARREST Do not perform Intubation, CPR, defibrillation or ACLS   In the event of cardiac or respiratory ARREST Use medication by any route, position, wound care, and other measures to relive pain and suffering. May use oxygen, suction and manual treatment of airway obstruction as needed for comfort.      10/26/16 0025    Code Status History    Date Active Date Inactive Code Status Order ID Comments User Context   08/28/2016  1:22 PM 09/03/2016  3:21 AM Full Code [id number]  Alfredo Batty  Imogene Burnhen, MD Inpatient   09/14/2015 10:36 PM  09/16/2015  9:58 PM Full Code [id number]  Oralia Manisavid Willis, MD Inpatient    Advance Directive Documentation   Flowsheet Row Most Recent Value  Type of Advance Directive  Healthcare Power of Attorney, Living will  Pre-existing out of facility DNR order (yellow form or pink MOST form)  No data  "MOST" Form in Place?  No data     Disposition Plan: Physical therapy recommended rehabilitation  Consultants:  Critical care specialist  Time spent: 25 minutes.   Apple ComputerKONIDENA,Laine Fonner  [employer] [facility]

## 2016-11-05 LAB — URINE CULTURE

## 2016-11-05 LAB — BASIC METABOLIC PANEL
Anion gap: 5 (ref 5–15)
BUN: 21 mg/dL — AB (ref 6–20)
CALCIUM: 8.1 mg/dL — AB (ref 8.9–10.3)
CHLORIDE: 110 mmol/L (ref 101–111)
CO2: 29 mmol/L (ref 22–32)
CREATININE: 0.97 mg/dL (ref 0.44–1.00)
GFR calc non Af Amer: 56 mL/min — ABNORMAL LOW (ref >60)
GLUCOSE: 89 mg/dL (ref 65–99)
Potassium: 3 mmol/L — ABNORMAL LOW (ref 3.5–5.1)
Sodium: 144 mmol/L (ref 135–145)

## 2016-11-05 LAB — CULTURE, BLOOD (ROUTINE X 2)

## 2016-11-05 MED ORDER — ONDANSETRON HCL 4 MG/2ML IJ SOLN: 4.0000 mg | INTRAMUSCULAR | Status: DC | PRN

## 2016-11-05 MED ORDER — ONDANSETRON HCL 4 MG/2ML IJ SOLN: 4.0000 mg | Freq: Four times a day (QID) | INTRAMUSCULAR | Status: DC | PRN

## 2016-11-05 MED ORDER — ONDANSETRON HCL 4 MG PO TABS: 4.0000 mg | ORAL_TABLET | Freq: Four times a day (QID) | ORAL | Status: DC | PRN

## 2016-11-05 MED ORDER — IPRATROPIUM-ALBUTEROL 0.5-2.5 (3) MG/3ML IN SOLN
3.0000 mL | RESPIRATORY_TRACT | Status: DC
  Administered 2016-11-05 – 2016-11-06 (×5): 3 mL via RESPIRATORY_TRACT
  Filled 2016-11-05 (×5): qty 3

## 2016-11-05 MED ORDER — POTASSIUM CHLORIDE 2 MEQ/ML IV SOLN
30.0000 meq | Freq: Once | INTRAVENOUS | Status: AC
  Administered 2016-11-05: 30 meq via INTRAVENOUS
  Filled 2016-11-05: qty 15

## 2016-11-05 MED ORDER — METHYLPREDNISOLONE SODIUM SUCC 125 MG IJ SOLR
60.0000 mg | INTRAMUSCULAR | Status: DC
  Administered 2016-11-05 – 2016-11-06 (×2): 60 mg via INTRAVENOUS
  Filled 2016-11-05 (×2): qty 2

## 2016-11-05 MED ORDER — ONDANSETRON HCL 4 MG PO TABS: 4.0000 mg | ORAL_TABLET | ORAL | Status: DC | PRN

## 2016-11-05 NOTE — Progress Notes (Signed)
Patient ID: Misty Medina, female   DOB: 06-29-1941, 76 y.o.   MRN: [id number]  [facility] PROGRESS NOTE  DELORICE BANNISTER WUJ:[id number] DOB: [birth year] DOA: 10/25/2016 PCP: Luna Fuse, MD  HPI/Subjective:  has some wheezing today. He has poor by mouth intake:eats only icecream.   Objective: Vitals:   11/05/16 0751 11/05/16 1231  BP: (!) 158/66 139/70  Pulse: 77 88  Resp: 16 20  Temp: 98 F (36.7 C) 98 F (36.7 C)    Filed Weights   10/25/16 2050 10/26/16 0047 10/26/16 0715  Weight: 70.3 kg (155 lb) 74.4 kg (164 lb 1.6 oz) 77.2 kg (170 lb 3.1 oz)    ROS: Review of Systems  Constitutional: Negative for chills and fever.  Eyes: Negative for blurred vision.  Respiratory: Positive for cough and wheezing.   Cardiovascular: Negative for chest pain.  Gastrointestinal: Negative for abdominal pain, constipation, diarrhea, nausea and vomiting.  Genitourinary: Negative for dysuria.  Musculoskeletal: Negative for joint pain.  Neurological: Positive for weakness. Negative for dizziness, sensory change and headaches.   Exam: Physical Exam  HENT:  Nose: No mucosal edema.  Mouth/Throat: No oropharyngeal exudate or posterior oropharyngeal edema.  Eyes: Conjunctivae, EOM and lids are normal. Pupils are equal, round, and reactive to light.  Neck: No JVD present. Carotid bruit is not present. No edema present. No thyroid mass and no thyromegaly present.  Cardiovascular: S1 normal and S2 normal.  Exam reveals no gallop.   No murmur heard. Pulses:      Dorsalis pedis pulses are 2+ on the right side, and 2+ on the left side.  Respiratory: She has no decreased breath sounds. She has wheezes. She has no rhonchi. She exhibits no tenderness.  GI: Soft. Bowel sounds are normal. There is no tenderness.  Musculoskeletal:       Right ankle: She exhibits swelling.       Left ankle: She exhibits swelling.  Lymphadenopathy:    She has no cervical adenopathy.  Neurological: She is  alert.  Able to wiggle toes and since that on touching her feet. Able to lift both arms up off the bed.  Skin: Skin is warm. No rash noted. Nails show no clubbing.  Psychiatric: Her mood appears anxious. She is slowed.      Data Reviewed: Basic Metabolic Panel:  Recent Labs Lab 10/30/16 0617 10/31/16 0432 11/01/16 0522 11/05/16 0608  NA 138 140 144 144  K 4.3 4.8 4.0 3.0*  CL 111 116* 113* 110  CO2 20* 16* 24 29  GLUCOSE 90 71 104* 89  BUN 63* 65* 53* 21*  CREATININE 2.78* 2.82* 1.83* 0.97  CALCIUM 8.2* 8.0* 8.3* 8.1*   Liver Function Tests: No results for input(s): AST, ALT, ALKPHOS, BILITOT, PROT, ALBUMIN in the last 168 hours. No results for input(s): LIPASE, AMYLASE in the last 168 hours. CBC:  Recent Labs Lab 11/01/16 0522  WBC 6.4  HGB 9.8*  HCT 28.9*  MCV 91.4  PLT 190   Cardiac Enzymes: No results for input(s): CKTOTAL, CKMB, CKMBINDEX, TROPONINI in the last 168 hours.   Recent Results (from the past 240 hour(s))  Culture, blood (Routine X 2) w Reflex to ID Panel     Status: None   Collection Time: 10/26/16  4:10 PM  Result Value Ref Range Status   Specimen Description BLOOD RIGHT ARM  Final   Special Requests   Final    BOTTLES DRAWN AEROBIC AND ANAEROBIC 10MLAERO,8MLANA   Culture NO GROWTH 5  DAYS  Final   Report Status 10/31/2016 FINAL  Final  Culture, blood (Routine X 2) w Reflex to ID Panel     Status: None   Collection Time: 10/26/16  4:10 PM  Result Value Ref Range Status   Specimen Description BLOOD LEFT HAND  Final   Special Requests BOTTLES DRAWN AEROBIC AND ANAEROBIC 6MLAERO,6MLANA  Final   Culture NO GROWTH 5 DAYS  Final   Report Status 10/31/2016 FINAL  Final  CULTURE, BLOOD (ROUTINE X 2) w Reflex to ID Panel     Status: None (Preliminary result)   Collection Time: 11/01/16  9:40 PM  Result Value Ref Range Status   Specimen Description BLOOD  R ARM  Final   Special Requests   Final    BOTTLES DRAWN AEROBIC AND ANAEROBIC  AER 3 ML  ANA 2 ML   Culture NO GROWTH 4 DAYS  Final   Report Status PENDING  Incomplete  CULTURE, BLOOD (ROUTINE X 2) w Reflex to ID Panel     Status: Abnormal   Collection Time: 11/02/16 12:35 AM  Result Value Ref Range Status   Specimen Description BLOOD  R AC  Final   Special Requests   Final    BOTTLES DRAWN AEROBIC AND ANAEROBIC  AER 3 ML ANA 4 ML   Culture  Setup Time   Final    AEROBIC BOTTLE ONLY CRITICAL RESULT CALLED TO, READ BACK BY AND VERIFIED WITH: NATE COOKSON ON 11/03/16 AT 0315 BY TLB GRAM POSITIVE COCCI    Culture (A)  Final    STAPHYLOCOCCUS SPECIES (COAGULASE NEGATIVE) THE SIGNIFICANCE OF ISOLATING THIS ORGANISM FROM A SINGLE SET OF BLOOD CULTURES WHEN MULTIPLE SETS ARE DRAWN IS UNCERTAIN. PLEASE NOTIFY THE MICROBIOLOGY DEPARTMENT WITHIN ONE WEEK IF SPECIATION AND SENSITIVITIES ARE REQUIRED. [facility]    Report Status 11/05/2016 FINAL  Final  Blood Culture ID Panel (Reflexed)     Status: Abnormal   Collection Time: 11/02/16 12:35 AM  Result Value Ref Range Status   Enterococcus species NOT DETECTED NOT DETECTED Final   Listeria monocytogenes NOT DETECTED NOT DETECTED Final   Staphylococcus species DETECTED (A) NOT DETECTED Final    Comment: CRITICAL RESULT CALLED TO, READ BACK BY AND VERIFIED WITH: NATE COOKSON ON 11/03/16 AT 0315 BY TLB    Staphylococcus aureus NOT DETECTED NOT DETECTED Final   Methicillin resistance DETECTED (A) NOT DETECTED Final    Comment: CRITICAL RESULT CALLED TO, READ BACK BY AND VERIFIED WITH: NATE COOKSON ON 11/03/16 AT 0315 BY TLB    Streptococcus species NOT DETECTED NOT DETECTED Final   Streptococcus agalactiae NOT DETECTED NOT DETECTED Final   Streptococcus pneumoniae NOT DETECTED NOT DETECTED Final   Streptococcus pyogenes NOT DETECTED NOT DETECTED Final   Acinetobacter baumannii NOT DETECTED NOT DETECTED Final   Enterobacteriaceae species NOT DETECTED NOT DETECTED Final   Enterobacter cloacae complex NOT DETECTED NOT  DETECTED Final   Escherichia coli NOT DETECTED NOT DETECTED Final   Klebsiella oxytoca NOT DETECTED NOT DETECTED Final   Klebsiella pneumoniae NOT DETECTED NOT DETECTED Final   Proteus species NOT DETECTED NOT DETECTED Final   Serratia marcescens NOT DETECTED NOT DETECTED Final   Haemophilus influenzae NOT DETECTED NOT DETECTED Final   Neisseria meningitidis NOT DETECTED NOT DETECTED Final   Pseudomonas aeruginosa NOT DETECTED NOT DETECTED Final   Candida albicans NOT DETECTED NOT DETECTED Final   Candida glabrata NOT DETECTED NOT DETECTED Final   Candida krusei NOT DETECTED NOT  DETECTED Final   Candida parapsilosis NOT DETECTED NOT DETECTED Final   Candida tropicalis NOT DETECTED NOT DETECTED Final  Urine culture     Status: Abnormal   Collection Time: 11/02/16  2:28 PM  Result Value Ref Range Status   Specimen Description URINE, RANDOM  Final   Special Requests NONE  Final   Culture (A)  Final    20,000 COLONIES/mL CITROBACTER BRAAKII 10,000 COLONIES/mL ENTEROBACTER ASBURIAE    Report Status 11/05/2016 FINAL  Final   Organism ID, Bacteria CITROBACTER BRAAKII (A)  Final   Organism ID, Bacteria ENTEROBACTER ASBURIAE (A)  Final      Susceptibility   Citrobacter braakii - MIC*    CEFAZOLIN >=64 RESISTANT Resistant     CEFTRIAXONE <=1 SENSITIVE Sensitive     CIPROFLOXACIN <=0.25 SENSITIVE Sensitive     GENTAMICIN <=1 SENSITIVE Sensitive     IMIPENEM <=0.25 SENSITIVE Sensitive     NITROFURANTOIN <=16 SENSITIVE Sensitive     TRIMETH/SULFA <=20 SENSITIVE Sensitive     PIP/TAZO <=4 SENSITIVE Sensitive     * 20,000 COLONIES/mL CITROBACTER BRAAKII   Enterobacter asburiae - MIC*    CEFAZOLIN >=64 RESISTANT Resistant     CEFTRIAXONE <=1 SENSITIVE Sensitive     CIPROFLOXACIN <=0.25 SENSITIVE Sensitive     GENTAMICIN <=1 SENSITIVE Sensitive     IMIPENEM 0.5 SENSITIVE Sensitive     NITROFURANTOIN 64 INTERMEDIATE Intermediate     TRIMETH/SULFA <=20 SENSITIVE Sensitive     PIP/TAZO <=4  SENSITIVE Sensitive     * 10,000 COLONIES/mL ENTEROBACTER ASBURIAE     Studies: No results found.  Scheduled Meds: . ARIPiprazole  2 mg Oral Daily  . baclofen  10 mg Oral TID  . cefTRIAXone  1 g Intravenous Q24H  . citalopram  30 mg Oral Daily  . docusate sodium  200 mg Oral BID  . heparin  5,000 Units Subcutaneous Q8H  . levothyroxine  75 mcg Oral QAC breakfast  . mirtazapine  7.5 mg Oral QHS  . pantoprazole  40 mg Oral BID AC  . pregabalin  25 mg Oral Daily  . tamsulosin  0.4 mg Oral Daily   Continuous Infusions:   Assessment/Plan:   1. Acute kidney injury on chronic kidney disease stage III. Seen by nephrology and renal function is improved continue to monitor    2. Fever due to UTI patient started on IV ceftriaxone . Urine culture showed Citrobacter, Escherichia coli but less than 100,000. Colonies  3.  4. Acute hypoxic respiratory failure with Asthma exacerbation. Continue nebulizer treatments. Continue oxygen as needed chest x-ray shows no pneumonia/ 5. Patient has wheezing today. Continue nebulizers as scheduled for another 24 hours to help with the wheezing.add iv steroids.   6. Diarrhea. Resolved  7.  8. Hypothyroidism ; continue onOn Synthyroid..    9. GERD and recent gastric ulcer on endoscopy on Protonix.. 10. Restless leg syndrome, Mirapex discontinued.  11. Fibromyalgia on Lyrica (decrease dose) and baclofen. 12. Depression continue psychiatric medication.seen by pychiatry  deconditioning physical therapy recommended skilled nursing, patient will go to [facility] when stable. Code Status:     Code Status Orders        Start     Ordered   10/26/16 0026  Do not attempt resuscitation (DNR)  Continuous    Question Answer Comment  In the event of cardiac or respiratory ARREST Do not call a "code blue"   In the event of cardiac or respiratory ARREST Do not perform Intubation, CPR,  defibrillation or ACLS   In the event of cardiac or respiratory  ARREST Use medication by any route, position, wound care, and other measures to relive pain and suffering. May use oxygen, suction and manual treatment of airway obstruction as needed for comfort.      10/26/16 0025    Code Status History    Date Active Date Inactive Code Status Order ID Comments User Context   08/28/2016  1:22 PM 09/03/2016  3:21 AM Full Code [id number]  Shaune Pollack, MD Inpatient   09/14/2015 10:36 PM 09/16/2015  9:58 PM Full Code [id number]  Oralia Manis, MD Inpatient    Advance Directive Documentation   Flowsheet Row Most Recent Value  Type of Advance Directive  Healthcare Power of Attorney, Living will  Pre-existing out of facility DNR order (yellow form or pink MOST form)  No data  "MOST" Form in Place?  No data     Disposition Plan: Physical therapy recommended rehabilitation  Consultants:  Critical care specialist  Time spent: 25 minutes.   [employer]

## 2016-11-05 NOTE — Plan of Care (Signed)
Problem: Activity: Goal: Risk for activity intolerance will decrease Outcome: Not Progressing Patient is weak and would benefit from physical therapy.  Problem: Activity: Goal: Capacity to carry out activities will improve Outcome: Not Progressing Patient is very weak.  Problem: Education: Goal: Ability to demonstrate managment of disease process will improve Outcome: Not Progressing Patient needs assistance.  Goal: Ability to verbalize understanding of medication therapies will improve Outcome: Not Progressing Patient needs assistance.  Problem: Health Behavior/Discharge Planning: Goal: Ability to manage health-related needs will improve for discharge Outcome: Progressing Patient needs assistance.

## 2016-11-06 LAB — BASIC METABOLIC PANEL
Anion gap: 4 — ABNORMAL LOW (ref 5–15)
BUN: 16 mg/dL (ref 6–20)
CO2: 28 mmol/L (ref 22–32)
Calcium: 8.1 mg/dL — ABNORMAL LOW (ref 8.9–10.3)
Chloride: 111 mmol/L (ref 101–111)
Creatinine, Ser: 0.86 mg/dL (ref 0.44–1.00)
GFR calc Af Amer: >60 mL/min (ref >60)
GFR calc non Af Amer: >60 mL/min (ref >60)
GLUCOSE: 108 mg/dL — AB (ref 65–99)
Potassium: 3.4 mmol/L — ABNORMAL LOW (ref 3.5–5.1)
Sodium: 143 mmol/L (ref 135–145)

## 2016-11-06 LAB — CULTURE, BLOOD (ROUTINE X 2): Culture: NO GROWTH

## 2016-11-06 MED ORDER — LEVOFLOXACIN 500 MG PO TABS: 500.0000 mg | ORAL_TABLET | Freq: Every day | ORAL | 0 refills | Status: AC

## 2016-11-06 MED ORDER — ARIPIPRAZOLE 2 MG PO TABS: 2.0000 mg | ORAL_TABLET | Freq: Every day | ORAL | 0 refills | Status: AC

## 2016-11-06 MED ORDER — IPRATROPIUM-ALBUTEROL 0.5-2.5 (3) MG/3ML IN SOLN: 3.0000 mL | RESPIRATORY_TRACT | 0 refills | Status: AC

## 2016-11-06 MED ORDER — PREDNISONE 10 MG (21) PO TBPK: 10.0000 mg | ORAL_TABLET | Freq: Every day | ORAL | 0 refills | Status: AC

## 2016-11-06 MED ORDER — TAMSULOSIN HCL 0.4 MG PO CAPS: 0.4000 mg | ORAL_CAPSULE | Freq: Every day | ORAL | 0 refills | Status: AC

## 2016-11-06 MED ORDER — MIRTAZAPINE 7.5 MG PO TABS: 7.5000 mg | ORAL_TABLET | Freq: Every day | ORAL | 0 refills | Status: AC

## 2016-11-06 NOTE — Clinical Social Work Note (Addendum)
Patient has received her level 2 pasrr and thus can discharge today to [facility]. Palliative Care is asking that Palliative Care follow her at [facility]. York SpanielMonica Derenda Giddings MSW,LCSW [phone]

## 2016-11-06 NOTE — Clinical Social Work Note (Signed)
Pasrr number available now and patient is to discharge to [facility] today. DC info sent to [facility] today. Patient has informed her daughter of her discharge. Patient to go via EMS. York SpanielMonica Josette Shimabukuro MSW,LCSW [phone]

## 2016-11-06 NOTE — Progress Notes (Signed)
Physical Therapy Treatment Patient Details Name: Misty Medina MRN: [id number] DOB: 27-Apr-1941 Today's Date: 11/06/2016    History of Present Illness Pt admitted for acute on chronic renal failure. Pt complains of weakness from N/V. History includes depression, fibromyalgia, and GERD. Pt has been evaluated by psych and palliative care.    PT Comments    Pt stated that she was feeling alright and willing to participate in PT treatments and that she had been performing her supine exercises. She was able to perform supine there-ex independently 1x12 with some verbal cues to ensure correct techniques. Patient was able to transfer from supine to sitting with mod assist and frequent verbal cues from the PT . She sat at the edge of the bed for 3-4 minutes and was able to do Long arc quads, she required mod assist to maintain sitting balance. Her sitting balance was limited by fatigue. Pt currently displays bed mobility, balance, transfers and ambulation deficits and is unsafe for basic household ambulation. She will continue to benefit from skilled PT in a SNF to improve functional mobility.       Follow Up Recommendations  SNF     Equipment Recommendations       Recommendations for Other Services       Precautions / Restrictions Precautions Precautions: Fall Restrictions Weight Bearing Restrictions: No    Mobility  Bed Mobility Overal bed mobility: Needs Assistance Bed Mobility: Supine to Sit Rolling: Mod assist   Supine to sit: Mod assist     General bed mobility comments: Patient required mod assist and verbal cues for hand placement in moving from supine to sitting, Once at EOB she required mod assist to maintain upright position. She displayed improvements in sitting tolerance.  Transfers                 General transfer comment: not performed secondary to weakness/balance  Ambulation/Gait                 Stairs            Wheelchair Mobility     Modified Rankin (Stroke Patients Only)       Balance Overall balance assessment: Needs assistance Sitting-balance support: Bilateral upper extremity supported Sitting balance-Leahy Scale: Fair Sitting balance - Comments: patient has limited sitting balance endurance Postural control: Left lateral lean                          Cognition Arousal/Alertness: Awake/alert Behavior During Therapy: WFL for tasks assessed/performed Overall Cognitive Status: Within Functional Limits for tasks assessed                      Exercises Other Exercises Other Exercises: Supine ther-ex performed including B LE including ankle pumps, quad sets, SLRs, SAQ, hip abd/add,. All ther-ex performed x 12 reps with cues for correct technique with SBA.  Other Exercises: Sitting ther-ex performed including long arc, performed x 12 reps with cues for correct technique, AROM with SBA (O2 sat was 96 on 2L/min during exercises, HR was 94, RR appeared WNL)    General Comments        Pertinent Vitals/Pain Pain Location: "all over"    Home Living                      Prior Function            PT Goals (current goals can now be found  in the care plan section) Acute Rehab PT Goals Patient Stated Goal: regain strength  PT Goal Formulation: With patient Time For Goal Achievement: 11/10/16 Potential to Achieve Goals: Fair Additional Goals Additional Goal #1: Pt will be able to perform bed mobility/transfers with independence and LRAD in order to improve functional mobility. Progress towards PT goals: Progressing toward goals    Frequency    Min 2X/week      PT Plan Current plan remains appropriate    Co-evaluation             End of Session Equipment Utilized During Treatment: Oxygen Activity Tolerance: Patient limited by fatigue Patient left: in bed;with call bell/phone within reach;with bed alarm set     Time: 1114-1130 PT Time Calculation (min) (ACUTE  ONLY): 16 min  Charges:                       G Codes:      [employer] Destaney Sarkis Student PT  11/06/2016, 12:03 PM

## 2016-11-06 NOTE — Progress Notes (Signed)
Pt prepared for d/c to SNF, [facility]. IV d/c'd. Skin intact except as charted in most recent assessments. Vitals are stable. Report called to receiving facility. Pt to be transported by ambulance service.  [place] Sp[place]ler [place]

## 2016-11-06 NOTE — Care Management Important Message (Signed)
Important Message  Patient Details  Name: Misty Medina MRN: [id number] Date of Birth: 05/28/1941   Medicare Important Message Given:  Yes    Chapman FitchBOWEN, Karinne Schmader T, RN 11/06/2016, 10:18 AM

## 2016-11-06 NOTE — Discharge Summary (Signed)
Misty Medina, is a 76 y.o. female  DOB 1941-06-26  MRN [id number].  Admission date:  10/25/2016  Admitting Physician  Oralia Manis, MD  Discharge Date:  11/06/2016   Primary MD  Luna Fuse, MD  Recommendations for primary care physician for things to follow:  Follow-up primary doctor in 1 week    Admission Diagnosis  Dehydration [E86.0] Acute renal failure, unspecified acute renal failure type (HCC) [N17.9] Diarrhea, unspecified type [R19.7]   Discharge Diagnosis  Dehydration [E86.0] Acute renal failure, unspecified acute renal failure type (HCC) [N17.9] Diarrhea, unspecified type [R19.7]   Principal Problem:   Severe recurrent major depressive disorder with psychotic features (HCC) Active Problems:   Hypothyroidism   Chronic systolic CHF (congestive heart failure) (HCC)   HTN (hypertension)   GERD (gastroesophageal reflux disease)   Nausea vomiting and diarrhea   Acute on chronic renal failure (HCC)   AKI (acute kidney injury) (HCC)   Acute kidney injury (HCC)   Fibromyalgia   Palliative care encounter   Goals of care, counseling/discussion      Past Medical History:  Diagnosis Date  . Depression   . Fibromyalgia   . GERD (gastroesophageal reflux disease)   . Heart murmur   . Hypertension   . Hypothyroidism   . Osteoporosis     Past Surgical History:  Procedure Laterality Date  . ABDOMINAL HYSTERECTOMY    . CHOLECYSTECTOMY    . ESOPHAGOGASTRODUODENOSCOPY (EGD) WITH PROPOFOL N/A 08/29/2016   Procedure: ESOPHAGOGASTRODUODENOSCOPY (EGD) WITH PROPOFOL;  Surgeon: Ula Lingo, MD;  Location: [facility] ENDOSCOPY;  Service: Endoscopy;  Laterality: N/A;       History of present illness and  Hospital Course:     Kindly see H&P for history of present illness and admission details, please review  complete Labs, Consult reports and Test reports for all details in brief  HPI  from the history and physical done on the day of admission Misty Medina  is a 75 y.o. female who presents with Nausea vomiting and diarrhea for the past several days. Patient denies any recent antibiotic use, she denies any fevers/chills, abdominal pain, or other infectious symptoms. On lab evaluation here she was found to have acute on chronic renal injury in the setting of profound dehydration.    Hospital Course  Acute kidney injury on CKD stage 3;: Started on IV hydration kidney function improved creatinine 2.admission potassium 5.7.atient's creatinine normalized, BUN 16 ,creatinine 0.86. Dehydration corrected.Seen by nephrology. 2,Acute hypoxic respiratory failure with Asthma exacerbation;improved with iv steroids,o2,nebs,chest xray showed no pneumonia. She feels better today.discharge with  tapering dose of steroids,nebs,abx.  GERD and recent gastric ulcer on endoscopy on Protonix.Marland Kitchen Restless leg syndrome, Mirapex discontinued. Fibromyalgia on Lyrica (decrease dose) and baclofen. Depression continue psychiatric medication.seen by pychiatry  deconditioning physical therapy recommended skilled nursing, patient will go to [facility]  Fever due to UTI patient started on IV ceftriaxone . Urine culture showed Citrobacter, Escherichia coli but less than 100,000. Colonies   Discharge Condition: stable   Follow UP  Contact information for after-discharge care    Destination    [facility] .   Specialty:  Skilled Nursing Facility Contact information: [address] [place] [address] [phone]                Discharge Instructions  and  Discharge Medications      Allergies as of 11/06/2016      Reactions   Codeine Nausea And Vomiting  Penicillins Other (See Comments)   Reaction:  Unknown       Medication List    STOP taking these medications    HYDROcodone-acetaminophen 10-325 MG tablet Commonly known as:  NORCO   zolpidem 5 MG tablet Commonly known as:  AMBIEN     TAKE these medications   ARIPiprazole 2 MG tablet Commonly known as:  ABILIFY Take 1 tablet (2 mg total) by mouth daily. Start taking on:  11/07/2016   b complex vitamins tablet Take 1 tablet by mouth daily.   baclofen 10 MG tablet Commonly known as:  LIORESAL Take 10 mg by mouth 3 (three) times daily.   Biotin 1000 MCG tablet Take 1,000 mcg by mouth daily.   busPIRone 15 MG tablet Commonly known as:  BUSPAR Take 15 mg by mouth 2 (two) times daily.   carvedilol 6.25 MG tablet Commonly known as:  COREG Take 6.25 mg by mouth 2 (two) times daily with a meal.   chlorthalidone 25 MG tablet Commonly known as:  HYGROTON Take 0.5 tablets (12.5 mg total) by mouth every other day.   citalopram 40 MG tablet Commonly known as:  CELEXA Take 40 mg by mouth daily.   DUREZOL 0.05 % Emul Generic drug:  Difluprednate Place 1 drop into both eyes daily.   ferrous fumarate-iron polysaccharide complex 162-115.2 MG Caps capsule Commonly known as:  TANDEM Take 1 capsule by mouth daily.   ILEVRO 0.3 % ophthalmic suspension Generic drug:  nepafenac Place 1 drop into both eyes daily.   ipratropium-albuterol 0.5-2.5 (3) MG/3ML Soln Commonly known as:  DUONEB Take 3 mLs by nebulization every 4 (four) hours.   levofloxacin 500 MG tablet Commonly known as:  LEVAQUIN Take 1 tablet (500 mg total) by mouth daily.   levothyroxine 75 MCG tablet Commonly known as:  SYNTHROID, LEVOTHROID Take 75 mcg by mouth daily.   lisinopril 5 MG tablet Commonly known as:  PRINIVIL,ZESTRIL Take 5 mg by mouth every morning.   loratadine 10 MG tablet Commonly known as:  CLARITIN Take 10 mg by mouth daily.   magnesium oxide 400 MG tablet Commonly known as:  MAG-OX Take 400 mg by mouth daily.   mirtazapine 7.5 MG tablet Commonly known as:  REMERON Take 1 tablet (7.5 mg  total) by mouth at bedtime.   ondansetron 4 MG tablet Commonly known as:  ZOFRAN Take 1 tablet (4 mg total) by mouth every 6 (six) hours as needed for nausea.   pantoprazole 40 MG tablet Commonly known as:  PROTONIX Take 1 tablet (40 mg total) by mouth 2 (two) times daily before a meal.   predniSONE 10 MG (21) Tbpk tablet Commonly known as:  STERAPRED UNI-PAK 21 TAB Take 1 tablet (10 mg total) by mouth daily. Take as prescribed   pregabalin 25 MG capsule Commonly known as:  LYRICA Take 25 mg by mouth 2 (two) times daily.   rOPINIRole 0.5 MG tablet Commonly known as:  REQUIP Take 1 tablet by mouth every evening.   tamsulosin 0.4 MG Caps capsule Commonly known as:  FLOMAX Take 1 capsule (0.4 mg total) by mouth daily. Start taking on:  11/07/2016   tolterodine 4 MG 24 hr capsule Commonly known as:  DETROL LA Take 4 mg by mouth daily.   Vitamin D-3 1000 units Caps Take 1,000 Units by mouth daily.         Diet and Activity recommendation: See Discharge Instructions above   Consults obtained - psychiatric, physical therapy, nephrology  Major procedures and Radiology Reports - PLEASE review detailed and final reports for all details, in brief -     Dg Chest 1 View  Result Date: 11/01/2016 CLINICAL DATA:  Initial evaluation for acute shortness of breath. EXAM: CHEST 1 VIEW COMPARISON:  Prior radiograph from 10/26/2016. FINDINGS: Moderate cardiomegaly, stable. Mediastinal silhouette within normal limits. Aortic atherosclerosis noted. Lungs hypoinflated. No focal infiltrates identified. No pulmonary edema or pleural effusion. No pneumothorax. No acute osseous abnormality. IMPRESSION: 1. Shallow lung inflation with no active cardiopulmonary disease identified. 2. Stable cardiomegaly. 3. Aortic atherosclerosis. Electronically Signed   By: Rise Mu M.D.   On: 11/01/2016 22:19   Ct Head Wo Contrast  Result Date: 10/27/2016 CLINICAL DATA:  Loss of feeling sensation.  The patient states that she is dying and asked for the chaplain. She states that she has no feeling throughout her body. She states she can't swallow. Not feeling well at all. EXAM: CT HEAD WITHOUT CONTRAST TECHNIQUE: Contiguous axial images were obtained from the base of the skull through the vertex without intravenous contrast. COMPARISON:  06/30/2013 FINDINGS: Brain: There is central and cortical atrophy. Periventricular white matter changes are consistent with small vessel disease. There is no intra or extra-axial fluid collection or mass lesion. The basilar cisterns and ventricles have a normal appearance. There is no CT evidence for acute infarction or hemorrhage. Vascular: There is atherosclerotic calcification of the carotid siphons. Skull: Normal. Negative for fracture or focal lesion. Sinuses/Orbits: There is soft tissue opacity within the right maxillary sinus consistent with mucous retention cyst, polyp or mucosal thickening. No air-fluid levels are identified. The mastoid air cells are normally aerated. Other: Orbits are unremarkable. IMPRESSION: 1. Atrophy and small vessel disease. 2.  No evidence for acute intracranial abnormality. 3. Probable right maxillary sinus mucous retention cyst or polyp. Electronically Signed   By: Norva Pavlov M.D.   On: 10/27/2016 18:07   US Venous Img Lower Bilateral  Result Date: 10/26/2016 CLINICAL DATA:  Bilateral leg pain. Shortness of breath. Evaluate for DVT. EXAM: BILATERAL LOWER EXTREMITY VENOUS DOPPLER ULTRASOUND TECHNIQUE: Gray-scale sonography with graded compression, as well as color Doppler and duplex ultrasound were performed to evaluate the lower extremity deep venous systems from the level of the common femoral vein and including the common femoral, femoral, profunda femoral, popliteal and calf veins including the posterior tibial, peroneal and gastrocnemius veins when visible. The superficial great saphenous vein was also interrogated. Spectral  Doppler was utilized to evaluate flow at rest and with distal augmentation maneuvers in the common femoral, femoral and popliteal veins. COMPARISON:  None. FINDINGS: RIGHT LOWER EXTREMITY Common Femoral Vein: No evidence of thrombus. Normal compressibility, respiratory phasicity and response to augmentation. Saphenofemoral Junction: No evidence of thrombus. Normal compressibility and flow on color Doppler imaging. Profunda Femoral Vein: No evidence of thrombus. Normal compressibility and flow on color Doppler imaging. Femoral Vein: No evidence of thrombus. Normal compressibility, respiratory phasicity and response to augmentation. Popliteal Vein: No evidence of thrombus. Normal compressibility, respiratory phasicity and response to augmentation. Calf Veins: No evidence of thrombus. Normal compressibility and flow on color Doppler imaging. Superficial Great Saphenous Vein: No evidence of thrombus. Normal compressibility and flow on color Doppler imaging. Venous Reflux:  None. Other Findings:  None. LEFT LOWER EXTREMITY Common Femoral Vein: No evidence of thrombus. Normal compressibility, respiratory phasicity and response to augmentation. Saphenofemoral Junction: No evidence of thrombus. Normal compressibility and flow on color Doppler imaging. Profunda Femoral Vein: No evidence of thrombus. Normal compressibility  and flow on color Doppler imaging. Femoral Vein: No evidence of thrombus. Normal compressibility, respiratory phasicity and response to augmentation. Popliteal Vein: No evidence of thrombus. Normal compressibility, respiratory phasicity and response to augmentation. Calf Veins: No evidence of thrombus. Normal compressibility and flow on color Doppler imaging. Superficial Great Saphenous Vein: No evidence of thrombus. Normal compressibility and flow on color Doppler imaging. Venous Reflux:  None. Other Findings:  None. IMPRESSION: No evidence of DVT within either lower extremity. Electronically Signed   By:  Simonne Come M.D.   On: 10/26/2016 12:12   Dg Chest Port 1 View  Result Date: 10/26/2016 CLINICAL DATA:  Congestive heart failure. EXAM: PORTABLE CHEST 1 VIEW COMPARISON:  11/10/2015 FINDINGS: The patchy infiltrates present in the left lung on the prior study have resolved with the exception of a small area of atelectasis at the left lung base medially. Right lung is clear. Slight cardiomegaly. Calcification of the thoracic aorta. IMPRESSION: Minimal atelectasis at the left lung base.  Chronic cardiomegaly. Aortic atherosclerosis. Electronically Signed   By: Francene Boyers M.D.   On: 10/26/2016 07:09    Micro Results    Recent Results (from the past 240 hour(s))  CULTURE, BLOOD (ROUTINE X 2) w Reflex to ID Panel     Status: None   Collection Time: 11/01/16  9:40 PM  Result Value Ref Range Status   Specimen Description BLOOD  R ARM  Final   Special Requests   Final    BOTTLES DRAWN AEROBIC AND ANAEROBIC  AER 3 ML ANA 2 ML   Culture NO GROWTH 5 DAYS  Final   Report Status 11/06/2016 FINAL  Final  CULTURE, BLOOD (ROUTINE X 2) w Reflex to ID Panel     Status: Abnormal   Collection Time: 11/02/16 12:35 AM  Result Value Ref Range Status   Specimen Description BLOOD  R AC  Final   Special Requests   Final    BOTTLES DRAWN AEROBIC AND ANAEROBIC  AER 3 ML ANA 4 ML   Culture  Setup Time   Final    AEROBIC BOTTLE ONLY CRITICAL RESULT CALLED TO, READ BACK BY AND VERIFIED WITH: NATE COOKSON ON 11/03/16 AT 0315 BY TLB GRAM POSITIVE COCCI    Culture (A)  Final    STAPHYLOCOCCUS SPECIES (COAGULASE NEGATIVE) THE SIGNIFICANCE OF ISOLATING THIS ORGANISM FROM A SINGLE SET OF BLOOD CULTURES WHEN MULTIPLE SETS ARE DRAWN IS UNCERTAIN. PLEASE NOTIFY THE MICROBIOLOGY DEPARTMENT WITHIN ONE WEEK IF SPECIATION AND SENSITIVITIES ARE REQUIRED. Performed at [facility]    Report Status 11/05/2016 FINAL  Final  Blood Culture ID Panel (Reflexed)     Status: Abnormal   Collection Time: 11/02/16 12:35 AM   Result Value Ref Range Status   Enterococcus species NOT DETECTED NOT DETECTED Final   Listeria monocytogenes NOT DETECTED NOT DETECTED Final   Staphylococcus species DETECTED (A) NOT DETECTED Final    Comment: CRITICAL RESULT CALLED TO, READ BACK BY AND VERIFIED WITH: NATE COOKSON ON 11/03/16 AT 0315 BY TLB    Staphylococcus aureus NOT DETECTED NOT DETECTED Final   Methicillin resistance DETECTED (A) NOT DETECTED Final    Comment: CRITICAL RESULT CALLED TO, READ BACK BY AND VERIFIED WITH: NATE COOKSON ON 11/03/16 AT 0315 BY TLB    Streptococcus species NOT DETECTED NOT DETECTED Final   Streptococcus agalactiae NOT DETECTED NOT DETECTED Final   Streptococcus pneumoniae NOT DETECTED NOT DETECTED Final   Streptococcus pyogenes NOT DETECTED NOT DETECTED Final   Acinetobacter  baumannii NOT DETECTED NOT DETECTED Final   Enterobacteriaceae species NOT DETECTED NOT DETECTED Final   Enterobacter cloacae complex NOT DETECTED NOT DETECTED Final   Escherichia coli NOT DETECTED NOT DETECTED Final   Klebsiella oxytoca NOT DETECTED NOT DETECTED Final   Klebsiella pneumoniae NOT DETECTED NOT DETECTED Final   Proteus species NOT DETECTED NOT DETECTED Final   Serratia marcescens NOT DETECTED NOT DETECTED Final   Haemophilus influenzae NOT DETECTED NOT DETECTED Final   Neisseria meningitidis NOT DETECTED NOT DETECTED Final   Pseudomonas aeruginosa NOT DETECTED NOT DETECTED Final   Candida albicans NOT DETECTED NOT DETECTED Final   Candida glabrata NOT DETECTED NOT DETECTED Final   Candida krusei NOT DETECTED NOT DETECTED Final   Candida parapsilosis NOT DETECTED NOT DETECTED Final   Candida tropicalis NOT DETECTED NOT DETECTED Final  Urine culture     Status: Abnormal   Collection Time: 11/02/16  2:28 PM  Result Value Ref Range Status   Specimen Description URINE, RANDOM  Final   Special Requests NONE  Final   Culture (A)  Final    20,000 COLONIES/mL CITROBACTER BRAAKII 10,000 COLONIES/mL  ENTEROBACTER ASBURIAE    Report Status 11/05/2016 FINAL  Final   Organism ID, Bacteria CITROBACTER BRAAKII (A)  Final   Organism ID, Bacteria ENTEROBACTER ASBURIAE (A)  Final      Susceptibility   Citrobacter braakii - MIC*    CEFAZOLIN >=64 RESISTANT Resistant     CEFTRIAXONE <=1 SENSITIVE Sensitive     CIPROFLOXACIN <=0.25 SENSITIVE Sensitive     GENTAMICIN <=1 SENSITIVE Sensitive     IMIPENEM <=0.25 SENSITIVE Sensitive     NITROFURANTOIN <=16 SENSITIVE Sensitive     TRIMETH/SULFA <=20 SENSITIVE Sensitive     PIP/TAZO <=4 SENSITIVE Sensitive     * 20,000 COLONIES/mL CITROBACTER BRAAKII   Enterobacter asburiae - MIC*    CEFAZOLIN >=64 RESISTANT Resistant     CEFTRIAXONE <=1 SENSITIVE Sensitive     CIPROFLOXACIN <=0.25 SENSITIVE Sensitive     GENTAMICIN <=1 SENSITIVE Sensitive     IMIPENEM 0.5 SENSITIVE Sensitive     NITROFURANTOIN 64 INTERMEDIATE Intermediate     TRIMETH/SULFA <=20 SENSITIVE Sensitive     PIP/TAZO <=4 SENSITIVE Sensitive     * 10,000 COLONIES/mL ENTEROBACTER ASBURIAE       Today   Subjective:   Misty Medina today has no headache,no chest abdominal pain,no new weakness tingling or numbness, feels much better wants to go snf.   Objective:   Blood pressure (!) 141/58, pulse 92, temperature 97.9 F (36.6 C), temperature source Oral, resp. rate 20, height 5\' 3"  (1.6 m), weight 77.2 kg (170 lb 3.1 oz), SpO2 99 %.   Intake/Output Summary (Last 24 hours) at 11/06/16 1308 Last data filed at 11/06/16 1249  Gross per 24 hour  Intake            220.9 ml  Output               12 ml  Net            208.9 ml    Exam Awake Alert, Oriented x 3, No new F.N deficits, Normal affect [place].AT,PERRAL Supple Neck,No JVD, No cervical lymphadenopathy appriciated.  Symmetrical Chest wall movement, Good air movement bilaterally, CTAB RRR,No Gallops,Rubs or new Murmurs, No Parasternal Heave +ve B.Sounds, Abd Soft, Non tender, No organomegaly appriciated, No rebound  -guarding or rigidity. No Cyanosis, Clubbing or edema, No new Rash or bruise  Data Review   CBC  w Diff: Lab Results  Component Value Date   WBC 6.4 11/01/2016   HGB 9.8 (L) 11/01/2016   HGB 9.4 (L) 04/15/2014   HGB 11.7 04/28/2009   HCT 28.9 (L) 11/01/2016   HCT 27.8 (L) 04/15/2014   HCT 33.3 (L) 04/28/2009   PLT 190 11/01/2016   PLT 142 (L) 04/15/2014   PLT 254 04/28/2009   LYMPHOPCT 14 10/25/2016   LYMPHOPCT 8.5 04/15/2014   LYMPHOPCT 36.9 04/28/2009   MONOPCT 6 10/25/2016   MONOPCT 3.4 04/15/2014   MONOPCT 4.8 04/28/2009   EOSPCT 3 10/25/2016   EOSPCT 0.0 04/15/2014   EOSPCT 0.7 04/28/2009   BASOPCT 1 10/25/2016   BASOPCT 0.2 04/15/2014   BASOPCT 0.6 04/28/2009    CMP: Lab Results  Component Value Date   NA 143 11/06/2016   NA 140 04/16/2014   NA 139 03/18/2009   K 3.4 (L) 11/06/2016   K 3.7 04/16/2014   K 4.3 03/18/2009   CL 111 11/06/2016   CL 106 04/16/2014   CL 102 03/18/2009   CO2 28 11/06/2016   CO2 25 04/16/2014   CO2 27 03/18/2009   BUN 16 11/06/2016   BUN 36 (H) 04/16/2014   BUN 16 03/18/2009   CREATININE 0.86 11/06/2016   CREATININE 1.25 04/16/2014   CREATININE 1.0 03/18/2009   PROT 6.4 (L) 10/27/2016   PROT 5.6 (L) 04/09/2014   PROT 7.1 03/18/2009   ALBUMIN 3.2 (L) 10/27/2016   ALBUMIN 2.9 (L) 04/09/2014   BILITOT 0.7 10/27/2016   BILITOT 0.7 04/09/2014   BILITOT 0.50 03/18/2009   ALKPHOS 125 10/27/2016   ALKPHOS 56 04/09/2014   ALKPHOS 110 (H) 03/18/2009   AST 23 10/27/2016   AST 32 04/09/2014   AST 26 03/18/2009   ALT 19 10/27/2016   ALT 13 04/09/2014   ALT 17 03/18/2009  .   Total Time in preparing paper work, data evaluation and todays exam - 35 minutes  Misty Medina M.D on 11/06/2016 at 1:08 PM    Note: This dictation was prepared with Dragon dictation along with smaller phrase technology. Any transcriptional errors that result from this process are unintentional.

## 2016-11-06 NOTE — Clinical Social Work Placement (Signed)
   CLINICAL SOCIAL WORK PLACEMENT  NOTE  Date:  11/06/2016  Patient Details  Name: Misty Medina MRN: [id number] Date of Birth: 12/27/1940  Clinical Social Work is seeking post-discharge placement for this patient at the Skilled  Nursing Facility level of care (*CSW will initial, date and re-position this form in  chart as items are completed):  Yes   Patient/family provided with [facility] Clinical Social Work Department's list of facilities offering this level of care within the geographic area requested by the patient (or if unable, by the patient's family).  Yes   Patient/family informed of their freedom to choose among providers that offer the needed level of care, that participate in Medicare, Medicaid or managed care program needed by the patient, have an available bed and are willing to accept the patient.  Yes   Patient/family informed of [facility]'s ownership interest in [facility] and [facility], as well as of the fact that they are under no obligation to receive care at these facilities.  PASRR submitted to EDS on 11/02/16     PASRR number received on 11/06/16     Existing PASRR number confirmed on       FL2 transmitted to all facilities in geographic area requested by pt/family on 11/02/16     FL2 transmitted to all facilities within larger geographic area on       Patient informed that his/her managed care company has contracts with or will negotiate with certain facilities, including the following:        Yes   Patient/family informed of bed offers received.  Patient chooses bed at  [facility])     Physician recommends and patient chooses bed at  [facility])    Patient to be transferred to  ([facility]) on 11/06/16.  Patient to be transferred to facility by  (EMS)     Patient family notified on 11/06/16 of transfer.  Name of family member notified:   (patient has notified her daughter)     PHYSICIAN Please sign FL2, Please prepare  prescriptions, Please prepare priority discharge summary, including medications, Please sign DNR     Additional Comment:    _______________________________________________ York SpanielMonica Neaveh Belanger, LCSW 11/06/2016, 1:58 PM

## 2016-11-15 ENCOUNTER — Ambulatory Visit: Admit: 2016-11-15 | Payer: Medicare Other | Admitting: Ophthalmology

## 2016-11-15 SURGERY — PHACOEMULSIFICATION, CATARACT, WITH IOL INSERTION
Anesthesia: Choice | Laterality: Right

## 2016-11-27 ENCOUNTER — Other Ambulatory Visit: Payer: Self-pay | Admitting: Internal Medicine

## 2016-11-27 DIAGNOSIS — Z1231 Encounter for screening mammogram for malignant neoplasm of breast: Secondary | ICD-10-CM

## 2016-12-11 NOTE — H&P (Signed)
See scanned note.

## 2016-12-13 ENCOUNTER — Ambulatory Visit: Payer: Medicare Other | Admitting: Anesthesiology

## 2016-12-13 ENCOUNTER — Ambulatory Visit
Admission: RE | Admit: 2016-12-13 | Discharge: 2016-12-13 | Disposition: A | Discharge status: Home / Self Care | Payer: Medicare Other | Source: Ambulatory Visit | Attending: Ophthalmology | Admitting: Ophthalmology

## 2016-12-13 ENCOUNTER — Encounter: Payer: Self-pay | Admitting: *Deleted

## 2016-12-13 ENCOUNTER — Encounter
Admission: RE | Disposition: A | Discharge status: Home / Self Care | Payer: Self-pay | Source: Ambulatory Visit | Attending: Ophthalmology

## 2016-12-13 DIAGNOSIS — M797 Fibromyalgia: Secondary | ICD-10-CM | POA: Diagnosis not present

## 2016-12-13 DIAGNOSIS — D649 Anemia, unspecified: Secondary | ICD-10-CM | POA: Diagnosis not present

## 2016-12-13 DIAGNOSIS — I509 Heart failure, unspecified: Secondary | ICD-10-CM | POA: Insufficient documentation

## 2016-12-13 DIAGNOSIS — E039 Hypothyroidism, unspecified: Secondary | ICD-10-CM | POA: Insufficient documentation

## 2016-12-13 DIAGNOSIS — M1990 Unspecified osteoarthritis, unspecified site: Secondary | ICD-10-CM | POA: Insufficient documentation

## 2016-12-13 DIAGNOSIS — Z79899 Other long term (current) drug therapy: Secondary | ICD-10-CM | POA: Diagnosis not present

## 2016-12-13 DIAGNOSIS — I110 Hypertensive heart disease with heart failure: Secondary | ICD-10-CM | POA: Insufficient documentation

## 2016-12-13 DIAGNOSIS — H2511 Age-related nuclear cataract, right eye: Secondary | ICD-10-CM | POA: Diagnosis not present

## 2016-12-13 DIAGNOSIS — K219 Gastro-esophageal reflux disease without esophagitis: Secondary | ICD-10-CM | POA: Diagnosis not present

## 2016-12-13 DIAGNOSIS — K449 Diaphragmatic hernia without obstruction or gangrene: Secondary | ICD-10-CM | POA: Diagnosis not present

## 2016-12-13 DIAGNOSIS — F329 Major depressive disorder, single episode, unspecified: Secondary | ICD-10-CM | POA: Diagnosis not present

## 2016-12-13 HISTORY — DX: Unspecified osteoarthritis, unspecified site: M19.90

## 2016-12-13 HISTORY — DX: Dyspnea, unspecified: R06.00

## 2016-12-13 HISTORY — DX: Pneumonia, unspecified organism: J18.9

## 2016-12-13 HISTORY — DX: Unspecified hearing loss, unspecified ear: H91.90

## 2016-12-13 HISTORY — DX: Anemia, unspecified: D64.9

## 2016-12-13 HISTORY — DX: Personal history of other diseases of the digestive system: Z87.19

## 2016-12-13 HISTORY — DX: Irritable bowel syndrome, unspecified: K58.9

## 2016-12-13 HISTORY — PX: CATARACT EXTRACTION W/PHACO: SHX586

## 2016-12-13 HISTORY — DX: Pure hypercholesterolemia, unspecified: E78.00

## 2016-12-13 SURGERY — PHACOEMULSIFICATION, CATARACT, WITH IOL INSERTION
Anesthesia: Monitor Anesthesia Care | Site: Eye | Laterality: Right | Wound class: Clean

## 2016-12-13 MED ORDER — MOXIFLOXACIN HCL 0.5 % OP SOLN
OPHTHALMIC | Status: AC
  Administered 2016-12-13: 08:00:00
  Filled 2016-12-13: qty 3

## 2016-12-13 MED ORDER — BUPIVACAINE HCL (PF) 0.75 % IJ SOLN
INTRAMUSCULAR | Status: AC
  Filled 2016-12-13: qty 10

## 2016-12-13 MED ORDER — NA CHONDROIT SULF-NA HYALURON 40-17 MG/ML IO SOLN
INTRAOCULAR | Status: DC | PRN
  Administered 2016-12-13: 1 mL via INTRAOCULAR

## 2016-12-13 MED ORDER — TETRACAINE HCL 0.5 % OP SOLN
OPHTHALMIC | Status: AC
  Filled 2016-12-13: qty 2

## 2016-12-13 MED ORDER — POVIDONE-IODINE 5 % OP SOLN
OPHTHALMIC | Status: AC
  Filled 2016-12-13: qty 30

## 2016-12-13 MED ORDER — SODIUM CHLORIDE 0.9 % IV SOLN
INTRAVENOUS | Status: DC
  Administered 2016-12-13: 08:00:00 via INTRAVENOUS

## 2016-12-13 MED ORDER — CARBACHOL 0.01 % IO SOLN
INTRAOCULAR | Status: DC | PRN
  Administered 2016-12-13: 0.5 mL via INTRAOCULAR

## 2016-12-13 MED ORDER — EPINEPHRINE PF 1 MG/ML IJ SOLN
INTRAMUSCULAR | Status: AC
  Filled 2016-12-13: qty 2

## 2016-12-13 MED ORDER — CYCLOPENTOLATE HCL 2 % OP SOLN
OPHTHALMIC | Status: AC
  Administered 2016-12-13: 08:00:00
  Filled 2016-12-13: qty 2

## 2016-12-13 MED ORDER — PHENYLEPHRINE HCL 10 % OP SOLN
OPHTHALMIC | Status: AC
  Administered 2016-12-13: 08:00:00
  Filled 2016-12-13: qty 5

## 2016-12-13 MED ORDER — LIDOCAINE HCL (PF) 4 % IJ SOLN
INTRAOCULAR | Status: DC | PRN
  Administered 2016-12-13: 4 mL via OPHTHALMIC

## 2016-12-13 MED ORDER — POVIDONE-IODINE 5 % OP SOLN
OPHTHALMIC | Status: DC | PRN
  Administered 2016-12-13: 1 via OPHTHALMIC

## 2016-12-13 MED ORDER — MIDAZOLAM HCL 2 MG/2ML IJ SOLN
INTRAMUSCULAR | Status: DC | PRN
  Administered 2016-12-13: .5 mg via INTRAVENOUS

## 2016-12-13 MED ORDER — CEFUROXIME OPHTHALMIC INJECTION 1 MG/0.1 ML
INJECTION | OPHTHALMIC | Status: AC
  Filled 2016-12-13: qty 0.1

## 2016-12-13 MED ORDER — ALFENTANIL 500 MCG/ML IJ INJ
INJECTION | INTRAMUSCULAR | Status: DC | PRN
  Administered 2016-12-13: 500 ug via INTRAVENOUS

## 2016-12-13 MED ORDER — EPINEPHRINE PF 1 MG/ML IJ SOLN
INTRAOCULAR | Status: DC | PRN
  Administered 2016-12-13: 1 mL via OPHTHALMIC

## 2016-12-13 MED ORDER — TETRACAINE HCL 0.5 % OP SOLN
OPHTHALMIC | Status: DC | PRN
  Administered 2016-12-13: 1 [drp] via OPHTHALMIC

## 2016-12-13 MED ORDER — MOXIFLOXACIN HCL 0.5 % OP SOLN: 1.0000 [drp] | OPHTHALMIC | Status: AC | PRN

## 2016-12-13 MED ORDER — NA CHONDROIT SULF-NA HYALURON 40-17 MG/ML IO SOLN
INTRAOCULAR | Status: AC
  Filled 2016-12-13: qty 1

## 2016-12-13 MED ORDER — CYCLOPENTOLATE HCL 2 % OP SOLN: 1.0000 [drp] | OPHTHALMIC | Status: AC | PRN

## 2016-12-13 MED ORDER — PHENYLEPHRINE HCL 10 % OP SOLN: 1.0000 [drp] | OPHTHALMIC | Status: AC | PRN

## 2016-12-13 MED ORDER — HYALURONIDASE HUMAN 150 UNIT/ML IJ SOLN
INTRAMUSCULAR | Status: AC
  Filled 2016-12-13: qty 1

## 2016-12-13 MED ORDER — LIDOCAINE HCL (PF) 4 % IJ SOLN
INTRAMUSCULAR | Status: DC | PRN
  Administered 2016-12-13: 9 mL via OPHTHALMIC

## 2016-12-13 MED ORDER — MOXIFLOXACIN HCL 0.5 % OP SOLN
OPHTHALMIC | Status: DC | PRN
  Administered 2016-12-13: 0.2 mL via OPHTHALMIC

## 2016-12-13 SURGICAL SUPPLY — 30 items
CANNULA ANT/CHMB 27GA (MISCELLANEOUS) ×3 IMPLANT
CORD BIP STRL DISP 12FT (MISCELLANEOUS) ×3 IMPLANT
CUP MEDICINE 2OZ PLAST GRAD ST (MISCELLANEOUS) ×3 IMPLANT
DRAPE XRAY CASSETTE 23X24 (DRAPES) ×3 IMPLANT
ERASER HMR WETFIELD 18G (MISCELLANEOUS) ×3 IMPLANT
GLOVE BIO SURGEON STRL SZ8 (GLOVE) ×3 IMPLANT
GLOVE SURG LX 6.5 MICRO (GLOVE) ×2
GLOVE SURG LX 8.0 MICRO (GLOVE) ×2
GLOVE SURG LX STRL 6.5 MICRO (GLOVE) ×1 IMPLANT
GLOVE SURG LX STRL 8.0 MICRO (GLOVE) ×1 IMPLANT
GOWN STRL REUS W/ TWL LRG LVL3 (GOWN DISPOSABLE) ×1 IMPLANT
GOWN STRL REUS W/ TWL XL LVL3 (GOWN DISPOSABLE) ×1 IMPLANT
GOWN STRL REUS W/TWL LRG LVL3 (GOWN DISPOSABLE) ×2
GOWN STRL REUS W/TWL XL LVL3 (GOWN DISPOSABLE) ×2
LENS IOL ACRYSOF IQ 20.0 (Intraocular Lens) ×3 IMPLANT
PACK CATARACT (MISCELLANEOUS) ×3 IMPLANT
PACK CATARACT DINGLEDEIN LX (MISCELLANEOUS) ×3 IMPLANT
PACK EYE AFTER SURG (MISCELLANEOUS) ×3 IMPLANT
SHLD EYE VISITEC  UNIV (MISCELLANEOUS) ×3 IMPLANT
SOL BSS BAG (MISCELLANEOUS) ×3
SOL PREP PVP 2OZ (MISCELLANEOUS) ×3
SOLUTION BSS BAG (MISCELLANEOUS) ×1 IMPLANT
SOLUTION PREP PVP 2OZ (MISCELLANEOUS) ×1 IMPLANT
SUT ETHILON 10 0 CS140 6 (SUTURE) ×3 IMPLANT
SUT SILK 5-0 (SUTURE) ×3 IMPLANT
SYR 3ML LL SCALE MARK (SYRINGE) ×3 IMPLANT
SYR 5ML LL (SYRINGE) ×3 IMPLANT
SYR TB 1ML 27GX1/2 LL (SYRINGE) ×3 IMPLANT
WATER STERILE IRR 250ML POUR (IV SOLUTION) ×3 IMPLANT
WIPE NON LINTING 3.25X3.25 (MISCELLANEOUS) ×3 IMPLANT

## 2016-12-13 NOTE — Op Note (Signed)
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 Item Serial No. Manufacturer Lot No. LRB No. Used  LENS IOL ACRYSOF IQ 20.0 - [id number] Intraocular Lens LENS IOL ACRYSOF IQ 20.0 [id number] 118 ALCON   Right 1   Anesthesia: 2% Lidocaine and 4% Marcaine in a 50/50 mixture with 10 unites/ml of Hylenex given as a peribulbar Anesthesiologist: Anesthesiologist: Berdine AddisonMathai Thomas, MD CRNA: Darrol Jumpavid Marion, CRNA Complications: none Estimated Blood Loss: less than 1 ml  Description of procedure:  The patient was given anesthesia and sedation via intravenous access. The patient was then prepped and draped in the usual fashion. A 25-gauge needle was bent for initiating the capsulorhexis. A 5-0 silk suture was placed through the conjunctiva superior and inferiorly to serve as bridle sutures. Hemostasis was obtained at the superior limbus using an eraser cautery. A partial thickness groove was made at the anterior surgical limbus with a 64 Beaver blade and this was dissected anteriorly with an SYSCOlcon Crescent knife. The anterior chamber was entered at 10 o'clock with a 1.0 mm paracentesis knife and through the lamellar dissection with a 2.6 mm Alcon keratome. Epi-Shugarcaine 0.5 CC [9 cc BSS Plus (Alcon), 3 cc 4% preservative-free lidocaine (Hospira) and 4 cc 1:1000 preservative-free, bisulfite-free epinephrine] was injected into the anterior chamber via the paracentesis tract. Epi-Shugarcaine 0.5 CC [9 cc BSS Plus (Alcon), 3 cc 4% preservative-free lidocaine (Hospira) and 4 cc 1:1000 preservative-free, bisulfite-free epinephrine] was injected into the anterior chamber via the paracentesis tract. DiscoVisc was injected to  replace the aqueous and a continuous tear curvilinear capsulorhexis was performed using a bent 25-gauge needle.  Balance salt on a syringe was used to perform hydro-dissection and phacoemulsification was carried out using a divide and conquer technique. Procedure(s) with comments: CATARACT EXTRACTION PHACO AND INTRAOCULAR LENS PLACEMENT (IOC) (Right) - US 2:03.2 AP% 24.5 CDE 51.11 Fluid Pack lot # [id number]. Irrigation/aspiration was used to remove the residual cortex and the capsular bag was inflated with DiscoVisc. The intraocular lens was inserted into the capsular bag using a pre-loaded UltraSert Delivery System. Irrigation/aspiration was used to remove the residual DiscoVisc. The wound was inflated with balanced salt and checked for leaks. None were found. Miostat was injected via the paracentesis track and 0.1 ml of Vigamox containing 1 mg of drug  was injected via the paracentesis track. The wound was checked for leaks again and none were found.   The bridal sutures were removed and two drops of Vigamox were placed on the eye. An eye shield was placed to protect the eye and the patient was discharged to the recovery area in good condition.   Sayward Horvath MD

## 2016-12-13 NOTE — Transfer of Care (Signed)
Immediate Anesthesia Transfer of Care Note  Patient: Misty Medina  Procedure(s) Performed: Procedure(s) with comments: CATARACT EXTRACTION PHACO AND INTRAOCULAR LENS PLACEMENT (IOC) (Right) - [place] 2:03.2 AP% 24.5 CDE 51.11 Fluid Pack lot # [id number]  Patient Location: PACU  Anesthesia Type:MAC  Level of Consciousness: awake, alert , oriented and patient cooperative  Airway & Oxygen Therapy: Patient Spontanous Breathing  Post-op Assessment: Report given to RN and Post -op Vital signs reviewed and stable  Post vital signs: Reviewed and stable  Last Vitals:  Vitals:   12/13/16 0737  BP: (!) 151/91  Pulse: 82  Resp: 18  Temp: 36.9 C    Last Pain:  Vitals:   12/13/16 0737  TempSrc: Oral         Complications: No apparent anesthesia complications

## 2016-12-13 NOTE — Anesthesia Preprocedure Evaluation (Addendum)
Anesthesia Evaluation  Patient identified by MRN, date of birth, ID band Patient awake    Reviewed: Allergy & Precautions, NPO status , Patient's Chart, lab work & pertinent test results, reviewed documented beta blocker date and time   Airway Mallampati: II  TM Distance: >3 FB     Dental  (+) Chipped, Upper Dentures, Lower Dentures   Pulmonary shortness of breath, pneumonia, resolved,           Cardiovascular hypertension, Pt. on medications and Pt. on home beta blockers +CHF       Neuro/Psych PSYCHIATRIC DISORDERS Depression    GI/Hepatic hiatal hernia, PUD, GERD  Controlled,  Endo/Other  Hypothyroidism   Renal/GU Renal InsufficiencyRenal disease     Musculoskeletal  (+) Arthritis , Fibromyalgia -  Abdominal   Peds  Hematology  (+) anemia ,   Anesthesia Other Findings No problem with heart valves, except for murmur in the past.  Reproductive/Obstetrics                            Anesthesia Physical Anesthesia Plan  ASA: III  Anesthesia Plan: MAC   Post-op Pain Management:    Induction:   Airway Management Planned:   Additional Equipment:   Intra-op Plan:   Post-operative Plan:   Informed Consent: I have reviewed the patients History and Physical, chart, labs and discussed the procedure including the risks, benefits and alternatives for the proposed anesthesia with the patient or authorized representative who has indicated his/her understanding and acceptance.     Plan Discussed with: CRNA  Anesthesia Plan Comments:         Anesthesia Quick Evaluation

## 2016-12-13 NOTE — Anesthesia Postprocedure Evaluation (Signed)
Anesthesia Post Note  Patient: Misty Medina  Procedure(s) Performed: Procedure(s) (LRB): CATARACT EXTRACTION PHACO AND INTRAOCULAR LENS PLACEMENT (IOC) (Right)  Patient location during evaluation: PACU Anesthesia Type: MAC Level of consciousness: awake and alert Pain management: pain level controlled Vital Signs Assessment: post-procedure vital signs reviewed and stable Respiratory status: spontaneous breathing, nonlabored ventilation, respiratory function stable and patient connected to nasal cannula oxygen Cardiovascular status: stable and blood pressure returned to baseline Anesthetic complications: no     Last Vitals:  Vitals:   12/13/16 0916 12/13/16 1013  BP: (!) 143/78 (!) 162/89  Pulse: 75 81  Resp:    Temp: 36.1 C     Last Pain:  Vitals:   12/13/16 0737  TempSrc: Oral                 [name]

## 2016-12-13 NOTE — Anesthesia Post-op Follow-up Note (Cosign Needed)
Anesthesia QCDR form completed.        

## 2016-12-13 NOTE — Interval H&P Note (Signed)
History and Physical Interval Note:  12/13/2016 7:27 AM  Misty Medina  has presented today for surgery, with the diagnosis of CATARACT  The various methods of treatment have been discussed with the patient and family. After consideration of risks, benefits and other options for treatment, the patient has consented to  Procedure(s): CATARACT EXTRACTION PHACO AND INTRAOCULAR LENS PLACEMENT (IOC) (Right) as a surgical intervention .  The patient's history has been reviewed, patient examined, no change in status, stable for surgery.  I have reviewed the patient's chart and labs.  Questions were answered to the patient's satisfaction.     Zaira Iacovelli

## 2016-12-13 NOTE — Discharge Instructions (Addendum)
Eye Surgery Discharge Instructions  Expect mild scratchy sensation or mild soreness. DO NOT RUB YOUR EYE!  The day of surgery:  Minimal physical activity, but bed rest is not required  No reading, computer work, or close hand work  No bending, lifting, or straining.  May watch TV  For 24 hours:  No driving, legal decisions, or alcoholic beverages  Safety precautions  Eat anything you prefer: It is better to start with liquids, then soup then solid foods.  _____ Eye patch should be worn until postoperative exam tomorrow.  ____ Solar shield eyeglasses should be worn for comfort in the sunlight/patch while sleeping  Resume all regular medications including aspirin or Coumadin if these were discontinued prior to surgery. You may shower, bathe, shave, or wash your hair. Tylenol may be taken for mild discomfort.  Call your doctor if you experience significant pain, nausea, or vomiting, fever > 101 or other signs of infection. [phone] or [phone] Specific instructions:  Follow-up Information    DINGELDEIN,STEVEN, MD Follow up.   Specialty:  Ophthalmology Why:  12-14-16 at 8:25 Contact information: [address]   [place] [address] [phone]          Eye Surgery Discharge Instructions  Expect mild scratchy sensation or mild soreness. DO NOT RUB YOUR EYE!  The day of surgery:  Minimal physical activity, but bed rest is not required  No reading, computer work, or close hand work  No bending, lifting, or straining.  May watch TV  For 24 hours:  No driving, legal decisions, or alcoholic beverages  Safety precautions  Eat anything you prefer: It is better to start with liquids, then soup then solid foods.  _____ Eye patch should be worn until postoperative exam tomorrow.  ____ Solar shield eyeglasses should be worn for comfort in the sunlight/patch while sleeping  Resume all regular medications including aspirin or Coumadin if these  were discontinued prior to surgery. You may shower, bathe, shave, or wash your hair. Tylenol may be taken for mild discomfort.  Call your doctor if you experience significant pain, nausea, or vomiting, fever > 101 or other signs of infection. [phone] or [phone] Specific instructions:  Follow-up Information    DINGELDEIN,STEVEN, MD Follow up.   Specialty:  Ophthalmology Why:  12-14-16 at 8:25 Contact information: [address]   [place] [address] [phone]         See handout.

## 2016-12-19 ENCOUNTER — Ambulatory Visit: Payer: Medicare Other

## 2017-01-03 ENCOUNTER — Ambulatory Visit: Payer: Medicare Other

## 2017-01-12 ENCOUNTER — Emergency Department
Admission: EM | Admit: 2017-01-12 | Discharge: 2017-01-28 | Disposition: E | Payer: Medicare Other | Attending: Student in an Organized Health Care Education/Training Program | Admitting: Student in an Organized Health Care Education/Training Program

## 2017-01-12 DIAGNOSIS — I469 Cardiac arrest, cause unspecified: Secondary | ICD-10-CM | POA: Diagnosis present

## 2017-01-12 DIAGNOSIS — E039 Hypothyroidism, unspecified: Secondary | ICD-10-CM | POA: Diagnosis not present

## 2017-01-12 DIAGNOSIS — Z79899 Other long term (current) drug therapy: Secondary | ICD-10-CM | POA: Diagnosis not present

## 2017-01-12 DIAGNOSIS — I10 Essential (primary) hypertension: Secondary | ICD-10-CM | POA: Diagnosis not present

## 2017-01-12 MED ORDER — SODIUM BICARBONATE 8.4 % IV SOLN
INTRAVENOUS | Status: AC | PRN
  Administered 2017-01-12 (×2): 50 meq via INTRAVENOUS

## 2017-01-12 MED ORDER — SODIUM CHLORIDE 0.9 % IV SOLN
INTRAVENOUS | Status: AC | PRN
  Administered 2017-01-12: 1000 mL via INTRAVENOUS

## 2017-01-12 MED ORDER — EPINEPHRINE PF 1 MG/10ML IJ SOSY
PREFILLED_SYRINGE | INTRAMUSCULAR | Status: AC | PRN
  Administered 2017-01-12 (×3): 1 mg via INTRAVENOUS

## 2017-01-13 DIAGNOSIS — I469 Cardiac arrest, cause unspecified: Secondary | ICD-10-CM | POA: Diagnosis not present

## 2017-01-13 LAB — GLUCOSE, CAPILLARY: Glucose-Capillary: 184 mg/dL — ABNORMAL HIGH (ref 65–99)

## 2017-01-15 MED FILL — Medication: Qty: 1 | Status: AC

## 2017-01-28 DIAGNOSIS — 419620001 Death: Secondary | SNOMED CT | POA: Diagnosis not present

## 2017-01-28 NOTE — ED Notes (Signed)
Pulse check:  Slight and irregular bilateral femoral pulses

## 2017-01-28 NOTE — ED Provider Notes (Signed)
[facility] Emergency Department Provider Note    First MD Initiated Contact with Patient 01/02/2017 2355     (approximate)  I have reviewed the triage vital signs and the nursing notes.   HISTORY  Chief Complaint Respiratory Distress  Level V Caveat:  CPR in progress  HPI Misty Medina is a 76 y.o. female who presents with active CPR and being bagged by EMS. According to the EMS report the patient was called for transport to the ER due to nausea and "feeling unwell "this entire afternoon. They're initial EKG showed no evidence of acute ischemia. She was having large coffee ground emesis. She was very pale and frail-appearing. In transport to the ambulance the patient became unresponsive and had complete loss of pulses. At that point CPR was started the patient was transferred to the ambulance. She had a supraglottic airway device placed in field. 2 rounds of epinephrine were given. She is given 500 cc of normal saline in route. Transport was roughly 20 month minutes with asystole on the monitor.   Past Medical History:  Diagnosis Date  . Anemia    with blood transfusions  . Arthritis   . Depression   . Dyspnea   . Fibromyalgia   . GERD (gastroesophageal reflux disease)   . Heart murmur   . History of hiatal hernia   . HOH (hard of hearing)    mild  . Hypercholesterolemia   . Hypertension   . Hypothyroidism   . IBS (irritable bowel syndrome)   . Osteoporosis   . Pneumonia    in past   Family History  Problem Relation Age of Onset  . Hypertension Mother   . Osteoporosis Mother   . Arthritis Father   . Heart attack Father    Past Surgical History:  Procedure Laterality Date  . ABDOMINAL HYSTERECTOMY    . CATARACT EXTRACTION W/PHACO Right 12/13/2016   Procedure: CATARACT EXTRACTION PHACO AND INTRAOCULAR LENS PLACEMENT (IOC);  Surgeon: Sallee LangeSteven Dingeldein, MD;  Location: [facility] ORS;  Service: Ophthalmology;  Laterality: Right;  US 2:03.2 AP%  24.5 CDE 51.11 Fluid Pack lot # [id number]  . CHOLECYSTECTOMY    . ESOPHAGOGASTRODUODENOSCOPY (EGD) WITH PROPOFOL N/A 08/29/2016   Procedure: ESOPHAGOGASTRODUODENOSCOPY (EGD) WITH PROPOFOL;  Surgeon: Ula Lingoichard Lee, MD;  Location[facility] END[facility];  Service: Endoscopy;  Laterality: N/A;   Patient Active Problem List   Diagnosis Date Noted  . Fibromyalgia   . Palliative care encounter   . Goals of care, counseling/discussion   . Acute kidney injury (HCC) 10/26/2016  . Nausea vomiting and diarrhea 10/25/2016  . Acute on chronic renal failure (HCC) 10/25/2016  . AKI (acute kidney injury) (HCC) 10/25/2016  . Gastric ulcer 09/02/2016  . E. coli UTI 09/02/2016  . Aspiration pneumonia (HCC) 09/02/2016  . Acute blood loss anemia 09/02/2016  . Hypoxia 09/02/2016  . GIB (gastrointestinal bleeding) 08/28/2016  . Fracture of fifth toe, left, closed 09/16/2015  . Blister of foot without infection 09/16/2015  . Constipation 09/16/2015  . Falls 09/15/2015  . Frequent falls 09/14/2015  . Foot fracture, left 09/14/2015  . Hypothyroidism 09/14/2015  . Chronic systolic CHF (congestive heart failure) (HCC) 09/14/2015  . HTN (hypertension) 09/14/2015  . GERD (gastroesophageal reflux disease) 09/14/2015  . Severe recurrent major depressive disorder with psychotic features (HCC) 09/14/2015      Prior to Admission medications   Medication Sig Start Date End Date Taking? Authorizing Provider  ARIPiprazole (ABILIFY) 2 MG tablet Take 1 tablet (2 mg  total) by mouth daily. 11/07/16   Katha Hamming, MD  b complex vitamins tablet Take 1 tablet by mouth daily.    Historical Provider, MD  baclofen (LIORESAL) 10 MG tablet Take 5 mg by mouth 3 (three) times daily.     Historical Provider, MD  Biotin 1000 MCG tablet Take 1,000 mcg by mouth daily.    Historical Provider, MD  busPIRone (BUSPAR) 15 MG tablet Take 15 mg by mouth 2 (two) times daily.    Historical Provider, MD  carvedilol (COREG) 6.25 MG tablet Take  6.25 mg by mouth 2 (two) times daily with a meal.    Historical Provider, MD  chlorthalidone (HYGROTON) 25 MG tablet Take 0.5 tablets (12.5 mg total) by mouth every other day. 09/16/15   Katharina Caper, MD  Cholecalciferol (VITAMIN D-3) 1000 UNITS CAPS Take 1,000 Units by mouth daily.    Historical Provider, MD  citalopram (CELEXA) 40 MG tablet Take 40 mg by mouth daily.    Historical Provider, MD  DUREZOL 0.05 % EMUL Place 1 drop into both eyes daily. 09/28/16   Historical Provider, MD  ferrous fumarate-iron polysaccharide complex (TANDEM) 162-115.2 MG CAPS capsule Take 1 capsule by mouth daily.    Historical Provider, MD  HYDROcodone-acetaminophen (NORCO) 10-325 MG tablet Take 1 tablet by mouth every 6 (six) hours as needed.    Historical Provider, MD  ILEVRO 0.3 % ophthalmic suspension Place 1 drop into both eyes daily. 09/28/16   Historical Provider, MD  ipratropium-albuterol (DUONEB) 0.5-2.5 (3) MG/3ML SOLN Take 3 mLs by nebulization every 4 (four) hours. 11/06/16   Katha Hamming, MD  levofloxacin (LEVAQUIN) 500 MG tablet Take 1 tablet (500 mg total) by mouth daily. Patient not taking: Reported on 12/13/2016 11/06/16   Katha Hamming, MD  levothyroxine (SYNTHROID, LEVOTHROID) 75 MCG tablet Take 75 mcg by mouth daily.    Historical Provider, MD  lisinopril (PRINIVIL,ZESTRIL) 5 MG tablet Take 5 mg by mouth every morning. 10/04/16   Historical Provider, MD  loratadine (CLARITIN) 10 MG tablet Take 10 mg by mouth daily.    Historical Provider, MD  magnesium oxide (MAG-OX) 400 MG tablet Take 400 mg by mouth 2 (two) times daily.     Historical Provider, MD  mirtazapine (REMERON) 7.5 MG tablet Take 1 tablet (7.5 mg total) by mouth at bedtime. 11/06/16   Katha Hamming, MD  ondansetron (ZOFRAN) 4 MG tablet Take 1 tablet (4 mg total) by mouth every 6 (six) hours as needed for nausea. 09/02/16   Srikar Sudini, MD  pantoprazole (PROTONIX) 40 MG tablet Take 1 tablet (40 mg total) by mouth 2 (two)  times daily before a meal. Patient taking differently: Take 40 mg by mouth daily.  09/02/16   Srikar Sudini, MD  predniSONE (STERAPRED UNI-PAK 21 TAB) 10 MG (21) TBPK tablet Take 1 tablet (10 mg total) by mouth daily. Take as prescribed Patient not taking: Reported on 12/13/2016 11/06/16   Katha Hamming, MD  pregabalin (LYRICA) 25 MG capsule Take 25 mg by mouth 2 (two) times daily.    Historical Provider, MD  rOPINIRole (REQUIP) 0.5 MG tablet Take 1 tablet by mouth every evening. 10/04/16   Historical Provider, MD  tamsulosin (FLOMAX) 0.4 MG CAPS capsule Take 1 capsule (0.4 mg total) by mouth daily. 11/07/16   Katha Hamming, MD  tolterodine (DETROL LA) 4 MG 24 hr capsule Take 4 mg by mouth daily.    Historical Provider, MD  zolpidem (AMBIEN) 5 MG tablet Take 5 mg by mouth at  bedtime as needed for sleep.    Historical Provider, MD    Allergies Codeine and Penicillins    Social History Social History  Substance Use Topics  . Smoking status: Never Smoker  . Smokeless tobacco: Never Used  . Alcohol use No    Review of Systems Unable to obtain due to CPR in progress ____________________________________________   PHYSICAL EXAM:  VITAL SIGNS: There were no vitals filed for this visit.  Constitutional: critically ill appearing,  supraglottic device in place.  Active bagging and CPR Eyes: Conjunctivae are normal. Pupils 4mm and non reactive Head: Atraumatic. Mouth/Throat: supraglottic device in place, coffee ground emesis from oropharynx Neck: No stridor. Painless ROM. No cervical spine tenderness to palpation Cardiovascular: pulseless Respiratory: Breathsound equal bilaterally with baggin Gastrointestinal: Soft and nontender. No distention. No abdominal bruits.  Musculoskeletal:no LE edema.  Right tibial IO inplace Neurologic:  GCS 3t Skin:  Skin is cold and pale ____________________________________________   LABS (all labs ordered are listed, but only abnormal results are  displayed)  No results found for this or any previous visit (from the past 24 hour(s)). ____________________________________________  EKG____________________________________________  RADIOLOGY   ____________________________________________   PROCEDURES  Procedure(s) performed:  Procedures    Critical Care performed: yes CRITICAL CARE Performed by: Willy Eddy   Total critical care time: 30 minutes  Critical care time was exclusive of separately billable procedures and treating other patients.  Critical care was necessary to treat or prevent imminent or life-threatening deterioration.  Critical care was time spent personally by me on the following activities: development of treatment plan with patient and/or surrogate as well as nursing, discussions with consultants, evaluation of patient's response to treatment, examination of patient, obtaining history from patient or surrogate, ordering and performing treatments and interventions, ordering and review of laboratory studies, ordering and review of radiographic studies, pulse oximetry and re-evaluation of patient's condition.  ____________________________________________   INITIAL IMPRESSION / ASSESSMENT AND PLAN / ED COURSE  Pertinent labs & imaging results that were available during my care of the patient were reviewed by me and considered in my medical decision making (see chart for details).  DDX: gi bleed, hemorrhagic shock, acs, dysrhytmia, electrolyte abnormality  Misty Medina is a 76 y.o. who presents to the ED with cardiac arrest arrives to the ER critically ill with CPR in progress. Patient with 20 minutes of CPR with only asystole. Attempted continued high-quality CPR resuscitation efforts and ER with IV fluids, epinephrine, sodium bicarbonate, calcium. Point of care ultrasound at bedside shows no evidence of pericardial effusion. No evidence of pneumothorax. No free fluid in the abdomen. Based on the  coffee-ground emesis I'm concern for massive GI bleed. Patient with no evidence of neurologic function at this time. Due to persistent PA and asystole after prolonged resuscitative efforts I did determine to terminate resuscitative efforts at 1203 at which point the patient was pronounced dead. Support provided to family at bedside. Spoke with medical examiner who is deferred case.      ____________________________________________   FINAL CLINICAL IMPRESSION(S) / ED DIAGNOSES  Final diagnoses:  Cardiac arrest (HCC)      NEW MEDICATIONS STARTED DURING THIS VISIT:  New Prescriptions   No medications on file     Note:  This document was prepared using Dragon voice recognition software and may include unintentional dictation errors.    Willy Eddy, MD 12/31/2016 [phone]

## 2017-01-28 NOTE — ED Notes (Signed)
Patient placed in ER stretcher

## 2017-01-28 NOTE — ED Notes (Signed)
Family at bedside. 

## 2017-01-28 NOTE — ED Notes (Signed)
Pulse check:  No pulses detected

## 2017-01-28 NOTE — ED Triage Notes (Signed)
EMS brought patient in CPR in progress, They were dispatched to patients home d/t her vomiting for much of the day.  After the patient was put in the stretcher and placed in the ambulance she began having coffee ground emesis and they lost pulses and [place] [facility] [place] to initiate CPR.  [facility]  EMS states they gave her 500ml NS and 4mg  epi en route.  EMS has a 22 IO in bilateral tibia.

## 2017-01-28 NOTE — ED Notes (Signed)
Pulse check:  No pulses detected, PEA on monitor

## 2017-01-28 NOTE — ED Notes (Signed)
I spoke with family and gave them the options of 1) they can stay at bedside and wait for funeral home to call and collect patient or 2) go home and get some sleep and we will transport patient to the morgue and the funeral home will get her in the morning.  The family opted to go home

## 2017-01-28 NOTE — ED Notes (Signed)
TOD announced by Roxan Hockeyobinson, MD.

## 2017-01-28 NOTE — ED Notes (Signed)
Misty Medina and Misty Medina arrived to take patient to funeral home.

## 2017-01-28 NOTE — ED Notes (Signed)
PEA on monitor

## 2017-01-28 NOTE — ED Notes (Signed)
Spoke with Misty Medina at [facility], she is unable to get in touch with Misty Medina and Misty Medina.  She is trying to reach them to tell them that they can pick up the body of this patient as long as "they put her on ice" and do not embalm or "spray" her until the Misty Medina has called Misty Medina tomorrow.  Misty Medina states that the Misty Medina does not make recalls to the family, because they have already noted they will contact the family tomorrow at 12 noon.  At this time I am unable to reach Misty Medina, because the phone number I dial [phone] has a "all circuits are busy at this time"  I will update the family.  I did call the operator and they said there are no problems with phone lines in the area.

## 2017-01-28 DEATH — deceased

## 2017-10-09 IMAGING — DX DG CHEST 1V
1 series · 1 of 1 positions shown · non-contrast
Comparison: Prior radiograph from 10/26/2016.

CLINICAL DATA: Initial evaluation for acute shortness of breath.

EXAM:
CHEST 1 VIEW

[chest ap]
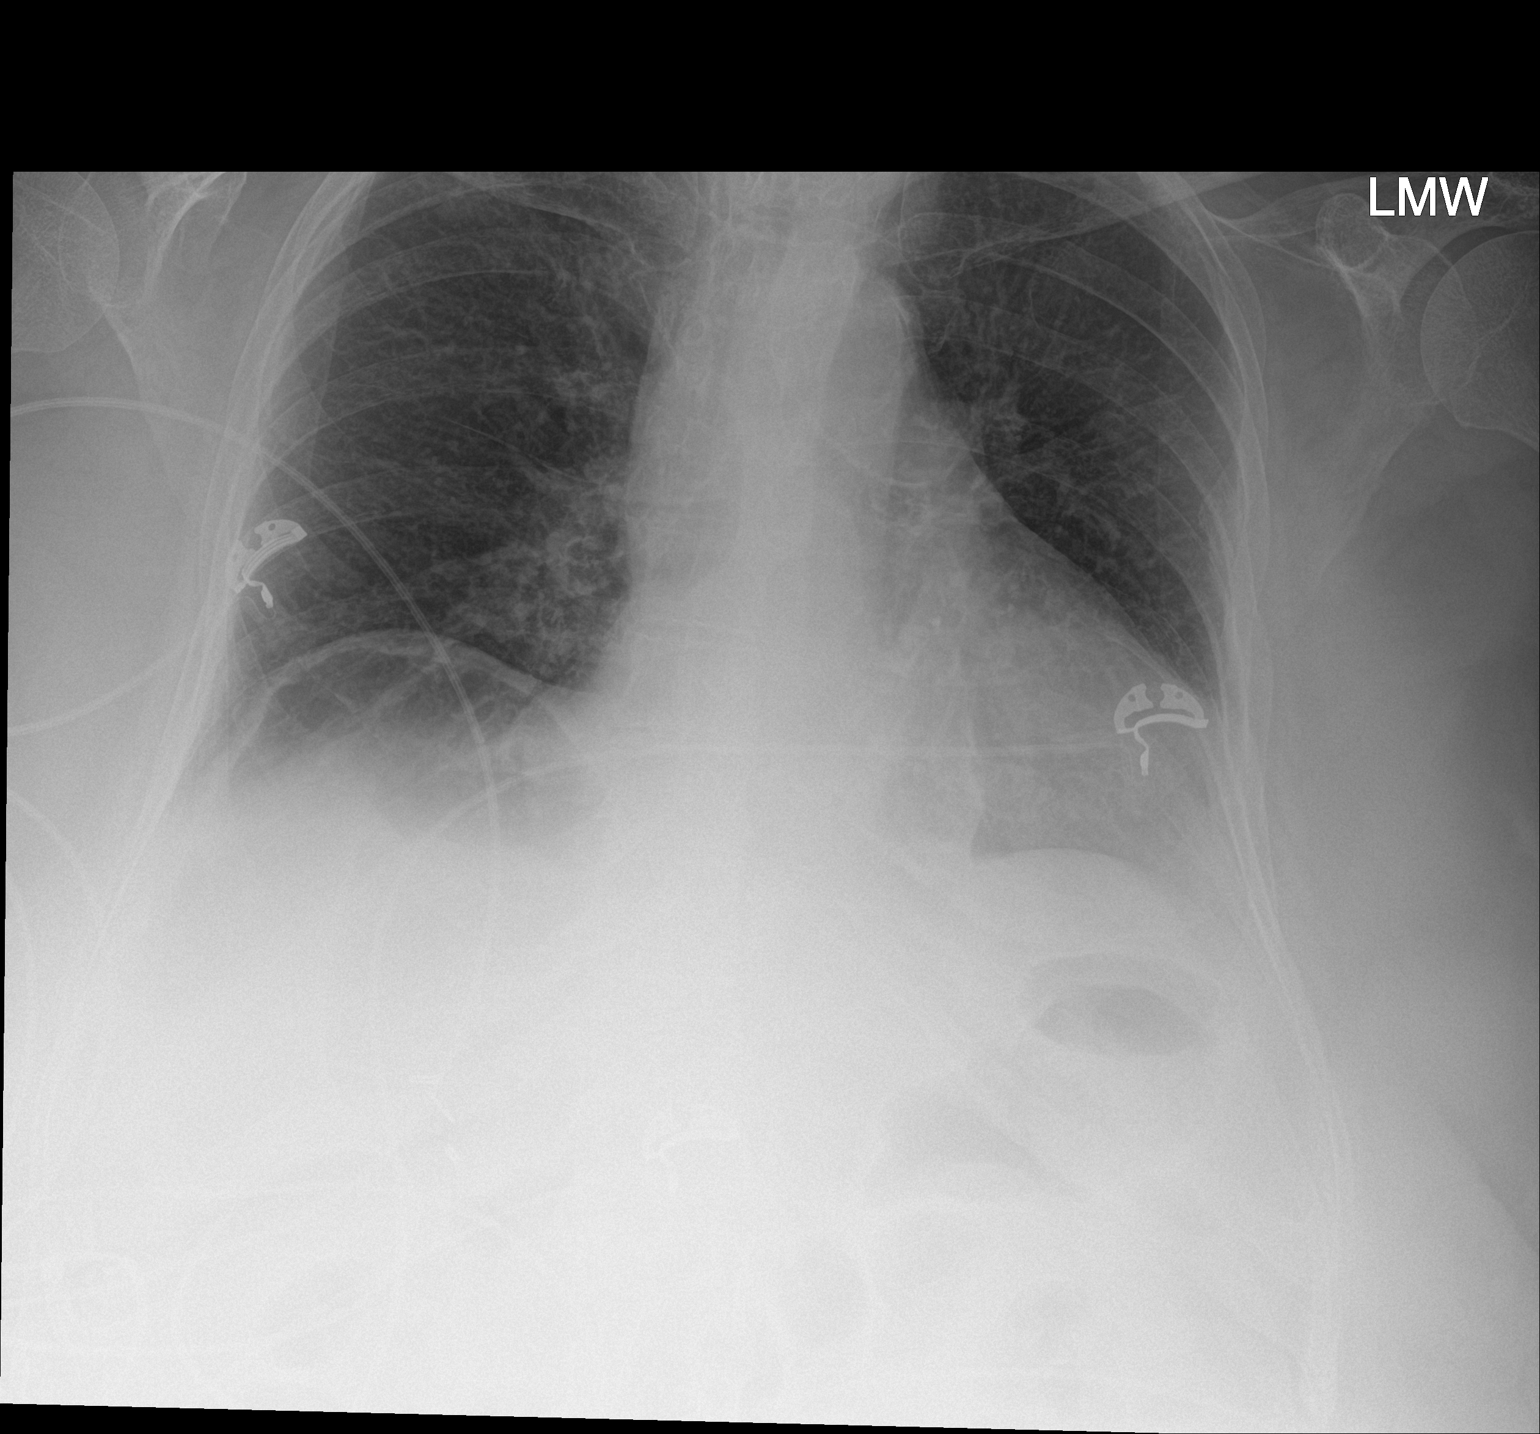

[1 of 1 positions shown; findings below may reference images not displayed]

FINDINGS: Moderate cardiomegaly, stable. Mediastinal silhouette within normal
limits. Aortic atherosclerosis noted.

Lungs hypoinflated. No focal infiltrates identified. No pulmonary
edema or pleural effusion. No pneumothorax.

No acute osseous abnormality.
IMPRESSION: 1. Shallow lung inflation with no active cardiopulmonary disease
identified.
2. Stable cardiomegaly.
3. Aortic atherosclerosis.
